# Patient Record
Sex: Female | Born: 1995
Health system: Southern US, Community
[De-identification: ages and names within clinical notes are randomized; demographics above are authoritative.]

## PROBLEM LIST (undated history)

## (undated) ENCOUNTER — Emergency Department (HOSPITAL_COMMUNITY): Admission: EM | Discharge status: Against Medical Advice | Payer: Self-pay

## (undated) DIAGNOSIS — F329 Major depressive disorder, single episode, unspecified: Secondary | ICD-10-CM

## (undated) DIAGNOSIS — F32A Depression, unspecified: Secondary | ICD-10-CM

## (undated) DIAGNOSIS — F1510 Other stimulant abuse, uncomplicated: Secondary | ICD-10-CM

## (undated) DIAGNOSIS — F259 Schizoaffective disorder, unspecified: Secondary | ICD-10-CM

## (undated) DIAGNOSIS — F419 Anxiety disorder, unspecified: Secondary | ICD-10-CM

## (undated) DIAGNOSIS — J45909 Unspecified asthma, uncomplicated: Secondary | ICD-10-CM

## (undated) DIAGNOSIS — F1210 Cannabis abuse, uncomplicated: Secondary | ICD-10-CM

## (undated) DIAGNOSIS — F250 Schizoaffective disorder, bipolar type: Secondary | ICD-10-CM

## (undated) DIAGNOSIS — F319 Bipolar disorder, unspecified: Secondary | ICD-10-CM

---
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
## 2015-03-02 DIAGNOSIS — F312 Bipolar disorder, current episode manic severe with psychotic features: Secondary | ICD-10-CM | POA: Diagnosis present

## 2015-03-02 DIAGNOSIS — F319 Bipolar disorder, unspecified: Secondary | ICD-10-CM

## 2015-06-18 DIAGNOSIS — Z9119 Patient's noncompliance with other medical treatment and regimen: Secondary | ICD-10-CM

## 2015-06-18 DIAGNOSIS — Z91199 Patient's noncompliance with other medical treatment and regimen due to unspecified reason: Secondary | ICD-10-CM

## 2015-07-02 DIAGNOSIS — F1210 Cannabis abuse, uncomplicated: Secondary | ICD-10-CM | POA: Insufficient documentation

## 2015-07-04 DIAGNOSIS — F1410 Cocaine abuse, uncomplicated: Secondary | ICD-10-CM | POA: Diagnosis present

## 2015-09-17 ENCOUNTER — Emergency Department (HOSPITAL_COMMUNITY)
Admission: EM | Admit: 2015-09-17 | Discharge: 2015-09-18 | Disposition: A | Discharge status: Home / Self Care | Attending: Emergency Medicine | Admitting: Emergency Medicine

## 2015-09-17 ENCOUNTER — Encounter (HOSPITAL_COMMUNITY): Payer: Self-pay | Admitting: Emergency Medicine

## 2015-09-17 DIAGNOSIS — F1410 Cocaine abuse, uncomplicated: Secondary | ICD-10-CM | POA: Insufficient documentation

## 2015-09-17 DIAGNOSIS — Z79899 Other long term (current) drug therapy: Secondary | ICD-10-CM | POA: Insufficient documentation

## 2015-09-17 DIAGNOSIS — R102 Pelvic and perineal pain: Secondary | ICD-10-CM | POA: Insufficient documentation

## 2015-09-17 DIAGNOSIS — F1210 Cannabis abuse, uncomplicated: Secondary | ICD-10-CM | POA: Insufficient documentation

## 2015-09-17 DIAGNOSIS — Z0000 Encounter for general adult medical examination without abnormal findings: Secondary | ICD-10-CM

## 2015-09-17 DIAGNOSIS — F17200 Nicotine dependence, unspecified, uncomplicated: Secondary | ICD-10-CM | POA: Insufficient documentation

## 2015-09-17 MED ORDER — AZITHROMYCIN 250 MG PO TABS
1000.0000 mg | ORAL_TABLET | Freq: Once | ORAL | Status: AC
Start: 2015-09-18 — End: 2015-09-18
  Administered 2015-09-18: 1000 mg via ORAL
  Filled 2015-09-17: qty 4

## 2015-09-17 NOTE — Discharge Instructions (Signed)
YOU ARE FOUND STABLE FOR DISCHARGE HOME AND INTO POLICE CUSTODY. A LIST OF REHAB PROVIDERS IS BELOW TO PURSE YOUR REQUEST FOR DRUG REHABILITATION.   Substance Abuse Treatment Programs  Intensive Outpatient Programs [facility]     [address]      [place], [place]                   [phone]       [facility] [address] [place], [place] [phone]  [facility]     (Inpatient and outpatient)     [address]           [phone]    [facility] [phone] (Suboxone and Methadone)  [address]      [place], [place] [address]      [phone]       [address] [place], [place] [phone]  [facility] (Outpatient/Inpatient, Chemical)    (insurance only) [phone]             [facility] (Groups & Residential) [place], [place] [phone]     [facility]     [address]     [place], [place]      [phone]       [facility] (for caregivers and family) [phone] [address] [place], [place] [phone]      Residential Treatment Programs [facility]      [address], [place], [place] [address]  [phone]       [facility] [address] [place], [place] [address] [phone]  [facility]        [phone]       [facility] [phone]  [facility] ([facility]             [address]                                         [place], [place]                                                [phone] or [phone]                               [facility] [place] [address] [place], [id number] [phone]  [facility]    [address] [place] [address]      [place], [place]     [phone]       [facility] [address] [place], [place] [phone]  [facility]   [address]     [place], [place] [address]     [phone]      Admissions: 8am-3pm M-F  [facility] ([facility]) [address] [place], [place] [phone]  [facility]: Residential Program [phone](90 Days)   [place], [place]      [phone] or [phone]     [facility]: [facility] [place], [place] (Walk in Hours over the weekend or by referral)  [facility] [address] [place], [place], [place] [address] [phone]  Crisis Mobile: [facility]:  [phone] (for crisis response 24 hours a day) [place]:      [phone] Outpatient Psychiatry and Counseling  [facility]: Mobile Crisis Management 24 hours:  [phone]  [facility] [employer] sliding scale fee and walk in schedule: M-F 8am-12pm/1pm-3pm [place], [place] [address] [place], [place] [address] [phone]  [facility] (Formerly known as [facility] [employer])- new patient walk-in appointments available Monday - Friday 8am -3pm.          [address] [place], [place] [address] [phone] or crisis line- [facility]/ Intensive Outpatient Therapy Program [place], [place] [address] [place]      [phone] [address] [place], [place] [address]                 [place]   [facility] [phone]. [place], [place] [address]   [employer] [facility] Care          [address] Johnette Abraham  [place], [place] [address]       [phone]  [facility] [address] [place], [place] [address] [phone]  [facility]    [address] [place], [place]  [address]     [place], [place] Joycelyn Man     [name] [address]     [phone]       [facility] [place] [address]  [facility]     [address] [place], [place], [place], [place] [address] [place]     [address]     [place], [place] [address]     [phone]       [facility] [address] [place], [place] [address] [phone] Resources for Temporary Residential Assistance/Crisis [place] [facility]) M-F 8am-3pm   [address] [place], [place] [address]   [phone] Services include: laundry, barbering, support groups, case management, phone  & computer access, showers, AA/NA mtgs, mental health/substance abuse nurse, job skills class, disability information, VA assistance, spiritual classes, etc.   [facility]   [address], [place]     [place]              [employer] (women and children)       [place]. [place], [place] [address] [phone] [email or web address] for application and process Application Required  [facility]   [address]    [place] [address]     [phone]                    [place],  [id number] [id number] application appt.) Application Required  [facility] (  women only)    [place], [place] [address]     [phone]      Intake starts 6pm daily Need valid ID, SSC, & Police report [employer] [address] [place], [place] [phone] Application Required  [employer] (men only)     [place].       [place], [place]       [place] (Pregnant women only) [address] [place], [place]  [facility]      [place] [address].      [place], [place] [address]     [phone]             [facility] [address] [place], [place] 90 day commitment/SA/Application process  [facility](men only)     [address]     [place], [place]       Check-in at [facility] [address] [place], [place] [address] [phone] Men/Women/Women and Children must be there by 7 pm  [place], [place]
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
## 2015-12-01 ENCOUNTER — Encounter (HOSPITAL_COMMUNITY): Payer: Self-pay | Admitting: Nurse Practitioner

## 2015-12-01 ENCOUNTER — Emergency Department (HOSPITAL_COMMUNITY)
Admission: EM | Admit: 2015-12-01 | Discharge: 2015-12-02 | Disposition: A | Discharge status: Other Acute Inpatient Hospital | Attending: Emergency Medicine | Admitting: Emergency Medicine

## 2015-12-01 DIAGNOSIS — R45851 Suicidal ideations: Secondary | ICD-10-CM | POA: Insufficient documentation

## 2015-12-01 DIAGNOSIS — F17200 Nicotine dependence, unspecified, uncomplicated: Secondary | ICD-10-CM | POA: Insufficient documentation

## 2015-12-01 DIAGNOSIS — Z79899 Other long term (current) drug therapy: Secondary | ICD-10-CM | POA: Insufficient documentation

## 2015-12-01 DIAGNOSIS — F22 Delusional disorders: Secondary | ICD-10-CM | POA: Insufficient documentation

## 2015-12-01 HISTORY — DX: Major depressive disorder, single episode, unspecified: F32.9

## 2015-12-01 HISTORY — DX: Depression, unspecified: F32.A

## 2015-12-01 LAB — CBC
HEMATOCRIT: 39.4 % (ref 36.0–46.0)
Hemoglobin: 12.8 g/dL (ref 12.0–15.0)
MCH: 32.7 pg (ref 26.0–34.0)
MCHC: 32.5 g/dL (ref 30.0–36.0)
MCV: 100.5 fL — ABNORMAL HIGH (ref 78.0–100.0)
Platelets: 243 10*3/uL (ref 150–400)
RBC: 3.92 MIL/uL (ref 3.87–5.11)
RDW: 15.1 % (ref 11.5–15.5)
WBC: 5.6 10*3/uL (ref 4.0–10.5)

## 2015-12-01 LAB — COMPREHENSIVE METABOLIC PANEL
ALBUMIN: 4.1 g/dL (ref 3.5–5.0)
ALT: 11 U/L — ABNORMAL LOW (ref 14–54)
ANION GAP: 7 (ref 5–15)
AST: 17 U/L (ref 15–41)
Alkaline Phosphatase: 38 U/L (ref 38–126)
BILIRUBIN TOTAL: 0.6 mg/dL (ref 0.3–1.2)
BUN: 11 mg/dL (ref 6–20)
CO2: 26 mmol/L (ref 22–32)
Calcium: 9.2 mg/dL (ref 8.9–10.3)
Chloride: 106 mmol/L (ref 101–111)
Creatinine, Ser: 0.76 mg/dL (ref 0.44–1.00)
GFR calc non Af Amer: >60 mL/min (ref >60)
GLUCOSE: 102 mg/dL — AB (ref 65–99)
Potassium: 4 mmol/L (ref 3.5–5.1)
SODIUM: 139 mmol/L (ref 135–145)
TOTAL PROTEIN: 7 g/dL (ref 6.5–8.1)

## 2015-12-01 LAB — RAPID URINE DRUG SCREEN, HOSP PERFORMED
Amphetamines: NOT DETECTED
BARBITURATES: NOT DETECTED
BENZODIAZEPINES: NOT DETECTED
COCAINE: NOT DETECTED
OPIATES: NOT DETECTED
Tetrahydrocannabinol: NOT DETECTED

## 2015-12-01 LAB — I-STAT BETA HCG BLOOD, ED (MC, WL, AP ONLY): I-stat hCG, quantitative: <5 m[IU]/mL (ref <5)

## 2015-12-01 LAB — ACETAMINOPHEN LEVEL

## 2015-12-01 LAB — ETHANOL: Alcohol, Ethyl (B): <5 mg/dL (ref <5)

## 2015-12-01 LAB — SALICYLATE LEVEL

## 2015-12-01 MED ORDER — BENZTROPINE MESYLATE 1 MG PO TABS
1.0000 mg | ORAL_TABLET | Freq: Two times a day (BID) | ORAL | Status: DC
  Administered 2015-12-02 (×2): 1 mg via ORAL
  Filled 2015-12-01 (×2): qty 1

## 2015-12-01 MED ORDER — ONDANSETRON HCL 4 MG PO TABS
4.0000 mg | ORAL_TABLET | Freq: Three times a day (TID) | ORAL | Status: DC | PRN
  Administered 2015-12-02: 4 mg via ORAL
  Filled 2015-12-01: qty 1

## 2015-12-01 MED ORDER — IBUPROFEN 400 MG PO TABS: 600.0000 mg | ORAL_TABLET | Freq: Three times a day (TID) | ORAL | Status: DC | PRN

## 2015-12-01 MED ORDER — LORAZEPAM 1 MG PO TABS: 1.0000 mg | ORAL_TABLET | Freq: Three times a day (TID) | ORAL | Status: DC | PRN

## 2015-12-01 MED ORDER — HALOPERIDOL 5 MG PO TABS
5.0000 mg | ORAL_TABLET | Freq: Two times a day (BID) | ORAL | Status: DC
  Administered 2015-12-02: 5 mg via ORAL
  Administered 2015-12-02: 10 mg via ORAL
  Filled 2015-12-01: qty 1
  Filled 2015-12-01: qty 2

## 2015-12-01 MED ORDER — ACETAMINOPHEN 325 MG PO TABS: 650.0000 mg | ORAL_TABLET | ORAL | Status: DC | PRN

## 2015-12-01 MED ORDER — NICOTINE 21 MG/24HR TD PT24
21.0000 mg | MEDICATED_PATCH | Freq: Every day | TRANSDERMAL | Status: DC
  Filled 2015-12-01: qty 1

## 2015-12-01 NOTE — ED Notes (Signed)
She c/o 1 week history of depression. She states she feels like she might not want to be here anymore, and she thought of hurting herself but did not do it. She denies thoughts of harming others. She denies any physical complaints. Reports hx of marijuana and cocaine use but not in past 2 months. She is alert, breathing easily

## 2015-12-01 NOTE — ED Notes (Signed)
Pt belongings given to mother. Belongings include mood ring, 1 set of small pearl appearing earrings and a red charger that pt states belongs to the mother.
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
## 2015-12-01 NOTE — ED Notes (Signed)
Family at bedside. 

## 2015-12-01 NOTE — ED Notes (Signed)
Pt appearing very shaky upon arrival to room B16. Patient belongings at nurse station. Mother arriving at bedside. Sitter at bedside.

## 2015-12-02 ENCOUNTER — Encounter (HOSPITAL_COMMUNITY): Payer: Self-pay | Admitting: *Deleted

## 2015-12-02 ENCOUNTER — Inpatient Hospital Stay (HOSPITAL_COMMUNITY)
Admission: AD | Admit: 2015-12-02 | Discharge: 2015-12-06 | DRG: 885 | Disposition: A | Discharge status: Home / Self Care | Payer: Federal, State, Local not specified - Other | Source: Intra-hospital | Attending: Psychiatry | Admitting: Psychiatry

## 2015-12-02 DIAGNOSIS — F1410 Cocaine abuse, uncomplicated: Secondary | ICD-10-CM | POA: Diagnosis not present

## 2015-12-02 DIAGNOSIS — F17200 Nicotine dependence, unspecified, uncomplicated: Secondary | ICD-10-CM | POA: Diagnosis present

## 2015-12-02 DIAGNOSIS — R45851 Suicidal ideations: Secondary | ICD-10-CM | POA: Diagnosis present

## 2015-12-02 DIAGNOSIS — G4700 Insomnia, unspecified: Secondary | ICD-10-CM | POA: Diagnosis present

## 2015-12-02 DIAGNOSIS — F259 Schizoaffective disorder, unspecified: Secondary | ICD-10-CM | POA: Diagnosis present

## 2015-12-02 DIAGNOSIS — F411 Generalized anxiety disorder: Secondary | ICD-10-CM | POA: Diagnosis present

## 2015-12-02 DIAGNOSIS — Z91199 Patient's noncompliance with other medical treatment and regimen due to unspecified reason: Secondary | ICD-10-CM

## 2015-12-02 DIAGNOSIS — F3160 Bipolar disorder, current episode mixed, unspecified: Principal | ICD-10-CM | POA: Diagnosis present

## 2015-12-02 DIAGNOSIS — F250 Schizoaffective disorder, bipolar type: Secondary | ICD-10-CM | POA: Diagnosis not present

## 2015-12-02 DIAGNOSIS — F319 Bipolar disorder, unspecified: Secondary | ICD-10-CM

## 2015-12-02 DIAGNOSIS — Z9119 Patient's noncompliance with other medical treatment and regimen: Secondary | ICD-10-CM | POA: Diagnosis not present

## 2015-12-02 DIAGNOSIS — F209 Schizophrenia, unspecified: Secondary | ICD-10-CM | POA: Insufficient documentation

## 2015-12-02 DIAGNOSIS — F312 Bipolar disorder, current episode manic severe with psychotic features: Secondary | ICD-10-CM | POA: Diagnosis present

## 2015-12-02 MED ORDER — HALOPERIDOL 2 MG PO TABS
2.0000 mg | ORAL_TABLET | Freq: Two times a day (BID) | ORAL | Status: DC
  Administered 2015-12-02 – 2015-12-06 (×8): 2 mg via ORAL
  Filled 2015-12-02 (×11): qty 1
  Filled 2015-12-02 (×2): qty 14
  Filled 2015-12-02 (×3): qty 1

## 2015-12-02 MED ORDER — TRAZODONE HCL 50 MG PO TABS
50.0000 mg | ORAL_TABLET | Freq: Every evening | ORAL | Status: DC | PRN
  Administered 2015-12-02 – 2015-12-05 (×4): 50 mg via ORAL
  Filled 2015-12-02 (×3): qty 1
  Filled 2015-12-02: qty 7
  Filled 2015-12-02: qty 1

## 2015-12-02 MED ORDER — ALUM & MAG HYDROXIDE-SIMETH 200-200-20 MG/5ML PO SUSP: 30.0000 mL | ORAL | Status: DC | PRN

## 2015-12-02 MED ORDER — BENZTROPINE MESYLATE 1 MG PO TABS
1.0000 mg | ORAL_TABLET | Freq: Every day | ORAL | Status: DC
  Administered 2015-12-02 – 2015-12-05 (×4): 1 mg via ORAL
  Filled 2015-12-02 (×7): qty 1

## 2015-12-02 MED ORDER — HYDROXYZINE HCL 25 MG PO TABS
25.0000 mg | ORAL_TABLET | Freq: Four times a day (QID) | ORAL | Status: DC | PRN
  Administered 2015-12-02 – 2015-12-05 (×3): 25 mg via ORAL
  Filled 2015-12-02: qty 1
  Filled 2015-12-02: qty 10
  Filled 2015-12-02 (×2): qty 1

## 2015-12-02 MED ORDER — MAGNESIUM HYDROXIDE 400 MG/5ML PO SUSP: 30.0000 mL | Freq: Every day | ORAL | Status: DC | PRN

## 2015-12-02 NOTE — Progress Notes (Signed)
Seeking inpatient psychiatric treatment for pt at recommendation of TTS. Considered [facility] and [facility], [facility], [facility] pending appropriate bed availability- [facility]  Ref[facility] to: [facility]- [facility]- per Dorina HoyerJoanie (notes only 2 beds opening today and 50+ referrals) [facility]-per [facility]is [facility]- per Chrissy  [facility], [facility], and [facility]- will refer if appropriate.  [facility] at capacity.  Ilean SkillMeghan Aunika Kirsten, MSW, LCSW Clinical Social Work, Disposition  12/02/2015 [phone]

## 2015-12-02 NOTE — ED Notes (Signed)
Gave pt Malaw[place]key sandwich and cookies, per Chrislyn - RN.

## 2015-12-02 NOTE — Tx Team (Signed)
Initial Interdisciplinary Treatment Plan   PATIENT STRESSORS: Legal issue Substance abuse   PATIENT STRENGTHS: Barrister's clerkCommunication skills Motivation for treatment/growth Physical Health Supportive family/friends   PROBLEM LIST: Problem List/Patient Goals Date to be addressed Date deferred Reason deferred Estimated date of resolution  Depression 12/02/2015  12/02/2015   D/C  Psychosis 12/02/2015  12/02/2015   D/C  Substance Abuse 12/02/2015  12/02/2015   D/C  "Not being depressed" 12/02/2015  12/02/2015   D/C  "Stop my panic attacks" 12/02/2015  12/02/2015   D/C                           DISCHARGE CRITERIA:  Adequate post-discharge living arrangements Improved stabilization in mood, thinking, and/or behavior Motivation to continue treatment in a less acute level of care Need for constant or close observation no longer present Reduction of life-threatening or endangering symptoms to within safe limits  PRELIMINARY DISCHARGE PLAN: Outpatient therapy Placement in alternative living arrangements  PATIENT/FAMIILY INVOLVEMENT: This treatment plan has been presented to and reviewed with the patient, Yvonne C Borras.  The patient and family have been given the opportunity to ask questions and make suggestions.  Larry SierrasMiddleton, Verlena Marlette P 12/02/2015, 8:40 PM

## 2015-12-02 NOTE — ED Notes (Signed)
Caller reporting to be pt's mother called to speak with pt.  Pt had questions re. Bed availability and insurance and verbally gave this RN permission to discuss with caller.  This RN referred caller to [facility]egan [facility] and gave phone number.

## 2015-12-02 NOTE — ED Notes (Signed)
Pt received lunch tray 

## 2015-12-02 NOTE — ED Notes (Signed)
Breakfast ordered 

## 2015-12-02 NOTE — ED Notes (Signed)
[facility] will accept the pt for in-patient admit

## 2015-12-02 NOTE — ED Notes (Signed)
Sitter given break by this Charity fundraiserN.

## 2015-12-02 NOTE — Progress Notes (Signed)
Admission Note:  20 yr old female who presents voluntary, in no acute distress, for the treatment of Depression and Paranoia. Patient reports paranoia stating "I'm paranoid. I keep thinking that people are out to get me".  Patient also reports OCD behavior for the past "3-4 months" stating "I keep thinking that if I lay a certain way then people are going to die.  If I lay to the right my mom will die, if I lay to the left my grandma will die, and if I lay on my back, my dad will die".  Patient appears anxious and depressed. Patient was calm and cooperative with admission process. Patient denies SI on admission and contracts for safety upon admission. Patient denies AVH.  Patient reports that prior to admission she was having SI without a plan.  Patient reports insomnia.  Patient reports hx of "sniffing cocaine" and marijuana use with last use of both being on May 11th.  Patient reports multiple stressors to include legal issues, "paranoia", "Insomnia" and "OCD behaviors".  Patient is currently on house arrests and has a monitor on her left ankle.  Patient reports that she was in jail from Sep 30, 2015-November 08, 2015 for "violating curfew".  Patient's main concern is finding a place to stay upon discharge.  Patient reports that she would like to go home with her parents but reports that there is a court order for her not to be able to return there.  Patient reports that once she got out of jail on June 19th, [facility], and [facility] due to her "paranoia".  She was able to [facility] and then got "kicked out" of [facility] again for similar behavior and came to the hospital for help.  Patient is unsure of discharge plans.  [facility], patient would like to work on "not being depressed" and "stopping my panic attacks".  Skin was assessed and found to be clear of any abnormal marks apart from  multiple tattoos.  Patient searched and no contraband found, POC and unit policies explained and understanding verbalized. Consents obtained.  Report given to accepting nurse.  Patient placed on q 15 minute safety checks.  Patient had no additional questions or concerns.

## 2015-12-02 NOTE — ED Notes (Signed)
[facility]

## 2015-12-02 NOTE — Progress Notes (Signed)
Christiane HaJonathan at [facility] called stating pt's referral is being reviewed and advised that "our billing team called [employer] to verify her coverage and were told that her coverage expired in 2014, that she is eligible to reinstate it but would have to initiate that process." Asked if pt has other coverage. Per ED RN- pt provides verbal consent to contact father Belenda CruiseRobert Parera [phone] to clarify insurance status. CSW left voicemail requesting returned call. Christiane HaJonathan at [facility] states pt will be [facility] waiting list until it is discovered pt has active coverage.   Ilean SkillMeghan Thatcher Doberstein, MSW, LCSW Clinical Social Work, Disposition  12/02/2015 [phone]

## 2015-12-02 NOTE — ED Notes (Signed)
Pt asking for more food.  The sitter reports that the pt had food from the outside at 2030 and was given a sandwich by the offgoing nurse approx one hour ago.  The pt was told that she needed to sleep  She could have water but nothing else.  Her breakfast was coming in 3-4 hours

## 2015-12-02 NOTE — Progress Notes (Signed)
Pt attended karaoke this evening.  

## 2015-12-02 NOTE — ED Notes (Signed)
Per Aundra MilletMegan, [facility] Accepting: Dr. Jama Flavorsobos Bed:  504-1 Report to be called to [id number] Bed will be ready at 1730
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
## 2015-12-03 DIAGNOSIS — F209 Schizophrenia, unspecified: Secondary | ICD-10-CM

## 2015-12-03 LAB — COMPREHENSIVE METABOLIC PANEL
ALK PHOS: 39 U/L (ref 38–126)
ALT: 10 U/L — ABNORMAL LOW (ref 14–54)
ANION GAP: 7 (ref 5–15)
AST: 13 U/L — ABNORMAL LOW (ref 15–41)
Albumin: 4.1 g/dL (ref 3.5–5.0)
BILIRUBIN TOTAL: 0.5 mg/dL (ref 0.3–1.2)
BUN: 15 mg/dL (ref 6–20)
CALCIUM: 9 mg/dL (ref 8.9–10.3)
CO2: 26 mmol/L (ref 22–32)
Chloride: 104 mmol/L (ref 101–111)
Creatinine, Ser: 0.63 mg/dL (ref 0.44–1.00)
Glucose, Bld: 86 mg/dL (ref 65–99)
POTASSIUM: 4 mmol/L (ref 3.5–5.1)
Sodium: 137 mmol/L (ref 135–145)
Total Protein: 6.8 g/dL (ref 6.5–8.1)

## 2015-12-03 LAB — HEPATIC FUNCTION PANEL
ALT: 10 U/L — ABNORMAL LOW (ref 14–54)
AST: 12 U/L — AB (ref 15–41)
Albumin: 4.1 g/dL (ref 3.5–5.0)
Alkaline Phosphatase: 38 U/L (ref 38–126)
BILIRUBIN TOTAL: 0.8 mg/dL (ref 0.3–1.2)
Total Protein: 6.9 g/dL (ref 6.5–8.1)

## 2015-12-03 LAB — LIPID PANEL
Cholesterol: 121 mg/dL (ref 0–200)
HDL: 48 mg/dL (ref >40)
LDL CALC: 50 mg/dL (ref 0–99)
TRIGLYCERIDES: 114 mg/dL (ref <150)
Total CHOL/HDL Ratio: 2.5 RATIO
VLDL: 23 mg/dL (ref 0–40)

## 2015-12-03 LAB — PREGNANCY, URINE: PREG TEST UR: NEGATIVE

## 2015-12-03 MED ORDER — LAMOTRIGINE 25 MG PO TABS
25.0000 mg | ORAL_TABLET | Freq: Every day | ORAL | Status: DC
  Administered 2015-12-03 – 2015-12-06 (×4): 25 mg via ORAL
  Filled 2015-12-03: qty 1
  Filled 2015-12-03: qty 7
  Filled 2015-12-03 (×5): qty 1

## 2015-12-03 NOTE — [facility] Group Notes (Signed)
[facility] LCSW Group Therapy 12/03/2015 1:15pm  Type of Therapy: Group Therapy- Feelings Around Relapse and Recovery  Participation Level: Active   Participation Quality:  Appropriate  Affect:  Appropriate  Cognitive: Alert and Oriented   Insight:  Developing   Engagement in Therapy: Developing/Improving and Engaged   Modes of Intervention: Clarification, Confrontation, Discussion, Education, Exploration, Limit-setting, Orientation, Problem-solving, Rapport Building, Dance movement psychotherapisteality Testing, Socialization and Support  Summary of Progress/Problems: The topic for today was feelings about relapse. The group discussed what relapse prevention is to them and identified triggers that they are on the path to relapse. Members also processed their feeling towards relapse and were able to relate to common experiences. Group also discussed coping skills that can be used for relapse prevention.  Pt expressed feeling hopeless because she is not allowed to return home. Pt has difficulty recognizing her ability to see that there will be a time when she can return home yet she feels like committing suicide in the moment. Pt appears distressed about this situation.    Therapeutic Modalities:   Cognitive Behavioral Therapy Solution-Focused Therapy Assertiveness Training Relapse Prevention Therapy    Lamar SprinklesLauren Carter, LCSWA [phone] 12/03/2015 3:33 PM

## 2015-12-03 NOTE — Progress Notes (Signed)
Nursing Note: 0700-1900  D:  Pt verbalizes that she finally slept well last night and feels safe here.  "I just really miss my mother and my family, I hope I can go home.  Thinking about going to a shelter just stresses me more."  Pt. rates depression 7/10, hopelessness 0/10 and anxiety 5/10. Pt's goal was attend group today.  A:  Encouraged to verbalize needs and concerns, active listening and support provided.  Continued Q 15 minute safety checks.  Pt received charger for ankle bracelet, pt observed while device charging.  Cord left at nurses station. Pt observed actively participating in group.  R:  Pt. Is cooperative, denies A/V hallucinations and is able to verbally contract for safety. Pt states that paranoia is better today.

## 2015-12-03 NOTE — [facility] Suicide Risk Assessment (Addendum)
[facility] Admission Suicide Risk Assessment   Nursing information obtained from:  Patient Demographic factors:  Adolescent or young adult, Unemployed Current Mental Status:  Suicidal ideation indicated by patient, Self-harm thoughts Loss Factors:  Loss of significant relationship, Legal issues Historical Factors:  Impulsivity Risk Reduction Factors:  Positive social support  Total Time spent with patient: 45 minutes Principal Problem: Diagnosis:   Patient Active Problem List   Diagnosis Date Noted  . Schizophrenia (HCC) [F20.9] 12/02/2015     Continued Clinical Symptoms:  Alcohol Use Disorder Identification Test Final Score (AUDIT): 0 The "Alcohol Use Disorders Identification Test", Guidelines for Use in Primary Care, Second Edition.  World Science writer [facility]). Score between 0-7:  no or low risk or alcohol related problems. Score between 8-15:  moderate risk of alcohol related problems. Score between 16-19:  high risk of alcohol related problems. Score 20 or above:  warrants further diagnostic evaluation for alcohol dependence and treatment.   CLINICAL FACTORS:  20 year old single  female , no children, recently incarcerated . No current source of income . She has recently been living  at [facility] since mid June, after being released from jail. She  states she has been feeling progressively more depressed , mainly because she misses her family,mother. States she had developed suicidal ideations, with thoughts of cutting herself , but did not attempt.  She states she was also feeling paranoid that people at [facility] were going to hurt her and that they " looked weird". Denies hallucinations. She told staff and was brought to hospital. Has has several prior psychiatric admissions since age 35. As per chart , she has been diagnosed with Schizophrenia in the past, but states her major symptomatology is depression . One suicidal attempt at age 62. Denies history of  violence. Describes panic , anxiety symptoms, usually coinciding with depression. Denies agoraphobia. Denies history of auditory hallucinations . Reports history of cannabis dependence, history of cocaine use ( isolated, denies pattern of dependence) , denies alcohol abuse or IVDA. Most recently was prescribed Cogentin, Haldol, Depakote, Abilify ( had been refusing Depakote because she felt it was causing her to feel paranoid.  Dx- Schizophrenia by history, consider also Schizoaffective Disorder, depressed, history of Cannabis Dependence   Plan - inpatient admission Continue Haldol, Cogentin. We discussed other medication options, agrees to Lamictal for management of mood disorder, depression .         Musculoskeletal: Strength & Muscle Tone: within normal limits Gait & Station: normal Patient leans: [name]  Psychiatric Specialty Exam: Physical Exam  ROS no headache, no chest pain , some back pain, no vomiting, no rash, no fever, no chills   Blood pressure 105/58, pulse 103, temperature 98.6 F (37 C), temperature source Oral, resp. rate 16, height  (1.854 m), weight 194 lb (87.998 kg), last menstrual period 11/17/2015.Body mass index is 25.6 kg/(m^2).  General Appearance: Fairly Groomed  Eye Contact:  Good  Speech:  Normal Rate- monotone   Volume:  Decreased  Mood:  Depressed  Affect:  constricted , sad   Thought Process:  Linear- currently no thought disorder noted   Orientation:  Other:  alert and attentive   Thought Content:  denies any hallucinations and does not appear internally preoccupied , states she feels paranoid, but no actual delusions expressed  at this time   Suicidal Thoughts:  No- denies any suicidal ideations at this time, and contracts for safety on the unit at this time  Homicidal Thoughts:  No denies any homicidal ideations  Memory:  recent and remote grossly intact   Judgement:  Fair  Insight:  Fair  Psychomotor Activity:  Decreased  Concentration:   Concentration: Good and Attention Span: Good  Recall:  Good  Fund of Knowledge:  Good  Language:  Good  Akathisia:  Negative  Handed:  Right  AIMS (if indicated):     Assets:  Communication Skills Desire for Improvement Resilience  ADL's:  Intact  Cognition:  WNL  Sleep:  Number of Hours: 6.75      COGNITIVE FEATURES THAT CONTRIBUTE TO RISK:  Loss of executive function   SUICIDE RISK:   Moderate:  Frequent suicidal ideation with limited intensity, and duration, some specificity in terms of plans, no associated intent, good self-control, limited dysphoria/symptomatology, some risk factors present, and identifiable protective factors, including available and accessible social support.  PLAN OF CARE: Patient will be admitted to inpatient psychiatric unit for stabilization and safety. Will provide and encourage milieu participation. Provide medication management and maked adjustments as needed.  Will follow daily.    I certify that inpatient services furnished can reasonably be expected to improve the patient's condition.   Nehemiah MassedOBOS, FERNANDO, MD 12/03/2015, 1:43 PM

## 2015-12-03 NOTE — Tx Team (Signed)
Interdisciplinary Treatment Plan Update (Adult) Date: 12/03/2015   Date: 12/03/2015 9:44 AM  Progress in Treatment:  Attending groups: Pt is new to milieu, continuing to assess  Participating in groups: Pt is new to milieu, continuing to assess  Taking medication as prescribed: Yes  Tolerating medication: Yes  Family/Significant othe contact made: No, CSW assessing for appropriate contact Patient understands diagnosis: Continuing to assess Discussing patient identified problems/goals with staff: Yes  Medical problems stabilized or resolved: Yes  Denies suicidal/homicidal ideation: Denied upon admission to the unit Patient has not harmed self or Others: Yes   New problem(s) identified: None identified at this time.   Discharge Plan or Barriers: CSW will assess for appropriate discharge plan and relevant barriers.   Additional comments:  Patient and CSW reviewed pt's identified goals and treatment plan. Patient verbalized understanding and agreed to treatment plan.   Reason for Continuation of Hospitalization:  Anxiety Depression Medication stabilization Suicidal ideation Psychosis- Paranoia  Estimated length of stay: 5-7 days  Review of initial/current patient goals per problem list:   1.  Goal(s): Patient will participate in aftercare plan  Met:  No  Target date: 3-5 days from date of admission   As evidenced by: Patient will participate within aftercare plan AEB aftercare provider and housing plan at discharge being identified.  12/03/15: CSW to work with Pt to assess for appropriate discharge plan and faciliate appointments and referrals as needed prior to d/c.  2.  Goal (s): Patient will exhibit decreased depressive symptoms and suicidal ideations.  Met:  No  Target date: 3-5 days from date of admission   As evidenced by: Patient will utilize self rating of depression at 3 or below and demonstrate decreased signs of depression or be deemed stable for discharge by  MD. 12/03/15: Pt was admitted with symptoms of depression, rating 10/10. Pt continues to present with flat affect and depressive symptoms.  Pt will demonstrate decreased symptoms of depression and rate depression at 3/10 or lower prior to discharge.  3.  Goal(s): Patient will demonstrate decreased signs and symptoms of anxiety.  Met:  No  Target date: 3-5 days from date of admission   As evidenced by: Patient will utilize self rating of anxiety at 3 or below and demonstrated decreased signs of anxiety, or be deemed stable for discharge by MD 12/03/15: Pt was admitted with increased levels of anxiety and is currently rating those symptoms highly. Pt will demonstrated decreased symptoms of anxiety and rate it at 3/10 prior to d/c.  5.  Goal(s): Patient will demonstrate decreased signs of psychosis  . Met:  No . Target date: 3-5 days from date of admission  . As evidenced by: Patient will demonstrate decreased frequency of AVH or return to baseline function - 12/03/15: Pt admitted with paranoid thoughts, feeling that people are trying to kill her.  Attendees:  Patient:    Family:    Physician: Dr. Parke Poisson, MD  12/03/2015 9:44 AM  Nursing: Freda Munro RN; Idell Pickles, RN 12/03/2015 9:44 AM  Clinical Social Worker Peri Maris, [place] 12/03/2015 9:44 AM  Other: Tilden Fossa, LCSWA 12/03/2015 9:44 AM  Clinical: Lars Pinks, RN Case manager  12/03/2015 9:44 AM  Other:  12/03/2015 9:44 AM  Other:     Peri Maris, [place] Social Work [phone]

## 2015-12-03 NOTE — H&P (Signed)
Psychiatric Admission Assessment Adult  Patient Identification: Amy [name] MRN:  [id number] Date of Evaluation:  12/03/2015 Chief Complaint:  SCHIZOAFFECTIVE DISORDER Principal Diagnosis: <principal problem not specified> Diagnosis:   Patient Active Problem List   Diagnosis Date Noted  . Schizophrenia ([place]) [F20.9] 12/02/2015   History of Present Illness: Amy [name] is an 20 y.o. Female admitted to [facility] with reports history of depression and Schizophrenia. Pt reports compliance with Haldol, Cogentin, and Abilify. Pt reports suicidal ideation with intent and undisclosed plan.  Pt reports history of one suicidal gesture during which she held a knife to her stomach with thoughts of stabbing herself.  She was recently at [facility] and was in jail just before that.  She has had several prior psychiatric admissions since age 56. As per chart , she has been diagnosed with Schizophrenia in the past, but states her major symptomatology is depression .  Pt denies history of self-harm however, reports active thoughts and urges to harm self. Pt denies homicidal ideation and thoughts of harm towards others.  Patient had thoughts of  "people are out to get me" and "trying to kill me". Pt reports thoughts of killing herself before someone else kills her. Pt states "I'm just not myself".   Associated Signs/Symptoms: Depression Symptoms:  depressed mood, (Hypo) Manic Symptoms:  Labiality of Mood, Anxiety Symptoms:  Excessive Worry, Psychotic Symptoms:  denies PTSD Symptoms: NA Total Time spent with patient: 45 minutes  Past Psychiatric History: see HPI  Is the patient at risk to self? Yes.    Has the patient been a risk to self in the past 6 months? Yes.    Has the patient been a risk to self within the distant past? Yes.    Is the patient a risk to others? No.  Has the patient been a risk to others in the past 6 months? No.  Has the patient been a risk to others within the  distant past? No.   Prior Inpatient Therapy:   Prior Outpatient Therapy:    Alcohol Screening: 1. How often do you have a drink containing alcohol?: Never 9. Have you or someone else been injured as a result of your drinking?: No 10. Has a relative or friend or a doctor or another health worker been concerned about your drinking or suggested you cut down?: No Alcohol Use Disorder Identification Test Final Score (AUDIT): 0 Brief Intervention: AUDIT score less than 7 or less-screening does not suggest unhealthy drinking-brief intervention not indicated Substance Abuse History in the last 12 months:  Yes.   Consequences of Substance Abuse: NA Previous Psychotropic Medications: Yes  Psychological Evaluations: Yes  Past Medical History:  Past Medical History  Diagnosis Date  . Depression    History reviewed. No pertinent past surgical history. Family History: History reviewed. No pertinent family history. Family Psychiatric  History: see HPI Tobacco Screening: _0 ([phone])::1)@ Social History:  History  Alcohol Use No     History  Drug Use  . Yes  . Special: Cocaine, Marijuana    Comment: denies    Additional Social History:      Allergies:   Allergies  Allergen Reactions  . Depakote [Divalproex Sodium] Other (See Comments)    Creates feelings of paranoia, some suicidal feelings, and makes her feel that "people are coming after" her  . Valproic Acid Other (See Comments)    Creates paranoia/Per [facility]  . Penicillins Rash    Has patient had a PCN reaction causing  immediate rash, facial/tongue/throat swelling, SOB or lightheadedness with hypotension: Yes Has patient had a PCN reaction causing severe rash involving mucus membranes or skin necrosis: No Has patient had a PCN reaction that required hospitalization: No Has patient had a PCN reaction occurring within the last 10 years: Yes If all of the above answers are "NO", then may    Lab Results:  Results for  orders placed or performed during the hospital encounter of 12/02/15 (from the past 48 hour(s))  Pregnancy, urine     Status: None   Collection Time: 12/02/15  9:18 PM  Result Value Ref Range   Preg Test, Ur NEGATIVE NEGATIVE    Comment:        THE SENSITIVITY OF THIS METHODOLOGY IS >20 mIU/mL. Performed at [facility]   Comprehensive metabolic panel     Status: Abnormal   Collection Time: 12/03/15  6:10 AM  Result Value Ref Range   Sodium 137 135 - 145 mmol/L   Potassium 4.0 3.5 - 5.1 mmol/L   Chloride 104 101 - 111 mmol/L   CO2 26 22 - 32 mmol/L   Glucose, Bld 86 65 - 99 mg/dL   BUN 15 6 - 20 mg/dL   Creatinine, Ser 0.63 0.44 - 1.00 mg/dL   Calcium 9.0 8.9 - 10.3 mg/dL   Total Protein 6.8 6.5 - 8.1 g/dL   Albumin 4.1 3.5 - 5.0 g/dL   AST 13 (L) 15 - 41 U/L   ALT 10 (L) 14 - 54 U/L   Alkaline Phosphatase 39 38 - 126 U/L   Total Bilirubin 0.5 0.3 - 1.2 mg/dL   GFR calc non Af Amer >60 >60 mL/min   GFR calc Af Amer >60 >60 mL/min    Comment: (NOTE) The eGFR has been calculated using the CKD EPI equation. This calculation has not been validated in all clinical situations. eGFR's persistently <60 mL/min signify possible Chronic Kidney Disease.    Anion gap 7 5 - 15    Comment: Performed at [facility]  Hepatic function panel     Status: Abnormal   Collection Time: 12/03/15  6:10 AM  Result Value Ref Range   Total Protein 6.9 6.5 - 8.1 g/dL   Albumin 4.1 3.5 - 5.0 g/dL   AST 12 (L) 15 - 41 U/L   ALT 10 (L) 14 - 54 U/L   Alkaline Phosphatase 38 38 - 126 U/L   Total Bilirubin 0.8 0.3 - 1.2 mg/dL   Bilirubin, Direct <0.1 (L) 0.1 - 0.5 mg/dL   Indirect Bilirubin NOT CALCULATED 0.3 - 0.9 mg/dL    Comment: Performed at [facility]    Blood Alcohol level:  Lab Results  Component Value Date   [facility] <5 02/77/4128    Metabolic Disorder Labs:  No results found for: HGBA1C, MPG No results found for: PROLACTIN No  results found for: CHOL, TRIG, HDL, CHOLHDL, VLDL, LDLCALC  Current Medications: Current Facility-Administered Medications  Medication Dose Route Frequency Provider Last Rate Last Dose  . alum & mag hydroxide-simeth (MAALOX/MYLANTA) 200-200-20 MG/5ML suspension 30 mL  30 mL Oral Q4H PRN Benjamine Mola, FNP      . benztropine (COGENTIN) tablet 1 mg  1 mg Oral QHS Benjamine Mola, FNP   1 mg at 12/02/15 2141  . haloperidol (HALDOL) tablet 2 mg  2 mg Oral BID Benjamine Mola, FNP   2 mg at 12/03/15 0813  . hydrOXYzine (ATARAX/VISTARIL) tablet 25 mg  25 mg Oral Q6H PRN Benjamine Mola, FNP   25 mg at 12/02/15 2141  . magnesium hydroxide (MILK OF MAGNESIA) suspension 30 mL  30 mL Oral Daily PRN Benjamine Mola, FNP      . traZODone (DESYREL) tablet 50 mg  50 mg Oral QHS PRN Benjamine Mola, FNP   50 mg at 12/02/15 2141   PTA Medications: Prescriptions prior to admission  Medication Sig Dispense Refill Last Dose  . benztropine (COGENTIN) 1 MG tablet Take 1 mg by mouth 2 (two) times daily.   11/30/2015 at pm  . haloperidol (HALDOL) 5 MG tablet Take 5-10 mg by mouth 2 (two) times daily. 5 mg in the morning and 10 mg at bedtime   11/30/2015 at pm  . haloperidol decanoate (HALDOL DECANOATE) 100 MG/ML injection Inject 100 mg into the muscle every 30 (thirty) days.   1.5 month ago    Musculoskeletal: Strength & Muscle Tone: within normal limits Gait & Station: normal Patient leans: [name]  Psychiatric Specialty Exam: Physical Exam  Vitals reviewed. Psychiatric: Her mood appears anxious.    Review of Systems  Psychiatric/Behavioral: The patient is nervous/anxious.   All other systems reviewed and are negative.   Blood pressure 105/58, pulse 103, temperature 98.6 F (37 C), temperature source Oral, resp. rate 16, height _0  (1.854 m), weight 87.998 kg (194 lb), last menstrual period 11/17/2015.Body mass index is 25.6 kg/(m^2).  General Appearance: Fairly Groomed  Eye Contact:  Good  Speech:  Normal  Rate  Volume:  Decreased  Mood:  Depressed  Affect:  Constricted  Thought Process:  Linear  Orientation:  Full (Time, Place, and Person)  Thought Content:  Rumination  Suicidal Thoughts:  No  Homicidal Thoughts:  No  Memory:  Immediate;   Fair Recent;   Fair Remote;   Fair  Judgement:  Fair  Insight:  Fair  Psychomotor Activity:  Decreased  Concentration:  Concentration: Good and Attention Span: Good  Recall:  Good  Fund of Knowledge:  Good  Language:  Good  Akathisia:  Negative  Handed:  Right  AIMS (if indicated):     Assets:  Desire for Improvement Physical Health Resilience Social Support  ADL's:  Intact  Cognition:  WNL  Sleep:  Number of Hours: 6.75   Treatment Plan Summary: Admit for crisis management and mood stabilization. Medication management to re-stabilize current mood symptoms.  Continue Haldol, Cogentin. We discussed other medication options, agrees to Lamictal for management of mood disorder, depression . Group counseling sessions for coping skills Medical consults as needed Review and reinstate any pertinent home medications for other health problems   Observation Level/Precautions:  15 minute checks  Laboratory:  per ED  Psychotherapy:  group  Medications:  As per medlist  Consultations:  As needed  Discharge Concerns:  safety  Estimated LOS:  2-7 days  Other:     I certify that inpatient services furnished can reasonably be expected to improve the patient's condition.    [facility], NP [facility] 7/14/201710:32 AM  I have discussed case with NP and have met with patient  Agree with NP note and assessment  20 year old single female , no children, recently incarcerated . No current source of income . She has recently been living at [facility] since mid June, after being released from jail. She states she has been feeling progressively more depressed , mainly because she misses her family,mother. States she had developed  suicidal ideations, with  thoughts of cutting herself , but did not attempt. She states she was also feeling paranoid that people at [facility] were going to hurt her and that they " looked weird". Denies hallucinations. She told staff and was brought to hospital. Has has several prior psychiatric admissions since age 53. As per chart , she has been diagnosed with Schizophrenia in the past, but states her major symptomatology is depression . One suicidal attempt at age 35. Denies history of violence. Describes panic , anxiety symptoms, usually coinciding with depression. Denies agoraphobia. Denies history of auditory hallucinations . Reports history of cannabis dependence, history of cocaine use ( isolated, denies pattern of dependence) , denies alcohol abuse or IVDA. Most recently was prescribed Cogentin, Haldol, Depakote, Abilify ( had been refusing Depakote because she felt it was causing her to feel paranoid.  Dx- Schizophrenia by history, consider also Schizoaffective Disorder, depressed, history of Cannabis Dependence   Plan - inpatient admission Continue Haldol, Cogentin. We discussed other medication options, agrees to Lamictal for management of mood disorder, depression .

## 2015-12-03 NOTE — Progress Notes (Signed)
Adult Psychoeducational Group Note  Date:  12/03/2015 Time:  8:45 PM  Group Topic/Focus:  Wrap-Up Group:   The focus of this group is to help patients review their daily goal of treatment and discuss progress on daily workbooks.  Participation Level:  Active  Participation Quality:  Appropriate  Affect:  Appropriate  Cognitive:  Appropriate  Insight: Appropriate  Engagement in Group:  Engaged  Modes of Intervention:  Discussion  Additional Comments: The patient expressed that she attended group.The patient also said that he rates his day a 5.  Octavio Mannshigpen, Eleisha Branscomb Lee 12/03/2015, 8:45 PM

## 2015-12-04 ENCOUNTER — Encounter (HOSPITAL_COMMUNITY): Payer: Self-pay | Admitting: Registered Nurse

## 2015-12-04 DIAGNOSIS — R45851 Suicidal ideations: Secondary | ICD-10-CM

## 2015-12-04 DIAGNOSIS — G4700 Insomnia, unspecified: Secondary | ICD-10-CM

## 2015-12-04 DIAGNOSIS — F411 Generalized anxiety disorder: Secondary | ICD-10-CM

## 2015-12-04 DIAGNOSIS — F3160 Bipolar disorder, current episode mixed, unspecified: Secondary | ICD-10-CM | POA: Insufficient documentation

## 2015-12-04 LAB — HEMOGLOBIN A1C
Hgb A1c MFr Bld: 4.8 % (ref 4.8–5.6)
Mean Plasma Glucose: 91 mg/dL

## 2015-12-04 LAB — PROLACTIN: PROLACTIN: 66.9 ng/mL — AB (ref 4.8–23.3)

## 2015-12-04 MED ORDER — IBUPROFEN 600 MG PO TABS
600.0000 mg | ORAL_TABLET | Freq: Three times a day (TID) | ORAL | Status: DC | PRN
  Administered 2015-12-04 – 2015-12-05 (×2): 600 mg via ORAL
  Filled 2015-12-04 (×2): qty 1

## 2015-12-04 MED ORDER — ACETAMINOPHEN 325 MG PO TABS: 650.0000 mg | ORAL_TABLET | Freq: Three times a day (TID) | ORAL | Status: DC | PRN

## 2015-12-04 NOTE — [facility] Group Notes (Signed)
[facility] LCSW Group Therapy Note  12/04/2015 at 11:10 AM  Type of Therapy and Topic:  Group Therapy: Avoiding Self-Sabotaging and Enabling Behaviors  Participation Level:  Minimal  Participation Quality:  Attentive  Affect:  Flat  Cognitive:  Alert  Insight:  None noted  Engagement in Therapy:  None noted   Therapeutic models used: Cognitive Behavioral Therapy,  Person-Centered Therapy and Motivational Interviewing  Modes of Intervention:  Discussion, Exploration, Orientation, Rapport Building, Socialization and Support   Summary of Progress/Problems:  The main focus of today's process group was for the patient to identify ways in which they have in the past perhaps sabotaged their own recovery. Motivational Interviewing was utilized to identify what might be a sign they were getting better and what might motivate them to become more engaged with change. Patient shared that she is missing her family and anxious to return to them. Patient was unable to relate to change during f=group time today. Carney Bern.    Krishana Lutze C Zaineb Nowaczyk, LCSW
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
## 2015-12-04 NOTE — Progress Notes (Signed)
Adult Psychoeducational Group Note  Date:  12/04/2015 Time:  8:57 PM  Group Topic/Focus:  Wrap-Up Group:   The focus of this group is to help patients review their daily goal of treatment and discuss progress on daily workbooks.  Participation Level:  Active  Participation Quality:  Appropriate  Affect:  Appropriate  Cognitive:  Appropriate  Insight: Appropriate  Engagement in Group:  Engaged  Modes of Intervention:  Discussion  Additional Comments:  The patient expressed that she attended groups.The patient also said that she rates today a 6.  Octavio Mannshigpen, Aneesah Hernan Lee 12/04/2015, 8:57 PM
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
## 2015-12-05 NOTE — Progress Notes (Signed)
[facility] Group Notes:  (Nursing/MHT/Case Management/Adjunct)  Date:  12/05/2015  Time:  8:46 PM  Type of Therapy:  Psychoeducational Skills  Participation Level:  Minimal  Participation Quality:  Resistant  Affect:  Depressed and Flat  Cognitive:  Lacking  Insight:  Limited  Engagement in Group:  Resistant  Modes of Intervention:  Education  Summary of Progress/Problems: The patient appeared flat and sullen in group. She indicated that she had a bad day but did not divulge any additional details. The patient verbalized that she was visited by her mother this evening. In terms of the theme for the day, her support system will be comprised of her mother.   Hazle CocaGOODMAN, Akil Hoos S 12/05/2015, 8:46 PM

## 2015-12-05 NOTE — Progress Notes (Signed)
[facility]  12/05/2015 11:53 AM Amy [name]  MRN:  [id number]    Subjective:  "I'm feeling better" Patient seen by this provider and chart reviewed 12/05/2015.  On evaluation:  Amy [name] reports that she is feeling much better; states that she talked with her aunt who is coming to see her today and her mother is coming to see her tomorrow.  States that she is not sleeping well but it is related to the environment and not feeling comfortable.  States that she is eating without difficulty and tolerating medications without adverse reaction "The Lamictal is working."  Rates her depression 5/10, Anxiety 5/10 and paranoia 3/10.  "I'm doing better than I was yesterday.      Principal Problem: Bipolar I disorder, most recent episode mixed ([facility]) Diagnosis:   Patient Active Problem List   Diagnosis Date Noted  . Bipolar I disorder, most recent episode mixed ([facility]) [F31.60] 12/04/2015  . Insomnia [G47.00]   . Anxiety state [F41.1]   . Schizophrenia ([facility]) [F20.9] 12/02/2015   Total Time spent with patient: 15 minutes  Past Psychiatric History: Bipolar disorder, depression.  See information above Past Medical History:  Past Medical History  Diagnosis Date  . Depression    History reviewed. No pertinent past surgical history. Family History: History reviewed. No pertinent family history. Family Psychiatric  History: Denies Social History:  History  Alcohol Use No     History  Drug Use  . Yes  . Special: Cocaine, Marijuana    Comment: denies    Social History   Social History  . Marital Status: Single    Spouse Name: [name]  . Number of Children: [name]  . Years of Education: [name]   Social History Main Topics  . Smoking status: Current Every Day Smoker  . Smokeless tobacco: None  . Alcohol Use: No  . Drug Use: Yes    Special: Cocaine, Marijuana     Comment: denies  . Sexual Activity: Yes    Birth Control/ Protection: None   Other Topics Concern  . None    Social History Narrative   Additional Social History:   Sleep: Fair, States that she would sleep better if home.  Not comfortable here  Appetite:  Fair, Improving  Current Medications: Current Facility-Administered Medications  Medication Dose Route Frequency Provider Last Rate Last Dose  . acetaminophen (TYLENOL) tablet 650 mg  650 mg Oral TID PRN Court Joyharles E Kober, PA-C      . alum & mag hydroxide-simeth (MAALOX/MYLANTA) 200-200-20 MG/5ML suspension 30 mL  30 mL Oral Q4H PRN Beau FannyJohn C Withrow, FNP      . benztropine (COGENTIN) tablet 1 mg  1 mg Oral QHS Beau FannyJohn C Withrow, FNP   1 mg at 12/04/15 2140  . haloperidol (HALDOL) tablet 2 mg  2 mg Oral BID Beau FannyJohn C Withrow, FNP   2 mg at 12/05/15 0744  . hydrOXYzine (ATARAX/VISTARIL) tablet 25 mg  25 mg Oral Q6H PRN Beau FannyJohn C Withrow, FNP   25 mg at 12/05/15 0025  . ibuprofen (ADVIL,MOTRIN) tablet 600 mg  600 mg Oral TID PRN Court Joyharles E Kober, PA-C   600 mg at 12/04/15 2140  . lamoTRIgine (LAMICTAL) tablet 25 mg  25 mg Oral Daily Craige CottaFernando A Lawanna Cecere, MD   25 mg at 12/05/15 0744  . magnesium hydroxide (MILK OF MAGNESIA) suspension 30 mL  30 mL Oral Daily PRN Beau FannyJohn C Withrow, FNP      . traZODone (DESYREL) tablet 50  mg  50 mg Oral QHS PRN Beau Fanny, FNP   50 mg at 12/04/15 2140    Lab Results:  No results found for this or any previous visit (from the past 48 hour(s)).  Blood Alcohol level:  Lab Results  Component Value Date   ETH <5 12/01/2015    Metabolic Disorder Labs: Lab Results  Component Value Date   HGBA1C 4.8 12/03/2015   MPG 91 12/03/2015   Lab Results  Component Value Date   PROLACTIN 66.9* 12/03/2015   Lab Results  Component Value Date   CHOL 121 12/03/2015   TRIG 114 12/03/2015   HDL 48 12/03/2015   CHOLHDL 2.5 12/03/2015   VLDL 23 12/03/2015   LDLCALC 50 12/03/2015    Physical Findings: AIMS: Facial and Oral Movements Muscles of Facial Expression: None, normal Lips and Perioral Area: None, normal Jaw: None,  normal Tongue: None, normal,Extremity Movements Upper (arms, wrists, hands, fingers): None, normal Lower (legs, knees, ankles, toes): None, normal, Trunk Movements Neck, shoulders, hips: None, normal, Overall Severity Severity of abnormal movements (highest score from questions above): None, normal Incapacitation due to abnormal movements: None, normal Patient's awareness of abnormal movements (rate only patient's report): No Awareness, Dental Status Current problems with teeth and/or dentures?: No Does patient usually wear dentures?: No  CIWA:    COWS:     Musculoskeletal: Strength & Muscle Tone: within normal limits Gait & Station: normal Patient leans: [name]  Psychiatric Specialty Exam: Physical Exam  Nursing note and vitals reviewed. Constitutional: She is oriented to person, place, and time.  Neck: Normal range of motion.  Respiratory: Effort normal.  Musculoskeletal: Normal range of motion.  Neurological: She is alert and oriented to person, place, and time.    Review of Systems  Psychiatric/Behavioral: Positive for depression, hallucinations and substance abuse. The patient is nervous/anxious and has insomnia.     Blood pressure 99/53, pulse 89, temperature 97.9 F (36.6 C), temperature source Oral, resp. rate 16, height 6\' 1"  (1.854 m), weight 87.998 kg (194 lb), last menstrual period 11/17/2015.Body mass index is 25.6 kg/(m^2).  General Appearance: Casual  Eye Contact:  Good  Speech:  Normal Rate  Volume:  Normal  Mood:  Depressed  Affect:  Depressed  Thought Process:  Goal Directed  Orientation:  Full (Time, Place, and Person)  Thought Content:  Rumination  Suicidal Thoughts:  Yes.  without intent/plan  Homicidal Thoughts:  No  Memory:  Immediate;   Good Recent;   Good Remote;   Good  Judgement:  Fair  Insight:  Lacking  Psychomotor Activity:  Normal  Concentration:  Concentration: Fair and Attention Span: Fair  Recall:  Good  Fund of Knowledge:  Fair   Language:  Good  Akathisia:  No  Handed:  Right  AIMS (if indicated):     Assets:  Communication Skills Desire for Improvement Social Support  ADL's:  Intact  Cognition:  WNL  Sleep:  Number of Hours: 6     Treatment Plan Summary: Daily contact with patient to assess and evaluate symptoms and progress in treatment and Medication management   Plan: Encourage group milieu Continue Q 15 minute Medication Management:  Continue current medications Major Depression/Mood Stability:  Lamictal 25 mg daily; Haldol 2 mg Q hs Insomnia: Trazodone 50 mg Q hs prn Anxiety:  Vistaril 25 mg Q 6 hr prn Drug induced Extrapyramidal Symptoms:  Cogentin 1 mg Q hs Social worker to work with patient on discharge planning and follow up  Improvement with depression/anxiety/paranoia.  Will continue current treatment plan with no changes at this time.    Rankin, [place]uvon, NP 12/05/2015, 11:53 AM Agree with NP Progress Note as above

## 2015-12-05 NOTE — [facility] Group Notes (Signed)
[facility] LCSW Group Therapy  12/05/2015  11:15 AM to Noon  Type of Therapy:  Group Therapy  Participation Level:  Did Not Attend despite encouragement from both MHT and CSW to do so.   Carney Bernatherine C Harrill, LCSW

## 2015-12-05 NOTE — Progress Notes (Signed)
Nursing Note 12/04/2015 1900 - 12/05/2015 0730  Data Patient being monitored inpatient for depression, auditory hallucinations, paranoia, and SI with undisclosed plan and intent per admission tele-assessment note.  Today denies SI, HI, and AVH.  Affect blunted but appropriate, mood "happy."  Speech mildly disorganized at times, but when asked for clarification patient would correct herself.  C/O lower back pain beginning of shift "I hurt my back playing basketball, it hurts more when I sit down."  Denied falling, patient thought it had to do with how she was moving.  Was applying heat to back.  Had no medication for pain ordered.  Attended group tonight, appropriate and talkative with staff and peers.  Action Spoke with patient 1:1, offered to be a support for duration of shift.  Given ice pack, encouraged patient to rest and ice it, [place] and received order for tylenol and ibuprofen PRN. Given PRN dose of ibuprofen.  Remained on 15 minute checks for safety.  Response Patient awake talking to roommate with lights on 0020, stated ibuprofen was effective.  Offered and given PRN Vistaril 0025.  Patient was resting in bed with eyes closed, even non-labored chest rises shortly after intervention.

## 2015-12-05 NOTE — B[facility] Group Notes (Signed)
[facility]  12/05/2015   Type of Therapy:  Group Therapy  Participation Level:  Did Not Attend   Beverly SessionsLINDSEY, Amy [name] 12/05/2015, 11:56 AM

## 2015-12-05 NOTE — Progress Notes (Signed)
DAR NOTE: Patient presents with anxious affect and depressed mood.  Denies pain, auditory and visual hallucinations, although pt has been talking about people being killed her on earth and pt does not want to be here on [place], but to go [place]. Pt has had some crying spells. Pt was moved to 400 hall, but started talking about going to [place], and being loud. Pt was moved back 500 hall. Rates depression at 2, hopelessness at 1, and anxiety at 1.  Maintained on routine safety checks.  Medications given as prescribed.  Support and encouragement offered as needed.  Attended group and participated.  States goal for today is " not to be depressed.".  Patient observed socializing with peers in the dayroom.  Offered no complaint.
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
## 2015-12-06 DIAGNOSIS — Z9119 Patient's noncompliance with other medical treatment and regimen: Secondary | ICD-10-CM

## 2015-12-06 DIAGNOSIS — F1410 Cocaine abuse, uncomplicated: Secondary | ICD-10-CM

## 2015-12-06 DIAGNOSIS — F250 Schizoaffective disorder, bipolar type: Secondary | ICD-10-CM

## 2015-12-06 MED ORDER — LAMOTRIGINE 25 MG PO TABS: 25.0000 mg | ORAL_TABLET | Freq: Every day | ORAL | Status: DC

## 2015-12-06 MED ORDER — BENZTROPINE MESYLATE 1 MG PO TABS
1.0000 mg | ORAL_TABLET | Freq: Two times a day (BID) | ORAL | Status: DC
  Filled 2015-12-06 (×2): qty 14

## 2015-12-06 MED ORDER — TRAZODONE HCL 50 MG PO TABS: 50.0000 mg | ORAL_TABLET | Freq: Every evening | ORAL | Status: DC | PRN

## 2015-12-06 MED ORDER — HALOPERIDOL 2 MG PO TABS: 2.0000 mg | ORAL_TABLET | Freq: Two times a day (BID) | ORAL | Status: DC

## 2015-12-06 MED ORDER — HYDROXYZINE HCL 25 MG PO TABS: 25.0000 mg | ORAL_TABLET | Freq: Four times a day (QID) | ORAL | Status: DC | PRN
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
## 2015-12-06 NOTE — Progress Notes (Signed)
D: [facility] Denies SI, HI, AVH and pain when assessed. Cooperative with d/c procedure. [employer] voucher given to pt.  A: Scheduled medications administered as ordered. D/C instructions reviewed with pt including prescriptions and medication samples. Pt had no belongings on admission. Emotional support and availability provided to pt. Encouragement provided towards treatment compliance post d/c. Safety checks maintained at Q 15 minutes intervals without self harm gestures to note at this time.  R: Pt receptive to care. Compliant with scheduled medications when offered. Denies adverse drug reactions at time of assessment. Verbalized understanding related to d/c instructions. Ambulatory with steady gait. No physical distress evident at time of departure from facility.

## 2015-12-06 NOTE — BHH Suicide Risk Assessment (Signed)
BHH INPATIENT:  Family/Significant Other Suicide Prevention Education  Suicide Prevention Education:  Education Completed; Tiney RougeLori Perera, Pt's mother [phone], has been identified by the patient as the family member/significant other with whom the patient will be residing, and identified as the person(s) who will aid the patient in the event of a mental health crisis (suicidal ideations/suicide attempt).  With written consent from the patient, the family member/significant other has been provided the following suicide prevention education, prior to the and/or following the discharge of the patient.  The suicide prevention education provided includes the following:  Suicide risk factors  Suicide prevention and interventions  National Suicide Hotline telephone number  [facility] assessment telephone number  [facility] Emergency Assistance 911  [facility] and/or Residential Mobile Crisis Unit telephone number  Request made of family/significant other to:  Remove weapons (e.g., guns, rifles, knives), all items previously/currently identified as safety concern.    Remove drugs/medications (over-the-counter, prescriptions, illicit drugs), all items previously/currently identified as a safety concern.  The family member/significant other verbalizes understanding of the suicide prevention education information provided.  The family member/significant other agrees to remove the items of safety concern listed above.  Pt mother does not feel that Pt is stabilized for discharge at this time and would like for her to stay to be referred to another residential treatment facility. Pt mother told Pt that she would prefer that Pt go back to jail rather than a shelter as she feels Pt would be safer there.  Elaina Hoopsarter, Hy Swiatek M 12/06/2015, 2:57 PM

## 2015-12-06 NOTE — BHH Group Notes (Signed)
[facility] LCSW Aftercare Discharge Planning Group Note  12/06/2015 8:45 AM  Pt did not attend, declined invitation.   Aerabella Galasso Carter, LCSWA 12/06/2015 9:21 AM  

## 2015-12-06 NOTE — BHH Suicide Risk Assessment (Signed)
[facility]  Patient is a 20 year old female diagnosed with schizoaffective disorder, bipolar subtype who was admitted for stabilization and treatment of worsening of depression along with suicidal ideation with a plan to stab herself. Patient also had thoughts of people wanting to harm her. Next  This morning patient reports that she is doing fairly well, reports she's been tolerating the medications, denies any hallucinations, reports that her mood is better and she feels she is okay for discharge. Patient denies any side effects of the medication, any thoughts of hurting herself or others. She also denies any paranoia Principal Problem: Schizoaffective disorder, bipolar type [facility](HCC) Discharge Diagnoses:  Patient Active Problem List   Diagnosis Date Noted  . Schizoaffective disorder, bipolar type (HCC) [F25.0] 03/02/2015    Priority: High  . Insomnia [G47.00]     Priority: Medium  . Anxiety state [F41.1]     Priority: Medium  . Cocaine abuse [F14.10] 07/04/2015    Priority: Medium  . Non compliance with medical treatment [Z91.19] 06/18/2015    Priority: Medium  . Bipolar I disorder, most recent episode mixed (HCC) [F31.60] 12/04/2015  . Schizophrenia (HCC) [F20.9] 12/02/2015  . Cannabis abuse, continuous [F12.10] 07/02/2015    Total Time spent with patient: 30 minutes  Musculoskeletal: Strength & Muscle Tone: within normal limits Gait & Station: normal Patient leans: [name]  Psychiatric Specialty Exam: ROS  Blood pressure 67/54, pulse 98, temperature 98.2 F (36.8 C), temperature source Oral, resp. rate 16, height 6\' 1"  (1.854 m), weight 87.998 kg (194 lb), last menstrual period 11/17/2015.Body mass index is 25.6 kg/(m^2).  General Appearance: Casual  Eye Contact::  Fair  Speech:  Clear and Coherent and Normal Rate  Volume:  Decreased  Mood:  Anxious and Euthymic  Affect:  Congruent and Full Range  Thought Process:  Coherent and Goal Directed   Orientation:  Full (Time, Place, and Person)  Thought Content:  WDL  Suicidal Thoughts:  No  Homicidal Thoughts:  No  Memory:  Immediate;   Fair Recent;   Fair Remote;   Fair  Judgement:  Fair  Insight:  Present  Psychomotor Activity:  Mannerisms  Concentration:  Fair  Recall:  [employer]Fair  Fund of Knowledge:Fair  Language: Fair  Akathisia:  No  Handed:  Right  AIMS (if indicated):     Assets:  Desire for Improvement Social Support  Sleep:  Number of Hours: 6.75  Cognition: WNL  ADL's:  Intact   Mental Status Per Nursing Assessment::   On Admission:  Suicidal ideation indicated by patient, Self-harm thoughts  Demographic Factors:  Adolescent or young adult and Low socioeconomic status  Loss Factors: Legal issues  Historical Factors: Prior suicide attempts and Impulsivity  Risk Reduction Factors:   Living with another person, especially a relative and Positive social support  Continued Clinical Symptoms:  Schizophrenia:   Less than 22 years old  Cognitive Features That Contribute To Risk:  Loss of executive function    Suicide Risk:  Mild:  Suicidal ideation of limited frequency, intensity, duration, and specificity.  There are no identifiable plans, no associated intent, mild dysphoria and related symptoms, good self-control (both objective and subjective assessment), few other risk factors, and identifiable protective factors, including available and accessible social support.    Plan Of Care/Follow-up recommendations:  Activity:  As tolerated Diet:  Regular Other:  Keep follow-up appointments and take medications regularly  Nelly RoutKUMAR,Ajiah Mcglinn, MD 12/06/2015, 11:29 AM

## 2015-12-06 NOTE — Progress Notes (Signed)
  [facility] :  Will you be returning to the same living situation after discharge:  No. [facility] At discharge, do you have transportation home?: Yes,  Pt provided with taxi voucher Do you have the ability to pay for your medications: Yes,  Pt provided with samples and prescriptions  Release of information consent forms completed and in the chart;  Patient's signature needed at discharge.  Patient to Follow up at: Follow-up Information    Follow up with [facility].   Why:  Please use the walk-in clinic between 8am-3pm Monday-Friday for your hospital discharge appointment.   Contact information:   [address] [place] [address] Phone [phone] Fax [phone]      Next level of care provider [facility] Link:no  Safety Planning and Suicide Prevention discussed: Yes,  with mother; see SPE note  Have you used any form of tobacco in the last 30 days? (Cigarettes, Smokeless Tobacco, Cigars, and/or Pipes): Yes  Has patient been referred to the Quitline?: Patient refused referral  Patient has been referred for addiction treatment: Yes  Amy [name], Amy [name] 12/06/2015, 3:00 PM

## 2015-12-06 NOTE — Tx Team (Signed)
Interdisciplinary Treatment Plan Update (Adult) Date: 12/06/2015   Date: 12/06/2015 2:55 PM  Progress in Treatment:  Attending groups: Minimally Participating in groups: Minimally Taking medication as prescribed: Yes  Tolerating medication: Yes  Family/Significant othe contact made: Yes with mother Patient understands diagnosis: Limited insight Discussing patient identified problems/goals with staff: Yes  Medical problems stabilized or resolved: Yes  Denies suicidal/homicidal ideation: Yes Patient has not harmed self or Others: Yes   New problem(s) identified: None identified at this time.   Discharge Plan or Barriers: Pt to discharge to [facility] and follow-up with [facility]  Additional comments:  Patient and CSW reviewed pt's identified goals and treatment plan. Patient verbalized understanding and agreed to treatment plan.   Reason for Continuation of Hospitalization:  Anxiety Depression Medication stabilization Suicidal ideation Psychosis- Paranoia  Estimated length of stay: 0 days  Review of initial/current patient goals per problem list:   1.  Goal(s): Patient will participate in aftercare plan  Met:  Yes  Target date: 3-5 days from date of admission   As evidenced by: Patient will participate within aftercare plan AEB aftercare provider and housing plan at discharge being identified.  12/03/15: CSW to work with Pt to assess for appropriate discharge plan and faciliate appointments and referrals as needed prior to d/c. 12/06/2015: Pt to discharge to [facility] and follow-up with [facility]  2.  Goal (s): Patient will exhibit decreased depressive symptoms and suicidal ideations.  Met:  Adequate for DC'  Target date: 3-5 days from date of admission   As evidenced by: Patient will utilize self rating of depression at 3 or below and demonstrate decreased signs of depression or be deemed stable for discharge by MD. 12/03/15: Pt was admitted with symptoms of depression, rating 10/10. Pt continues  to present with flat affect and depressive symptoms.  Pt will demonstrate decreased symptoms of depression and rate depression at 3/10 or lower prior to discharge. MD feels that Pt's symptoms have decreased to the point that they can be managed in an outpatient setting.   3.  Goal(s): Patient will demonstrate decreased signs and symptoms of anxiety.  Met:  Adequate for DC  Target date: 3-5 days from date of admission   As evidenced by: Patient will utilize self rating of anxiety at 3 or below and demonstrated decreased signs of anxiety, or be deemed stable for discharge by MD 12/03/15: Pt was admitted with increased levels of anxiety and is currently rating those symptoms highly. Pt will demonstrated decreased symptoms of anxiety and rate it at 3/10 prior to d/c. 12/06/2015: MD feels that Pt's symptoms have decreased to the point that they can be managed in an outpatient setting.   5.  Goal(s): Patient will demonstrate decreased signs of psychosis  . Met:  Adequate for DC . Target date: 3-5 days from date of admission  . As evidenced by: Patient will demonstrate decreased frequency of AVH or return to baseline function - 12/03/15: Pt admitted with paranoid thoughts, feeling that people are trying to kill her.    12/06/2015: MD feels that Pt's symptoms have decreased to the point that they can be managed in an outpatient setting.   Attendees:  Patient:    Family:    Physician: Dr. Dwyane Dee, MD  12/06/2015 2:55 PM  Nursing: Phillis Haggis., Desma Paganini, RN  12/06/2015 2:55 PM  Clinical Social Worker Peri Maris, [place] 12/06/2015 2:55 PM  Other: Tilden Fossa, [place] 12/06/2015 2:55 PM  Clinical: Lars Pinks, RN Case manager  12/06/2015 2:55  PM  Other:  12/06/2015 2:55 PM  Other:     Peri Maris, [place] Work [phone]

## 2016-03-28 ENCOUNTER — Encounter (HOSPITAL_BASED_OUTPATIENT_CLINIC_OR_DEPARTMENT_OTHER): Payer: Self-pay | Admitting: *Deleted

## 2016-03-28 ENCOUNTER — Emergency Department (HOSPITAL_BASED_OUTPATIENT_CLINIC_OR_DEPARTMENT_OTHER)
Admission: EM | Admit: 2016-03-28 | Discharge: 2016-03-28 | Disposition: A | Discharge status: Home / Self Care | Attending: Emergency Medicine | Admitting: Emergency Medicine

## 2016-03-28 DIAGNOSIS — B373 Candidiasis of vulva and vagina: Secondary | ICD-10-CM | POA: Insufficient documentation

## 2016-03-28 DIAGNOSIS — L02214 Cutaneous abscess of groin: Secondary | ICD-10-CM | POA: Insufficient documentation

## 2016-03-28 DIAGNOSIS — B3731 Acute candidiasis of vulva and vagina: Secondary | ICD-10-CM

## 2016-03-28 DIAGNOSIS — F17200 Nicotine dependence, unspecified, uncomplicated: Secondary | ICD-10-CM | POA: Insufficient documentation

## 2016-03-28 LAB — WET PREP, GENITAL
CLUE CELLS WET PREP: NONE SEEN
Sperm: NONE SEEN
Trich, Wet Prep: NONE SEEN

## 2016-03-28 LAB — URINALYSIS, ROUTINE W REFLEX MICROSCOPIC
BILIRUBIN URINE: NEGATIVE
GLUCOSE, UA: NEGATIVE mg/dL
HGB URINE DIPSTICK: NEGATIVE
KETONES UR: NEGATIVE mg/dL
NITRITE: NEGATIVE
PH: 6 (ref 5.0–8.0)
Protein, ur: NEGATIVE mg/dL
SPECIFIC GRAVITY, URINE: 1.022 (ref 1.005–1.030)

## 2016-03-28 LAB — PREGNANCY, URINE: Preg Test, Ur: NEGATIVE

## 2016-03-28 LAB — URINE MICROSCOPIC-ADD ON: RBC / HPF: NONE SEEN RBC/hpf (ref 0–5)

## 2016-03-28 MED ORDER — LIDOCAINE-EPINEPHRINE (PF) 2 %-1:200000 IJ SOLN
10.0000 mL | Freq: Once | INTRAMUSCULAR | Status: AC
  Administered 2016-03-28: 10 mL via INTRADERMAL
  Filled 2016-03-28: qty 10

## 2016-03-28 MED ORDER — FLUCONAZOLE 150 MG PO TABS: 150.0000 mg | ORAL_TABLET | Freq: Every day | ORAL | 0 refills | Status: AC

## 2016-03-28 MED ORDER — FLUCONAZOLE 50 MG PO TABS
150.0000 mg | ORAL_TABLET | Freq: Once | ORAL | Status: AC
  Administered 2016-03-28: 150 mg via ORAL
  Filled 2016-03-28: qty 1

## 2016-03-28 MED ORDER — MICONAZOLE NITRATE 2 % EX CREA: 1.0000 "application " | TOPICAL_CREAM | Freq: Two times a day (BID) | CUTANEOUS | 0 refills | Status: DC

## 2016-03-28 NOTE — ED Triage Notes (Signed)
Vaginal itching and redness.

## 2016-03-28 NOTE — Discharge Instructions (Signed)
Keep your abscess clean and dry. It should heal in several days. He can do warm compresses, bacitracin twice a day. Apply cream to the vaginal area twice a day. Take Diflucan 1 tablet daily. Follow-up as needed.

## 2016-03-28 NOTE — ED Provider Notes (Signed)
[facility] Note   CSN: [id number] Arrival date & time: 03/28/16  1320     History   Chief Complaint Chief Complaint  Patient presents with  . Vaginal Itching    HPI Amy [name] is a 20 y.o. female.  HPI Amy [name] is a 20 y.o. female with history of schizophrenia, polysubstance abuse, presents to emergency department complaining of an abscess to the right groin and vaginal rash and itching. Patient states the abscess in the right groin comes and goes, states usually gets antibiotics from her doctor or another hospital and states that he usually improves but it always comes back. States it came back several days ago. Is there a tender. No drainage. No surrounding redness. Patient is also complaining of vaginal itching and rash. Denies any vaginal discharge. Denies concern for STD. No abdominal pain. No vaginal bleeding. Denies possibility of pregnancy. States she has been putting hydrocortisone cream to the rash area which has helped some.  Past Medical History:  Diagnosis Date  . Depression     Patient Active Problem List   Diagnosis Date Noted  . Bipolar I disorder, most recent episode mixed (HCC) 12/04/2015  . Insomnia   . Anxiety state   . Schizophrenia (HCC) 12/02/2015  . Cocaine abuse 07/04/2015  . Cannabis abuse, continuous 07/02/2015  . Non compliance with medical treatment 06/18/2015  . Schizoaffective disorder, bipolar type (HCC) 03/02/2015    History reviewed. No pertinent surgical history.  OB History    No data available       Home Medications    Prior to Admission medications   Medication Sig Start Date End Date Taking? Authorizing Provider  benztropine (COGENTIN) 1 MG tablet Take 1 mg by mouth 2 (two) times daily. 11/12/15 11/11/16  Historical Provider, MD  haloperidol (HALDOL) 2 MG tablet Take 1 tablet (2 mg total) by mouth 2 (two) times daily. 12/06/15   Oneta Rack, NP  hydrOXYzine (ATARAX/VISTARIL) 25 MG tablet  Take 1 tablet (25 mg total) by mouth every 6 (six) hours as needed for anxiety. 12/06/15   Oneta Rack, NP  lamoTRIgine (LAMICTAL) 25 MG tablet Take 1 tablet (25 mg total) by mouth daily. 12/06/15   Oneta Rack, NP  traZODone (DESYREL) 50 MG tablet Take 1 tablet (50 mg total) by mouth at bedtime as needed for sleep. 12/06/15   Oneta Rack, NP    Family History No family history on file.  Social History Social History  Substance Use Topics  . Smoking status: Current Every Day Smoker  . Smokeless tobacco: Never Used  . Alcohol use No     Allergies   Depakote [divalproex sodium]; Valproic acid; and Penicillins   Review of Systems Review of Systems  Constitutional: Negative for chills and fever.  Respiratory: Negative for cough, chest tightness and shortness of breath.   Cardiovascular: Negative for chest pain, palpitations and leg swelling.  Gastrointestinal: Negative for abdominal pain, diarrhea, nausea and vomiting.  Genitourinary: Negative for dysuria, flank pain, pelvic pain, vaginal bleeding, vaginal discharge and vaginal pain.  Musculoskeletal: Negative for arthralgias, myalgias, neck pain and neck stiffness.  Skin: Positive for rash and wound.  Neurological: Negative for dizziness, weakness and headaches.  All other systems reviewed and are negative.    Physical Exam Updated Vital Signs BP 128/59   Pulse 67   Temp 98.1 F (36.7 C) (Oral)   Resp 18   Ht 6\' 1"  (1.854 m)   Wt 76.2  kg   LMP 03/19/2016   SpO2 99%   BMI 22.16 kg/m   Physical Exam  Constitutional: She appears well-developed and well-nourished. No distress.  HENT:  Head: Normocephalic.  Eyes: Conjunctivae are normal.  Neck: Neck supple.  Cardiovascular: Normal rate, regular rhythm and normal heart sounds.   Pulmonary/Chest: Effort normal and breath sounds normal. No respiratory distress. She has no wheezes. She has no rales.  Abdominal: Soft. Bowel sounds are normal. She exhibits no  distension. There is no tenderness. There is no rebound.  Genitourinary:  Genitourinary Comments: Hyperpigmentation, erythema, scaly rash to bilateral labia majora. White cottage cheeselike vaginal discharge. Cervix is normal. No cervical motion tenderness no adnexal tenderness, no uterine tenderness.  Musculoskeletal: She exhibits no edema.  Neurological: She is alert.  Skin: Skin is warm and dry.  2 x 2 centimeter abscess to the right groin, and tender to palpation, no surrounding erythema, no drainage.  Psychiatric: She has a normal mood and affect. Her behavior is normal.  Nursing note and vitals reviewed.    ED Treatments / Results  Labs (all labs ordered are listed, but only abnormal results are displayed) Labs Reviewed  URINALYSIS, ROUTINE W REFLEX MICROSCOPIC (NOT AT [facility]) - Abnormal; Notable for the following:       Result Value   Leukocytes, UA TRACE (*)    All other components within normal limits  URINE MICROSCOPIC-ADD ON - Abnormal; Notable for the following:    Squamous Epithelial / LPF 6-30 (*)    Bacteria, UA MANY (*)    All other components within normal limits  WET PREP, GENITAL  PREGNANCY, URINE  GC/CHLAMYDIA PROBE AMP ([employer]) NOT AT [facility]    EKG  EKG Interpretation None       Radiology No results found.  Procedures Procedures (including critical care time)  INCISION AND DRAINAGE Performed by: Jaynie CrumbleKIRICHENKO, Yandiel Bergum A Consent: Verbal consent obtained. Risks and benefits: risks, benefits and alternatives were discussed Type: abscess  Body area: right groin  Anesthesia: local infiltration  Incision was made with a scalpel.  Local anesthetic: lidocaine 2% w epinephrine  Anesthetic total: 3 ml  Complexity: complex Blunt dissection to break up loculations  Drainage: purulent  Drainage amount: large  Packing material: no packing  Patient tolerance: Patient tolerated the procedure well with no immediate complications.    Medications  Ordered in ED Medications  lidocaine-EPINEPHrine (XYLOCAINE W/EPI) 2 %-1:200000 (PF) injection 10 mL (10 mLs Intradermal Given 03/28/16 1405)     Initial Impression / Assessment and Plan / ED Course  I have reviewed the triage vital signs and the nursing notes.  Pertinent labs & imaging results that were available during my care of the patient were reviewed by me and considered in my medical decision making (see chart for details).  Clinical Course      Final Clinical Impressions(s) / ED Diagnoses   Final diagnoses:  None   Patient emergency department with 2 separate complaints. One is an abscess to the right groin which I incised and drained. I do not think she needs any antibiotics. Abscess is quite small with no surrounding cellulitis. Patient's other complaint is vaginal itching and discharge. Exam was consistent with Candida infection. Will treat with topical Monistat and Diflucan. Home with those medications and follow-up as needed.  Vitals:   03/28/16 1326 03/28/16 1327 03/28/16 1508  BP:  128/59 (!) 108/52  Pulse:  67 60  Resp:  18 14  Temp:  98.1 F (36.7 C)  TempSrc:  Oral   SpO2:  99% 100%  Weight: 76.2 kg    Height: 6\' 1"  (1.854 m)      New Prescriptions Discharge Medication List as of 03/28/2016  3:11 PM    START taking these medications   Details  fluconazole (DIFLUCAN) 150 MG tablet Take 1 tablet (150 mg total) by mouth daily., Starting Tue 03/28/2016, Until Thu 03/30/2016, Print    miconazole (MICOTIN) 2 % cream Apply 1 application topically 2 (two) times daily., Starting Tue 03/28/2016, Print         Jaynie Crumbleatyana Malajah Oceguera, PA-C 03/28/16 1544    Lyndal Pulleyaniel Knott, MD 03/28/16 [phone]

## 2016-03-29 LAB — GC/CHLAMYDIA PROBE AMP (~~LOC~~) NOT AT ARMC
CHLAMYDIA, DNA PROBE: NEGATIVE
NEISSERIA GONORRHEA: NEGATIVE

## 2016-05-11 ENCOUNTER — Encounter (HOSPITAL_BASED_OUTPATIENT_CLINIC_OR_DEPARTMENT_OTHER): Payer: Self-pay

## 2016-05-11 ENCOUNTER — Emergency Department (HOSPITAL_BASED_OUTPATIENT_CLINIC_OR_DEPARTMENT_OTHER)
Admission: EM | Admit: 2016-05-11 | Discharge: 2016-05-11 | Disposition: A | Discharge status: Home / Self Care | Attending: Physician Assistant | Admitting: Physician Assistant

## 2016-05-11 DIAGNOSIS — F17210 Nicotine dependence, cigarettes, uncomplicated: Secondary | ICD-10-CM | POA: Insufficient documentation

## 2016-05-11 DIAGNOSIS — L02214 Cutaneous abscess of groin: Secondary | ICD-10-CM | POA: Insufficient documentation

## 2016-05-11 DIAGNOSIS — L0291 Cutaneous abscess, unspecified: Secondary | ICD-10-CM

## 2016-05-11 NOTE — ED Provider Notes (Signed)
[facility] Provider Note   CSN: [id number] Arrival date & time: 05/11/16  1625  By signing my name below, I, Bridgette HabermannMaria Tan, attest that this documentation has been prepared under the direction and in the presence of Newell RubbermaidJeffrey Kymberley Raz, PA-C. Electronically Signed: Bridgette HabermannMaria Tan, ED Scribe. 05/11/16. 4:55 PM.  History   Chief Complaint Chief Complaint  Patient presents with  . Acne    pimple area to groin   HPI Comments: Otho Najjardantre C Sacra is a 20 y.o. female with no pertinent PMHx, who presents to the Emergency Department complaining of a moderate, gradually worsening area of pain and swelling to the left groin onset earlier today. Pt states pain is exacerbated with palpation and direct pressure. No alleviating factors noted. Denies h/o similar symptoms. Pt further denies fever, chills, drainage from the area.   The history is provided by the patient. No language interpreter was used.    Past Medical History:  Diagnosis Date  . Depression     Patient Active Problem List   Diagnosis Date Noted  . Bipolar I disorder, most recent episode mixed (HCC) 12/04/2015  . Insomnia   . Anxiety state   . Schizophrenia (HCC) 12/02/2015  . Cocaine abuse 07/04/2015  . Cannabis abuse, continuous 07/02/2015  . Non compliance with medical treatment 06/18/2015  . Schizoaffective disorder, bipolar type (HCC) 03/02/2015    History reviewed. No pertinent surgical history.  OB History    No data available       Home Medications    Prior to Admission medications   Not on File    Family History No family history on file.  Social History Social History  Substance Use Topics  . Smoking status: Current Every Day Smoker    Types: Cigarettes  . Smokeless tobacco: Never Used  . Alcohol use No     Allergies   Depakote [divalproex sodium]; Valproic acid; and Penicillins   Review of Systems Review of Systems 10 Systems reviewed and all are negative for acute change except as noted  in the HPI. Physical Exam Updated Vital Signs BP 107/59 (BP Location: Right Arm)   Pulse 63   Temp 98.1 F (36.7 C) (Oral)   Resp 16   Ht 6\' 1"  (1.854 m)   Wt 91.6 kg   LMP 05/10/2016   SpO2 100%   BMI 26.65 kg/m   Physical Exam  Constitutional: She appears well-developed and well-nourished.  HENT:  Head: Normocephalic.  Eyes: Conjunctivae are normal.  Cardiovascular: Normal rate.   Pulmonary/Chest: Effort normal. No respiratory distress.  Abdominal: She exhibits no distension.  Musculoskeletal: Normal range of motion.  Neurological: She is alert.  Skin: Skin is warm and dry.  Pimple to the left groin. No signs of surround cellulitis, no purulence or drainage.  Psychiatric: She has a normal mood and affect. Her behavior is normal.  Nursing note and vitals reviewed.    ED Treatments / Results  DIAGNOSTIC STUDIES: Oxygen Saturation is 100% on RA, normal by my interpretation.    COORDINATION OF CARE: 4:54 PM Discussed treatment plan with pt at bedside which includes at home remedies and pt agreed to plan.  Labs (all labs ordered are listed, but only abnormal results are displayed) Labs Reviewed - No data to display  EKG  EKG Interpretation None       Radiology No results found.  Procedures Procedures (including critical care time)  Medications Ordered in ED Medications - No data to display   Initial Impression / Assessment  and Plan / ED Course  I have reviewed the triage vital signs and the nursing notes.  Pertinent labs & imaging results that were available during my care of the patient were reviewed by me and considered in my medical decision making (see chart for details).  Clinical Course      Final Clinical Impressions(s) / ED Diagnoses   Final diagnoses:  Abscess   Labs:  Imaging:  Consults:  Therapeutics:  Discharge Meds:   Assessment/Plan:  20 year old female presents today with very small pimple to her left inner thigh. No  signs of significant abscess or infection, patient will be instructed to use warm compresses, follow up if symptoms worsen. She verbalizes understanding and agreement to today's plan  New Prescriptions There are no discharge medications for this patient.  I personally performed the services described in this documentation, which was scribed in my presence. The recorded information has been reviewed and is accurate.     Eyvonne MechanicJeffrey Lizbeth Feijoo, PA-C 05/11/16 1739    Eyvonne MechanicJeffrey Novali Vollman, PA-C 05/11/16 1740    Courteney Randall AnLyn Mackuen, MD 05/11/16 2222

## 2016-05-11 NOTE — ED Triage Notes (Signed)
Pt c/o pimple like area to left groin x today-NAD-steady gait-states she did not tell reg her correct c/o for privacy reasons

## 2016-05-11 NOTE — ED Notes (Signed)
States has a ?pimple to her left groin area today. Nodule noted about pea-sized, no drainage noted, to hairline of left groin.

## 2016-05-11 NOTE — Discharge Instructions (Signed)
Please read attached information. If you experience any new or worsening signs or symptoms please return to the emergency room for evaluation. Please use warm compress on the area several times daily. Please follow-up with your primary care provider or specialist as discussed.

## 2016-10-23 ENCOUNTER — Emergency Department (HOSPITAL_COMMUNITY): Admission: EM | Admit: 2016-10-23 | Discharge: 2016-10-24 | Disposition: A | Discharge status: Home / Self Care

## 2016-10-23 ENCOUNTER — Encounter (HOSPITAL_COMMUNITY): Payer: Self-pay | Admitting: *Deleted

## 2016-10-23 DIAGNOSIS — F258 Other schizoaffective disorders: Secondary | ICD-10-CM | POA: Insufficient documentation

## 2016-10-23 DIAGNOSIS — Z5181 Encounter for therapeutic drug level monitoring: Secondary | ICD-10-CM | POA: Insufficient documentation

## 2016-10-23 DIAGNOSIS — F1410 Cocaine abuse, uncomplicated: Secondary | ICD-10-CM | POA: Diagnosis present

## 2016-10-23 DIAGNOSIS — F1210 Cannabis abuse, uncomplicated: Secondary | ICD-10-CM | POA: Diagnosis present

## 2016-10-23 DIAGNOSIS — F312 Bipolar disorder, current episode manic severe with psychotic features: Secondary | ICD-10-CM | POA: Diagnosis present

## 2016-10-23 DIAGNOSIS — F319 Bipolar disorder, unspecified: Secondary | ICD-10-CM

## 2016-10-23 DIAGNOSIS — F17210 Nicotine dependence, cigarettes, uncomplicated: Secondary | ICD-10-CM | POA: Insufficient documentation

## 2016-10-23 LAB — CBC WITH DIFFERENTIAL/PLATELET
Basophils Absolute: 0 10*3/uL (ref 0.0–0.1)
Basophils Relative: 0 %
Eosinophils Absolute: 0.1 10*3/uL (ref 0.0–0.7)
Eosinophils Relative: 1 %
HCT: 40.8 % (ref 36.0–46.0)
Hemoglobin: 14.3 g/dL (ref 12.0–15.0)
Lymphocytes Relative: 25 %
Lymphs Abs: 2.6 10*3/uL (ref 0.7–4.0)
MCH: 34.1 pg — ABNORMAL HIGH (ref 26.0–34.0)
MCHC: 35 g/dL (ref 30.0–36.0)
MCV: 97.4 fL (ref 78.0–100.0)
Monocytes Absolute: 0.9 10*3/uL (ref 0.1–1.0)
Monocytes Relative: 8 %
Neutro Abs: 7.1 10*3/uL (ref 1.7–7.7)
Neutrophils Relative %: 66 %
Platelets: 239 10*3/uL (ref 150–400)
RBC: 4.19 MIL/uL (ref 3.87–5.11)
RDW: 12.6 % (ref 11.5–15.5)
WBC: 10.7 10*3/uL — ABNORMAL HIGH (ref 4.0–10.5)

## 2016-10-23 LAB — RAPID URINE DRUG SCREEN, HOSP PERFORMED
Amphetamines: NOT DETECTED
Barbiturates: NOT DETECTED
Benzodiazepines: NOT DETECTED
Cocaine: POSITIVE — AB
Opiates: NOT DETECTED
Tetrahydrocannabinol: POSITIVE — AB

## 2016-10-23 LAB — ETHANOL: Alcohol, Ethyl (B): <5 mg/dL (ref <5)

## 2016-10-23 LAB — COMPREHENSIVE METABOLIC PANEL
ALBUMIN: 4.6 g/dL (ref 3.5–5.0)
ALK PHOS: 51 U/L (ref 38–126)
ALT: 13 U/L — AB (ref 14–54)
AST: 17 U/L (ref 15–41)
Anion gap: 7 (ref 5–15)
BILIRUBIN TOTAL: 1.4 mg/dL — AB (ref 0.3–1.2)
BUN: 10 mg/dL (ref 6–20)
CO2: 24 mmol/L (ref 22–32)
CREATININE: 1.01 mg/dL — AB (ref 0.44–1.00)
Calcium: 9.1 mg/dL (ref 8.9–10.3)
Chloride: 109 mmol/L (ref 101–111)
GFR calc Af Amer: >60 mL/min (ref >60)
GLUCOSE: 83 mg/dL (ref 65–99)
Potassium: 3.6 mmol/L (ref 3.5–5.1)
Sodium: 140 mmol/L (ref 135–145)
TOTAL PROTEIN: 7.6 g/dL (ref 6.5–8.1)

## 2016-10-23 LAB — PREGNANCY, URINE: Preg Test, Ur: NEGATIVE
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
## 2016-10-23 NOTE — ED Notes (Signed)
Bed: WA29 Expected date:  Expected time:  Means of arrival:  Comments: BH ETOH/SI

## 2016-10-23 NOTE — ED Provider Notes (Signed)
[facility]-EMERGENCY DEPT Provider Note   CSN: [id number] Arrival date & time: 10/23/16  1819     History   Chief Complaint No chief complaint on file.   HPI Amy [name] is a 21 y.o. female.  21 year old female presents after becoming severely agitated after having an argument with another individual. She does have a history of bipolar disorder as well as schizophrenia and has been noncompliant with her medications. She also admits to daily cannabis use as well as cocaine use. States that today she became very upset and called police and in the presence of the officer whom I spoke with says that she became very erratic and started crying and screaming and would not follow commands. The police decided to take out IVC paperwork. Patient denies responding to internal stimuli. States that she is very upset at this time.      Past Medical History:  Diagnosis Date  . Depression     Patient Active Problem List   Diagnosis Date Noted  . Bipolar I disorder, most recent episode mixed (HCC) 12/04/2015  . Insomnia   . Anxiety state   . Schizophrenia (HCC) 12/02/2015  . Cocaine abuse 07/04/2015  . Cannabis abuse, continuous 07/02/2015  . Non compliance with medical treatment 06/18/2015  . Schizoaffective disorder, bipolar type (HCC) 03/02/2015    No past surgical history on file.  OB History    No data available       Home Medications    Prior to Admission medications   Not on File    Family History No family history on file.  Social History Social History  Substance Use Topics  . Smoking status: Current Every Day Smoker    Types: Cigarettes  . Smokeless tobacco: Never Used  . Alcohol use No     Allergies   Depakote [divalproex sodium]; Valproic acid; and Penicillins   Review of Systems Review of Systems  All other systems reviewed and are negative.    Physical Exam Updated Vital Signs There were no vitals taken for this visit.  Physical Exam    Constitutional: She is oriented to person, place, and time. She appears well-developed and well-nourished.  Non-toxic appearance. No distress.  HENT:  Head: Normocephalic and atraumatic.  Eyes: Conjunctivae, EOM and lids are normal. Pupils are equal, round, and reactive to light.  Neck: Normal range of motion. Neck supple. No tracheal deviation present. No thyroid mass present.  Cardiovascular: Normal rate, regular rhythm and normal heart sounds.  Exam reveals no gallop.   No murmur heard. Pulmonary/Chest: Effort normal and breath sounds normal. No stridor. No respiratory distress. She has no decreased breath sounds. She has no wheezes. She has no rhonchi. She has no rales.  Abdominal: Soft. Normal appearance and bowel sounds are normal. She exhibits no distension. There is no tenderness. There is no rebound and no CVA tenderness.  Musculoskeletal: Normal range of motion. She exhibits no edema or tenderness.  Neurological: She is alert and oriented to person, place, and time. She has normal strength. No cranial nerve deficit or sensory deficit. GCS eye subscore is 4. GCS verbal subscore is 5. GCS motor subscore is 6.  Skin: Skin is warm and dry. No abrasion and no rash noted.  Psychiatric: Her affect is labile. Her speech is rapid and/or pressured. She is agitated. She expresses no suicidal plans and no homicidal plans.  Nursing note and vitals reviewed.    ED Treatments / Results  Labs (all labs ordered are listed, but  only abnormal results are displayed) Labs Reviewed  CBC WITH DIFFERENTIAL/PLATELET  COMPREHENSIVE METABOLIC PANEL  ETHANOL  RAPID URINE DRUG SCREEN, HOSP PERFORMED  I-STAT BETA HCG BLOOD, ED ([facility], [facility], [facility] ONLY)    EKG  EKG Interpretation None       Radiology No results found.  Procedures Procedures (including critical care time)  Medications Ordered in ED Medications - No data to display   Initial Impression / Assessment and Plan / ED Course  I have  reviewed the triage vital signs and the nursing notes.  Pertinent labs & imaging results that were available during my care of the patient were reviewed by me and considered in my medical decision making (see chart for details).     Patient medically clear for psychiatric disposition  Final Clinical Impressions(s) / ED Diagnoses   Final diagnoses:  None    New Prescriptions New Prescriptions   No medications on file     Lorre Nick, MD 10/27/16 [phone]

## 2016-10-24 ENCOUNTER — Encounter (HOSPITAL_COMMUNITY): Payer: Self-pay

## 2016-10-24 ENCOUNTER — Inpatient Hospital Stay (HOSPITAL_COMMUNITY)
Admission: AD | Admit: 2016-10-24 | Discharge: 2016-11-01 | DRG: 885 | Disposition: A | Discharge status: Home / Self Care | Payer: No Typology Code available for payment source | Attending: Psychiatry | Admitting: Psychiatry

## 2016-10-24 DIAGNOSIS — Z915 Personal history of self-harm: Secondary | ICD-10-CM

## 2016-10-24 DIAGNOSIS — Z9114 Patient's other noncompliance with medication regimen: Secondary | ICD-10-CM | POA: Diagnosis not present

## 2016-10-24 DIAGNOSIS — F1410 Cocaine abuse, uncomplicated: Secondary | ICD-10-CM | POA: Diagnosis present

## 2016-10-24 DIAGNOSIS — F1220 Cannabis dependence, uncomplicated: Secondary | ICD-10-CM | POA: Diagnosis present

## 2016-10-24 DIAGNOSIS — F3164 Bipolar disorder, current episode mixed, severe, with psychotic features: Secondary | ICD-10-CM | POA: Diagnosis present

## 2016-10-24 DIAGNOSIS — F319 Bipolar disorder, unspecified: Secondary | ICD-10-CM | POA: Diagnosis present

## 2016-10-24 DIAGNOSIS — F250 Schizoaffective disorder, bipolar type: Secondary | ICD-10-CM

## 2016-10-24 DIAGNOSIS — F3160 Bipolar disorder, current episode mixed, unspecified: Secondary | ICD-10-CM | POA: Diagnosis present

## 2016-10-24 DIAGNOSIS — F17210 Nicotine dependence, cigarettes, uncomplicated: Secondary | ICD-10-CM | POA: Diagnosis present

## 2016-10-24 DIAGNOSIS — F259 Schizoaffective disorder, unspecified: Secondary | ICD-10-CM | POA: Diagnosis present

## 2016-10-24 DIAGNOSIS — G4700 Insomnia, unspecified: Secondary | ICD-10-CM | POA: Diagnosis not present

## 2016-10-24 MED ORDER — ARIPIPRAZOLE 10 MG PO TABS
10.0000 mg | ORAL_TABLET | Freq: Every day | ORAL | Status: DC
  Administered 2016-10-24: 10 mg via ORAL
  Filled 2016-10-24: qty 1

## 2016-10-24 MED ORDER — TRAZODONE HCL 100 MG PO TABS: 100.0000 mg | ORAL_TABLET | Freq: Every day | ORAL | Status: DC

## 2016-10-24 MED ORDER — LAMOTRIGINE 25 MG PO TABS
25.0000 mg | ORAL_TABLET | Freq: Every day | ORAL | Status: DC
  Administered 2016-10-24: 25 mg via ORAL
  Filled 2016-10-24: qty 1

## 2016-10-24 MED ORDER — ACETAMINOPHEN 325 MG PO TABS
650.0000 mg | ORAL_TABLET | Freq: Four times a day (QID) | ORAL | Status: DC | PRN
  Administered 2016-10-24 – 2016-10-30 (×4): 650 mg via ORAL
  Filled 2016-10-24 (×4): qty 2

## 2016-10-24 MED ORDER — OLANZAPINE 10 MG PO TBDP
10.0000 mg | ORAL_TABLET | Freq: Every day | ORAL | Status: DC
  Administered 2016-10-24 – 2016-10-25 (×2): 10 mg via ORAL
  Filled 2016-10-24 (×5): qty 1

## 2016-10-24 MED ORDER — ZIPRASIDONE MESYLATE 20 MG IM SOLR: 10.0000 mg | Freq: Once | INTRAMUSCULAR | Status: DC

## 2016-10-24 MED ORDER — OLANZAPINE 10 MG PO TBDP
10.0000 mg | ORAL_TABLET | Freq: Once | ORAL | Status: AC
  Administered 2016-10-24: 10 mg via ORAL
  Filled 2016-10-24: qty 1

## 2016-10-24 MED ORDER — TRAZODONE HCL 100 MG PO TABS
100.0000 mg | ORAL_TABLET | Freq: Every evening | ORAL | Status: DC | PRN
  Administered 2016-10-24 – 2016-10-29 (×7): 100 mg via ORAL
  Filled 2016-10-24 (×18): qty 1

## 2016-10-24 MED ORDER — LORAZEPAM 2 MG/ML IJ SOLN: 2.0000 mg | Freq: Once | INTRAMUSCULAR | Status: DC

## 2016-10-24 MED ORDER — DIPHENHYDRAMINE HCL 25 MG PO CAPS
50.0000 mg | ORAL_CAPSULE | Freq: Four times a day (QID) | ORAL | Status: DC | PRN
  Administered 2016-10-26 – 2016-10-30 (×4): 50 mg via ORAL
  Filled 2016-10-24 (×4): qty 2

## 2016-10-24 MED ORDER — DIPHENHYDRAMINE HCL 50 MG/ML IJ SOLN: 50.0000 mg | Freq: Once | INTRAMUSCULAR | Status: DC

## 2016-10-24 MED ORDER — ALUM & MAG HYDROXIDE-SIMETH 200-200-20 MG/5ML PO SUSP: 30.0000 mL | ORAL | Status: DC | PRN

## 2016-10-24 MED ORDER — MAGNESIUM HYDROXIDE 400 MG/5ML PO SUSP: 30.0000 mL | Freq: Every day | ORAL | Status: DC | PRN

## 2016-10-24 NOTE — ED Notes (Signed)
Patient continuing to cry and threw herself out of the bed.  NP called and orders obtained for medication.

## 2016-10-24 NOTE — Progress Notes (Addendum)
Report received from admitting RN.  Met with pt and offered support and encouragement.  Pt denies SI/HI, denies hallucinations, reports right knee pain of 8/10.  Shortly after, pt was on the phone and was anxious, tearful, irritable while speaking with her mother.  Attempted to verbally de-escalate pt.  On-site provider met with pt and placed admission orders.  EKG performed and shown to on-site provider.  Medication administered per order.  PRN medication administered for pain.  Pt sleeps with all the lights on in her room because she is "scared."  Pt verbally contracts for safety.  She reports she will inform staff of needs and concerns.  Will continue to monitor and assess.

## 2016-10-24 NOTE — ED Notes (Signed)
Spoke with TTS, inpatient recommended

## 2016-10-24 NOTE — Progress Notes (Signed)
10/24/16 1355:  LRT introduced self to pt and offered activities, pt declined.  Caroll RancherMarjette Azalie Harbeck, LRT/CTRS

## 2016-10-24 NOTE — ED Notes (Signed)
Patient became very upset talking to her mother.  Talking in a loud voice and now sobbing in her bed.
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
## 2016-10-24 NOTE — ED Notes (Signed)
TTS speaking with patient. 
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
## 2016-10-24 NOTE — BH Assessment (Signed)
[facility] Assessment Progress Note  Per Thedore MinsMojeed Akintayo, MD, this pt requires psychiatric hospitalization.   Malva LimesLinsey Strader, RN, [facility]; they will be ready to receive pt at 17:30.  Pt presents under IVC initiated by law enforcement, which Dr Jannifer FranklinAkintayo has upheld, and [facility].  Pt's nurse, Morrie Sheldonshley, has been notified, and agrees to call report to [phone].  Pt is to be transported via [employer]law enforcement.   Doylene Canninghomas Joycie Aerts, MA Triage Specialist [phone]

## 2016-10-24 NOTE — Tx Team (Signed)
Initial Treatment Plan 10/24/2016 8:51 PM Amy [name] ZOX:[id number]    PATIENT STRESSORS: Educational concerns Financial difficulties Substance abuse   PATIENT STRENGTHS: Wellsite geologistCommunication skills General fund of knowledge Supportive family/friends   PATIENT IDENTIFIED PROBLEMS: Psychosis  Substance abuse (marijuana)  Agitation  "Be able to handle what people tell me..I want them to tell me the truth"  "Get better"             DISCHARGE CRITERIA:  Ability to meet basic life and health needs Improved stabilization in mood, thinking, and/or behavior Motivation to continue treatment in a less acute level of care Verbal commitment to aftercare and medication compliance  PRELIMINARY DISCHARGE PLAN: Outpatient therapy Medication management  PATIENT/FAMILY INVOLVEMENT: This treatment plan has been presented to and reviewed with the patient, Amy [name].  The patient and family have been given the opportunity to ask questions and make suggestions.  Levin BaconHeather V Kiam Bransfield, RN 10/24/2016, 8:51 PM
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
## 2016-10-24 NOTE — ED Notes (Signed)
Pt transported to [facility] by [employer] for continuation of specialized care. Pt left in no acute distress. Belongings signed for and given to [employer] officer. Pt left in no acute distress. 

## 2016-10-25 ENCOUNTER — Encounter (HOSPITAL_COMMUNITY): Payer: Self-pay | Admitting: Psychiatry

## 2016-10-25 DIAGNOSIS — F1410 Cocaine abuse, uncomplicated: Secondary | ICD-10-CM | POA: Clinically undetermined

## 2016-10-25 DIAGNOSIS — F1220 Cannabis dependence, uncomplicated: Secondary | ICD-10-CM

## 2016-10-25 DIAGNOSIS — G4700 Insomnia, unspecified: Secondary | ICD-10-CM

## 2016-10-25 DIAGNOSIS — F3164 Bipolar disorder, current episode mixed, severe, with psychotic features: Secondary | ICD-10-CM | POA: Diagnosis present

## 2016-10-25 LAB — LIPID PANEL
CHOLESTEROL: 123 mg/dL (ref 0–200)
HDL: 39 mg/dL — ABNORMAL LOW (ref >40)
LDL Cholesterol: 61 mg/dL (ref 0–99)
Total CHOL/HDL Ratio: 3.2 RATIO
Triglycerides: 115 mg/dL (ref <150)
VLDL: 23 mg/dL (ref 0–40)

## 2016-10-25 LAB — T4, FREE: FREE T4: 1.11 ng/dL (ref 0.61–1.12)

## 2016-10-25 LAB — TSH: TSH: 0.33 u[IU]/mL — ABNORMAL LOW (ref 0.350–4.500)

## 2016-10-25 MED ORDER — ZIPRASIDONE HCL 20 MG PO CAPS
20.0000 mg | ORAL_CAPSULE | Freq: Two times a day (BID) | ORAL | Status: DC | PRN
  Administered 2016-10-25: 20 mg via ORAL
  Filled 2016-10-25: qty 1

## 2016-10-25 MED ORDER — NICOTINE POLACRILEX 2 MG MT GUM
2.0000 mg | CHEWING_GUM | OROMUCOSAL | Status: DC | PRN
  Administered 2016-10-29: 2 mg via ORAL
  Filled 2016-10-25: qty 1

## 2016-10-25 MED ORDER — HYDROXYZINE HCL 25 MG PO TABS
25.0000 mg | ORAL_TABLET | ORAL | Status: DC | PRN
  Administered 2016-10-28 – 2016-10-30 (×4): 25 mg via ORAL
  Filled 2016-10-25 (×4): qty 1
  Filled 2016-10-25: qty 10
  Filled 2016-10-25: qty 1

## 2016-10-25 MED ORDER — ZIPRASIDONE MESYLATE 20 MG IM SOLR: 10.0000 mg | Freq: Two times a day (BID) | INTRAMUSCULAR | Status: DC | PRN

## 2016-10-25 NOTE — [facility] Group Notes (Signed)
[facility] Group Notes:  (Counselor/Nursing/MHT/Case Management/Adjunct)  10/25/2016 1:15PM  Type of Therapy:  Group Therapy  Participation Level:  Active  Participation Quality:  Appropriate  Affect:  Flat  Cognitive:  Oriented  Insight:  Improving  Engagement in Group:  Limited  Engagement in Therapy:  Limited  Modes of Intervention:  Discussion, Exploration and Socialization  Summary of Progress/Problems: The topic for group was balance in life.  Pt participated in the discussion about when their life was in balance and out of balance and how this feels.  Pt discussed ways to get back in balance and short term goals they can work on to get where they want to be. Stayed the entire time, engaged throughout.  "I am unbalanced.  I am hurt because my mother does not believe me."  Tearful.  Monopolizing.  Takes others feedback and suggestions and "yes, but's" all of it.  Pressured speech throughout, circumstantial and tangential.  Many examples of how she feels people do have taken advantage of her good intentions, and blames it on them.  Limited insight, lots of drama.  Daryel Geraldorth, Rodric Punch B 10/25/2016 4:20 PM
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
## 2016-10-25 NOTE — Progress Notes (Signed)
  DATA ACTION RESPONSE  Objective- Pt. is visible in the dayroom, seen interacting with peers. Presents with an anxious     affect and mood. Brightens on approach. Was requesting a family session prior to d/c. No further c/o. No abnormal s/s.  Subjective- Denies having any SI/HI/AVH/Pain at this time. Pt. states " I just want my mom to love me more; I have mix emotions". Is cooperative and remain safe on the unit.  1:1 interaction in private to establish rapport. Encouragement, education, & support given from staff.    Safety maintained with Q 15 checks. Continue with POC.

## 2016-10-25 NOTE — Progress Notes (Signed)
Recreation Therapy Notes  Date: 10/25/16 Time: 1000 Location: 500 Hall Dayroom  Group Topic: Self-Esteem  Goal Area(s) Addresses:  Patient will identify positive ways to increase self-esteem. Patient will verbalize benefit of increased self-esteem.  Intervention: Colored pencils, blank crest  Activity: Crest of Arms.  Patients were to fill in the crest with things they value, things they love or anything they feel represents them.  Education: Self-Esteem, Discharge Planning.   Education Outcome: Acknowledges education/In group clarification offered/Needs additional education  Clinical Observations/Feedback: Pt did not attend group.   Lindie Roberson, LRT/CTRS         Erice Ahles A 10/25/2016 12:45 PM 

## 2016-10-25 NOTE — [facility] Suicide Risk Assessment (Signed)
[facility]   Nursing information obtained from:  Patient Demographic factors:  Adolescent or young adult Current Mental Status:  NA Loss Factors:  Financial problems / change in socioeconomic status Historical Factors:  Prior suicide attempts Risk Reduction Factors:  Living with another person, especially a relative  Total Time spent with patient: 20 minutes Principal Problem: Bipolar disorder, curr episode mixed, severe, with psychotic features (HCC) Diagnosis:   Patient Active Problem List   Diagnosis Date Noted  . Bipolar disorder, curr episode mixed, severe, with psychotic features (HCC) [F31.64] 10/25/2016  . Cocaine use disorder, mild, abuse [F14.10] 10/25/2016  . Cannabis use disorder, severe, dependence (HCC) [F12.20] 10/25/2016  . Insomnia [G47.00]   . Anxiety state [F41.1]   . Cocaine abuse [F14.10] 07/04/2015  . Cannabis abuse, continuous [F12.10] 07/02/2015  . Non compliance with medical treatment [Z91.19] 06/18/2015   Subjective Data: Please see H&P.   Continued Clinical Symptoms:  Alcohol Use Disorder Identification Test Final Score (AUDIT): 2 The "Alcohol Use Disorders Identification Test", Guidelines for Use in Primary Care, Second Edition.  World Science writerHealth Organization [facility](WHO). Score between 0-7:  no or low risk or alcohol related problems. Score between 8-15:  moderate risk of alcohol related problems. Score between 16-19:  high risk of alcohol related problems. Score 20 or above:  warrants further diagnostic evaluation for alcohol dependence and treatment.   CLINICAL FACTORS:   Bipolar Disorder:   Mixed State Alcohol/Substance Abuse/Dependencies Unstable or Poor Therapeutic Relationship   Musculoskeletal: Strength & Muscle Tone: within normal limits Gait & Station: normal Patient leans: [name]  Psychiatric Specialty Exam: Physical Exam  ROS  Blood pressure 109/79, pulse 100, temperature 98.1 F (36.7 C), temperature source Oral,  resp. rate 16, height 6\' 1"  (1.854 m), weight 89.1 kg (196 lb 8 oz), last menstrual period 10/05/2016, SpO2 99 %.Body mass index is 25.93 kg/m.                  Please see H&P.                                         COGNITIVE FEATURES THAT CONTRIBUTE TO RISK:  Closed-mindedness, Polarized thinking and Thought constriction (tunnel vision)    SUICIDE RISK:   Moderate:  Frequent suicidal ideation with limited intensity, and duration, some specificity in terms of plans, no associated intent, good self-control, limited dysphoria/symptomatology, some risk factors present, and identifiable protective factors, including available and accessible social support.  PLAN OF CARE: Please see H&P.   I certify that inpatient services furnished can reasonably be expected to improve the patient's condition.   Parvin Stetzer, MD 10/25/2016, 2:06 PM

## 2016-10-25 NOTE — Progress Notes (Signed)
Patient ID: Amy NajjarAdantre C [name], female   DOB: 1996/05/05, 21 y.o.   MRN: [id number]  DAR: Pt. Denies SI/HI and A/V Hallucinations. She reports pain in her right knee and received PRN Tylenol. This afternoon she was heard yelling and screaming into the telephone. Nursing staff encouraged patient to end the call as it was distressing to her. Patient threw the phone down and stormed off to her room. PRN Geodon PO was administered to patient and patient received some relief on reassessment. Support and encouragement provided to the patient. Patient is labile in mood today. She is calm and then experiencing agitation. She is experiencing crying spells as well. She refused to fill out her daily inventory sheet. Q15 minute checks are maintained for safety.

## 2016-10-25 NOTE — Plan of Care (Signed)
Problem: Safety: Goal: Periods of time without injury will increase Outcome: Progressing Pt. remains a high fall risk, denies SI/HI/AVH at this time, Q 15 checks in effect.    

## 2016-10-26 DIAGNOSIS — Z560 Unemployment, unspecified: Secondary | ICD-10-CM

## 2016-10-26 DIAGNOSIS — F17210 Nicotine dependence, cigarettes, uncomplicated: Secondary | ICD-10-CM

## 2016-10-26 DIAGNOSIS — F419 Anxiety disorder, unspecified: Secondary | ICD-10-CM

## 2016-10-26 LAB — PROLACTIN: Prolactin: 33.3 ng/mL — ABNORMAL HIGH (ref 4.8–23.3)

## 2016-10-26 LAB — HEMOGLOBIN A1C
Hgb A1c MFr Bld: 4.8 % (ref 4.8–5.6)
Mean Plasma Glucose: 91 mg/dL

## 2016-10-26 LAB — T3: T3, Total: 101 ng/dL (ref 71–180)

## 2016-10-26 MED ORDER — HALOPERIDOL 5 MG PO TABS
5.0000 mg | ORAL_TABLET | Freq: Four times a day (QID) | ORAL | Status: DC | PRN
  Administered 2016-10-26 – 2016-10-31 (×5): 5 mg via ORAL
  Filled 2016-10-26 (×5): qty 1

## 2016-10-26 MED ORDER — ARIPIPRAZOLE 10 MG PO TABS
10.0000 mg | ORAL_TABLET | Freq: Once | ORAL | Status: AC
  Administered 2016-10-26: 10 mg via ORAL
  Filled 2016-10-26 (×2): qty 1

## 2016-10-26 MED ORDER — ARIPIPRAZOLE 10 MG PO TABS
20.0000 mg | ORAL_TABLET | Freq: Every day | ORAL | Status: DC
  Administered 2016-10-27 – 2016-11-01 (×6): 20 mg via ORAL
  Filled 2016-10-26 (×8): qty 2

## 2016-10-26 MED ORDER — HALOPERIDOL LACTATE 5 MG/ML IJ SOLN: 5.0000 mg | Freq: Four times a day (QID) | INTRAMUSCULAR | Status: DC | PRN

## 2016-10-26 MED ORDER — LORAZEPAM 1 MG PO TABS
2.0000 mg | ORAL_TABLET | Freq: Once | ORAL | Status: AC
  Administered 2016-10-26: 2 mg via ORAL
  Filled 2016-10-26: qty 2

## 2016-10-26 NOTE — Progress Notes (Signed)
DAR NOTE: Patient presents with anxious affect and agitated over phone conversation with mother.  Patient observed yelling and screaming over the phone.  Patient states "mother don't care for her and is refusing to pay for her medication."  Patient appears disorganized, delusional and tangential in speech.  Denies pain, auditory and visual hallucinations.  Rates depression at 3, hopelessness at 3, and anxiety at 3.  Maintained on routine safety checks.  Medications given as prescribed.  Support and encouragement offered as needed.  Attended group and participated.  States goal for today is "get rid of mind picks and cleanness."  Patient observed socializing with peers in the dayroom.  Haldol 5 mg and Benadryl 50 mg given for severe agitation with fair effect.  Patient attempted to leave the unit asking to see only "white doctors."  MD assessed patient.  Ativan 2 mg given for agitation with good effect.
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
## 2016-10-26 NOTE — Progress Notes (Signed)
Patient attended karaoke group tonight.  
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
## 2016-10-26 NOTE — Progress Notes (Signed)
Recreation Therapy Notes  Date: 10/26/16 Time: 1000 Location: 500 Hall Dayroom  Group Topic: Wellness  Goal Area(s) Addresses:  Patient will define components of whole wellness. Patient will verbalize benefit of whole wellness.  Behavioral Response: Engaged  Intervention:  10 plastic cups, 2 soft foam balls   Activity: Bowling.  Patients were divided into two teams.  Each cup the player knocked down counted as one point.  Each player was given two chances to knock down the cups.  If a player got a strike on their first try, then it would be the next person's turn.  The team with the highest score wins the game.     Education: Wellness, Building control surveyorDischarge Planning.   Education Outcome: Acknowledges education/In group clarification offered/Needs additional education.   Clinical Observations/Feedback: Pt was bright and engaged during activity.  Pt would socialize with peers when prompted.  Pt did express that she goes bowling often.  Pt showed good technique and was able to focus.   Caroll RancherMarjette Aulton Routt, LRT/CTRS      Lillia AbedLindsay, Rainer Mounce A 10/26/2016 11:58 AM

## 2016-10-26 NOTE — [facility] Suicide Risk Assessment (Signed)
[facility] INPATIENT:  Family/Significant Other Suicide Prevention Education  Suicide Prevention Education:  Education Completed; Alen BlewLori Malek, [phone]  has been identified by the patient as the family member/significant other with whom the patient will be residing, and identified as the person(s) who will aid the patient in the event of a mental health crisis (suicidal ideations/suicide attempt).  With written consent from the patient, the family member/significant other has been provided the following suicide prevention education, prior to the and/or following the discharge of the patient.  The suicide prevention education provided includes the following:  Suicide risk factors  Suicide prevention and interventions  National Suicide Hotline telephone number  [facility] assessment telephone number  [facility] Emergency Assistance 911  [facility] and/or Residential Mobile Crisis Unit telephone number  Request made of family/significant other to:  Remove weapons (e.g., guns, rifles, knives), all items previously/currently identified as safety concern.    Remove drugs/medications (over-the-counter, prescriptions, illicit drugs), all items previously/currently identified as a safety concern.  The family member/significant other verbalizes understanding of the suicide prevention education information provided.  The family member/significant other agrees to remove the items of safety concern listed above. Mother shared that she never follows through with outpt follow up nor taking medications. She won't help herself or accept the fact that she has a mental health disorder. We talked about applying for guardianship, disability and referring her to ACT team.  Ida Rogueodney B Sharlie Shreffler 10/26/2016, 4:16 PM

## 2016-10-26 NOTE — Plan of Care (Signed)
Problem: Safety: Goal: Periods of time without injury will increase Outcome: Progressing Patient is safe from injury.  Routine safety checks maintained every 15 minutes.

## 2016-10-26 NOTE — Progress Notes (Signed)
Nursing Progress Note 1900-0730  D) Patient presents with irritable and labile affect/mood. Patient attended karaoke and was seen in the milieu. Patient asked multiple staff members for a meal tray after having snacks during group. Patient became irritable and agitated when requests were denied. Patient observed on the telephone becoming agitated and yelling. At 2235, patient began yelling and crying on the phone. Patient reports to Clinical research associatewriter "I can't wait to go home to talk to my mom in person". Patient agreed to take PRN medications for anxiety and agitation. Patient denies SI/HI/AVH or pain. Patient contracts for safety on the unit. Patient reports sleeping well with current regimen.  A)  Emotional support given. 1:1 interaction and active listening provided. Patient medicated as prescribed. Medications and plan of care reviewed with patient. Patient verbalized understanding without further questions. Snacks and fluids provided. Opportunities for questions or concerns presented to patient. Patient encouraged to continue to work on treatment goals. Labs, vital signs and patient behavior monitored throughout shift. Patient safety maintained with q15 min safety checks. High fall risk precautions in place and reviewed with patient; patient verbalized understanding.  R) Patient receptive to interaction with nurse. Patient remains safe on the unit at this time. Patient denies any adverse medication reactions at this time. Patient is resting in bed. Patient currently calm on the unit. Will continue to monitor.

## 2016-10-26 NOTE — Plan of Care (Signed)
Problem: Health Behavior/Discharge Planning: Goal: Compliance with treatment plan for underlying cause of condition will improve Outcome: Progressing Patient became agitated on the phone this evening and was yelling/crying in the hall. Patient agreed to take PO PRN medications for anxiety/agitation. Patient response improved after receiving medications.
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
## 2016-10-26 NOTE — [facility] Group Notes (Signed)
[facility]  10/26/2016 , 1:44 PM    Type of Therapy:  [facility] Presentation  Participation Level:  Active  Participation Quality:  Attentive  Affect:  Blunted  Cognitive:  Oriented  Insight:  Limited  Engagement in Therapy:  Engaged  Modes of Intervention:  Discussion, Education and Socialization  Summary of Progress/Problems:  Onalee HuaDavid from [facility] [employer] [facility] came to present his recovery story and play the guitar.  Was out in the hall wailing when group started.  She entered quietly about 10 minutes later.  Tearful through most of the group. Blurted out at one point in a pressured, disorganized manner.  Speaker was able to reassure her and redirect her. She had several more outbursts, but each time she saved it up until she was able to leave the group room and yell at the nursing staff. "I want my mother to accept me and my 8 children, which I will have some day." Daryel Geraldorth, Travarus Trudo B 10/26/2016 , 1:44 PM

## 2016-10-26 NOTE — Tx Team (Signed)
Interdisciplinary Treatment and Diagnostic Plan Update  10/26/2016 Time of Session: 8:40 AM  Amy [name] MRN: [id number]  Principal Diagnosis: Bipolar disorder, curr episode mixed, severe, with psychotic features ([place])  Secondary Diagnoses: Principal Problem:   Bipolar disorder, curr episode mixed, severe, with psychotic features (Amy [name]) Active Problems:   Cocaine use disorder, mild, abuse   Cannabis use disorder, severe, dependence (Amy [name])   Current Medications:  Current Facility-Administered Medications  Medication Dose Route Frequency Provider Last Rate Last Dose  . acetaminophen (TYLENOL) tablet 650 mg  650 mg Oral Q6H PRN Nanci Pina, FNP   650 mg at 10/25/16 6073  . alum & mag hydroxide-simeth (MAALOX/MYLANTA) 200-200-20 MG/5ML suspension 30 mL  30 mL Oral Q4H PRN Starkes, Takia S, FNP      . diphenhydrAMINE (BENADRYL) capsule 50 mg  50 mg Oral Q6H PRN Nanci Pina, FNP      . hydrOXYzine (ATARAX/VISTARIL) tablet 25 mg  25 mg Oral Q4H PRN Eappen, Saramma, MD      . magnesium hydroxide (MILK OF MAGNESIA) suspension 30 mL  30 mL Oral Daily PRN Starkes, Takia S, FNP      . nicotine polacrilex (NICORETTE) gum 2 mg  2 mg Oral PRN Eappen, Saramma, MD      . OLANZapine zydis (ZYPREXA) disintegrating tablet 10 mg  10 mg Oral QHS Nanci Pina, FNP   10 mg at 10/25/16 2103  . traZODone (DESYREL) tablet 100 mg  100 mg Oral QHS,MR X 1 Nanci Pina, FNP   100 mg at 10/25/16 2103  . ziprasidone (GEODON) capsule 20 mg  20 mg Oral BID PRN Ursula Alert, MD   20 mg at 10/25/16 1452   Or  . ziprasidone (GEODON) injection 10 mg  10 mg Intramuscular BID PRN Ursula Alert, MD        PTA Medications: No prescriptions prior to admission.    Treatment Modalities: Medication Management, Group therapy, Case management,  1 to 1 session with clinician, Psychoeducation, Recreational therapy.   Physician Treatment Plan for Primary Diagnosis: Bipolar disorder, curr episode mixed,  severe, with psychotic features ([place]) Long Term Goal(s): Improvement in symptoms so as ready for discharge  Short Term Goals: Ability to verbalize feelings will improve Compliance with prescribed medications will improve Ability to identify triggers associated with substance abuse/mental health issues will improve Ability to verbalize feelings will improve Compliance with prescribed medications will improve Ability to identify triggers associated with substance abuse/mental health issues will improve  Medication Management: Evaluate patient's response, side effects, and tolerance of medication regimen.  Therapeutic Interventions: 1 to 1 sessions, Unit Group sessions and Medication administration.  Evaluation of Outcomes: Progressing  Physician Treatment Plan for Secondary Diagnosis: Principal Problem:   Bipolar disorder, curr episode mixed, severe, with psychotic features ([place]) Active Problems:   Cocaine use disorder, mild, abuse   Cannabis use disorder, severe, dependence ([place])   Long Term Goal(s): Improvement in symptoms so as ready for discharge  Short Term Goals: Ability to verbalize feelings will improve Compliance with prescribed medications will improve Ability to identify triggers associated with substance abuse/mental health issues will improve Ability to verbalize feelings will improve Compliance with prescribed medications will improve Ability to identify triggers associated with substance abuse/mental health issues will improve  Medication Management: Evaluate patient's response, side effects, and tolerance of medication regimen.  Therapeutic Interventions: 1 to 1 sessions, Unit Group sessions and Medication administration.  Evaluation of Outcomes: Progressing   RN Treatment Plan for Primary  Diagnosis: Bipolar disorder, curr episode mixed, severe, with psychotic features (Amy [name]) Long Term Goal(s): Knowledge of disease and therapeutic regimen to maintain health will  improve  Short Term Goals: Ability to identify and develop effective coping behaviors will improve and Compliance with prescribed medications will improve  Medication Management: RN will administer medications as ordered by provider, will assess and evaluate patient's response and provide education to patient for prescribed medication. RN will report any adverse and/or side effects to prescribing provider.  Therapeutic Interventions: 1 on 1 counseling sessions, Psychoeducation, Medication administration, Evaluate responses to treatment, Monitor vital signs and CBGs as ordered, Perform/monitor CIWA, COWS, AIMS and Fall Risk screenings as ordered, Perform wound care treatments as ordered.  Evaluation of Outcomes: Met   LCSW Treatment Plan for Primary Diagnosis: Bipolar disorder, curr episode mixed, severe, with psychotic features (Amy [name]) Long Term Goal(s): Safe transition to appropriate next level of care at discharge, Engage patient in therapeutic group addressing interpersonal concerns.  Short Term Goals: Engage patient in aftercare planning with referrals and resources  Therapeutic Interventions: Assess for all discharge needs, 1 to 1 time with Social worker, Explore available resources and support systems, Assess for adequacy in community support network, Educate family and significant other(s) on suicide prevention, Complete Psychosocial Assessment, Interpersonal group therapy.  Evaluation of Outcomes: Met  Return home, follow up outpt   Progress in Treatment: Attending groups: Yes Participating in groups: Yes Taking medication as prescribed: Yes Toleration medication: Yes, no side effects reported at this time Family/Significant other contact made: No Patient understands diagnosis: No limited insight Discussing patient identified problems/goals with staff: Yes Medical problems stabilized or resolved: Yes Denies suicidal/homicidal ideation: Yes Issues/concerns per patient  self-inventory: None Other: [name]  New problem(s) identified: None identified at this time.   New Short Term/Long Term Goal(s): None identified at this time.   Discharge Plan or Barriers:   Reason for Continuation of Hospitalization:  [place]ia  Medication stabilization   Estimated Length of Stay: 3-5 days  Attendees: Patient: 10/26/2016  8:40 AM  Physician: Ursula Alert, MD 10/26/2016  8:40 AM  Nursing: Hoy Register, RN 10/26/2016  8:40 AM  RN Care Manager: Lars Pinks, RN 10/26/2016  8:40 AM  Social Worker: Ripley Fraise 10/26/2016  8:40 AM  Recreational Therapist: Laretta Bolster  10/26/2016  8:40 AM  Other: Norberto Sorenson 10/26/2016  8:40 AM  Other:  10/26/2016  8:40 AM    Scribe for Treatment Team:  Roque Lias LCSW 10/26/2016 8:40 AM

## 2016-10-27 MED ORDER — LITHIUM CARBONATE 300 MG PO CAPS
900.0000 mg | ORAL_CAPSULE | Freq: Every day | ORAL | Status: DC
  Administered 2016-10-27 – 2016-10-31 (×5): 900 mg via ORAL
  Filled 2016-10-27: qty 21
  Filled 2016-10-27 (×8): qty 3

## 2016-10-27 NOTE — [facility] Group Notes (Signed)
[facility] LCSW Group Therapy  10/27/2016  1:05 PM  Type of Therapy:  Group therapy  Participation Level:  Active  Participation Quality:  Attentive  Affect:  Flat  Cognitive:  Oriented  Insight:  Limited  Engagement in Therapy:  Limited  Modes of Intervention:  Discussion, Socialization  Summary of Progress/Problems:  Chaplain was here to lead a group on themes of hope and courage.  Invited.  Chose to not attend.  Daryel Geraldorth, Wolfgang Finigan B 10/27/2016 2:00 PM

## 2016-10-27 NOTE — Progress Notes (Signed)
Pt attend group. Her day was 10. Her goal was to be happy and move out with family .

## 2016-10-27 NOTE — Progress Notes (Signed)
DAR NOTE: Patient presents with calm affect and mood is appropriate to situation.  Denies  auditory and visual hallucinations.  Described energy level as high and concentration as good.  Rates depression at 1, hopelessness at 1, and anxiety at 1.  Maintained on routine safety checks.  Medications given as prescribed.  Support and encouragement offered as needed.  Attended group and participated.  States goal for today is "be good."  Patient observed socializing with peers in the dayroom.  Offered no complaint.

## 2016-10-27 NOTE — Progress Notes (Signed)
[facility] MD Progress Note  10/27/2016 5:00 PM Amy [name]  MRN:  [id number] Subjective:  21 yo AAF, single, unemployed. Background history of Bipolar Disorder and SUD. She presented in company of the police. She was reported to have been in an argument with a man she met online. She was reported to have become threatening and agitated. She was very circumstantial and noted to be manic. She has not been adherent with medications.  UDS was positive for cocaine and THC.  Chart reviewed today. Patient discussed at team  Staff reports that she is relatively calmer. She is tolerating the milieu more. Has not voiced any delusions lately. Attention span is still low. Still rambles. No aggressive behavior. SW reports that her family wants her on LAI.   Seen today. Calmer and more pleasant. Wants to know how soon she can go back home. Thought is slower. More on topic. No delusional theme. Not reporting any perceptual abnormality. Not expressing any suicidal thought. No violent thought. I discussed addition of Lithium to current regimen. She consented to treatment after we explored the risks and benefits.    Principal Problem: Bipolar disorder, curr episode mixed, severe, with psychotic features ([place]) Diagnosis:   Patient Active Problem List   Diagnosis Date Noted  . Bipolar disorder, curr episode mixed, severe, with psychotic features ([place]) [F31.64] 10/25/2016  . Cocaine use disorder, mild, abuse [F14.10] 10/25/2016  . Cannabis use disorder, severe, dependence ([place]) [F12.20] 10/25/2016  . Insomnia [G47.00]   . Anxiety state [F41.1]   . Cocaine abuse [F14.10] 07/04/2015  . Cannabis abuse, continuous [F12.10] 07/02/2015  . Non compliance with medical treatment [Z91.19] 06/18/2015   Total Time spent with patient: 20 minutes  Past Psychiatric History: As in H&P  Past Medical History:  Past Medical History:  Diagnosis Date  . Depression    History reviewed. No pertinent surgical history. Family  History:  Family History  Problem Relation Age of Onset  . Mental illness Neg Hx    Family Psychiatric  History: As in H&P Social History:  History  Alcohol Use No     History  Drug Use  . Types: Marijuana    Comment: denies    Social History   Social History  . Marital status: Single    Spouse name: [name]  . Number of children: [name]  . Years of education: [name]   Social History Main Topics  . Smoking status: Current Every Day Smoker    Types: Cigarettes  . Smokeless tobacco: Never Used  . Alcohol use No  . Drug use: Yes    Types: Marijuana     Comment: denies  . Sexual activity: Not Asked   Other Topics Concern  . None   Social History Narrative  . None   Additional Social History:    Pain Medications: No abuse reported Prescriptions: SEE MAR Over the Counter: No abuse reported History of alcohol / drug use?: Yes Negative Consequences of Use: Financial, Personal relationships Withdrawal Symptoms:  (none) Name of Substance 1: CANNABIS 1 - Age of First Use: TEENS 1 - Amount (size/oz): UNKNOWN 1 - Frequency: DAILY 1 - Duration: ONGOING 1 - Last Use / Amount: 10/23/16 Name of Substance 2: COCAINE 2 - Age of First Use: TEENS 2 - Amount (size/oz): UNKNOWN 2 - Frequency: 2 TIMES SINCE JANUARY 2018 2 - Duration: ONGOING 2 - Last Use / Amount: LAST WEEK Name of Substance 3: ALCOHOL 3 - Age of First Use: TEENS 3 - Amount (size/oz):  1-2 BEERS 3 - Frequency: "A COUPLE OF TIMES A WEEK WITH MY FRIEND" 3 - Duration: ONGOING 3 - Last Use / Amount: "A FEW DAYS AGO" Name of Substance 4: NICOTINE/CIGARETTES 4 - Age of First Use: TEENS 4 - Amount (size/oz): 1/2 PACK 4 - Frequency: DAILY 4 - Duration: ONGOING 4 - Last Use / Amount: 10/23/16 Name of Substance 5: AMPHETAMINES-ADDERALL 5 - Age of First Use: TEENS 5 - Amount (size/oz): UNKNOWN 5 - Frequency: UNKNOWN 5 - Duration: UNKNOWN 5 - Last Use / Amount: STOPPED IN NOV OR DEC 2017          Sleep:  Poor  Appetite:  Good  Current Medications: Current Facility-Administered Medications  Medication Dose Route Frequency Provider Last Rate Last Dose  . acetaminophen (TYLENOL) tablet 650 mg  650 mg Oral Q6H PRN Nanci Pina, FNP   650 mg at 10/25/16 4268  . alum & mag hydroxide-simeth (MAALOX/MYLANTA) 200-200-20 MG/5ML suspension 30 mL  30 mL Oral Q4H PRN Nanci Pina, FNP      . ARIPiprazole (ABILIFY) tablet 20 mg  20 mg Oral Daily Azel Gumina, Laruth Bouchard, MD   20 mg at 10/27/16 1033  . diphenhydrAMINE (BENADRYL) capsule 50 mg  50 mg Oral Q6H PRN Nanci Pina, FNP   50 mg at 10/26/16 2258  . haloperidol (HALDOL) tablet 5 mg  5 mg Oral Q6H PRN Artist Beach, MD   5 mg at 10/26/16 2258   Or  . haloperidol lactate (HALDOL) injection 5 mg  5 mg Intramuscular Q6H PRN Jayzen Paver, Laruth Bouchard, MD      . hydrOXYzine (ATARAX/VISTARIL) tablet 25 mg  25 mg Oral Q4H PRN Eappen, Saramma, MD      . lithium carbonate capsule 900 mg  900 mg Oral QHS Mykenzie Ebanks A, MD      . magnesium hydroxide (MILK OF MAGNESIA) suspension 30 mL  30 mL Oral Daily PRN Starkes, Takia S, FNP      . nicotine polacrilex (NICORETTE) gum 2 mg  2 mg Oral PRN Eappen, Saramma, MD      . traZODone (DESYREL) tablet 100 mg  100 mg Oral QHS,MR X 1 Starkes, Takia S, FNP   100 mg at 10/26/16 2258    Lab Results:  Results for orders placed or performed during the hospital encounter of 10/24/16 (from the past 48 hour(s))  T3     Status: None   Collection Time: 10/25/16  6:13 PM  Result Value Ref Range   T3, Total 101 71 - 180 ng/dL    Comment: (NOTE) Performed At: [facility] [place], [place] [id number] Lindon Romp MD [id number] Performed at [facility], [place] [address], [place], [place] [address]   T4, free     Status: None   Collection Time: 10/25/16  6:13 PM  Result Value Ref Range   Free T4 1.11 0.61 - 1.12 ng/dL    Comment: (NOTE) Biotin ingestion may  interfere with free T4 tests. If the results are inconsistent with the TSH level, previous test results, or the clinical presentation, then consider biotin interference. If needed, order repeat testing after stopping biotin. Performed at [facility], [place] [address] [place], [place] [address]     Blood Alcohol level:  Lab Results  Component Value Date   [facility] <5 10/23/2016   ETH <5 31/49/7026    Metabolic Disorder Labs: Lab Results  Component Value Date   HGBA1C 4.8 10/25/2016  MPG 91 10/25/2016   MPG 91 12/03/2015   Lab Results  Component Value Date   PROLACTIN 33.3 (H) 10/25/2016   PROLACTIN 66.9 (H) 12/03/2015   Lab Results  Component Value Date   CHOL 123 10/25/2016   TRIG 115 10/25/2016   HDL 39 (L) 10/25/2016   CHOLHDL 3.2 10/25/2016   VLDL 23 10/25/2016   LDLCALC 61 10/25/2016   LDLCALC 50 12/03/2015    Physical Findings: AIMS: Facial and Oral Movements Muscles of Facial Expression: None, normal Lips and Perioral Area: None, normal Jaw: None, normal Tongue: None, normal,Extremity Movements Upper (arms, wrists, hands, fingers): None, normal Lower (legs, knees, ankles, toes): None, normal, Trunk Movements Neck, shoulders, hips: None, normal, Overall Severity Severity of abnormal movements (highest score from questions above): None, normal Incapacitation due to abnormal movements: None, normal Patient's awareness of abnormal movements (rate only patient's report): No Awareness, Dental Status Current problems with teeth and/or dentures?: No Does patient usually wear dentures?: No  CIWA:    COWS:     Musculoskeletal: Strength & Muscle Tone: within normal limits Gait & Station: normal Patient leans: [name]  Psychiatric Specialty Exam: Physical Exam  Constitutional: She is oriented to person, place, and time. No distress.  HENT:  Head: Normocephalic.  Eyes: Pupils are equal, round, and reactive to light.  Neck: Normal range of motion. Neck supple.   Cardiovascular: Normal rate.   Respiratory: Effort normal.  Musculoskeletal: Normal range of motion.  Neurological: She is alert and oriented to person, place, and time.  Skin: Skin is warm and dry.  Psychiatric:  As above     ROS  Blood pressure 109/67, pulse (!) 104, temperature 97.8 F (36.6 C), temperature source Oral, resp. rate 16, height '6\' 1"'  (1.854 m), weight 89.1 kg (196 lb 8 oz), last menstrual period 10/05/2016, SpO2 99 %.Body mass index is 25.93 kg/m.  General Appearance:  Less malodorous, less animated. Good rapport  Eye Contact:  Good  Speech:  Slowing down  Volume:  Normal  Mood:  Less elated  Affect:  Congruent  Thought Process:  More linear  Orientation:  Full (Time, Place, and Person)  Thought Content:  No delusional theme. No hallucination in any modality   Suicidal Thoughts:  No  Homicidal Thoughts:  No  Memory:  Did not assess today  Judgement:  Better  Insight:  Better  Psychomotor Activity:  Slowing down  Concentration:  Distractible   Recall:  Did not assess  Fund of Knowledge:  Fair  Language:  Good  Akathisia:  Negative  Handed:    AIMS (if indicated):     Assets:  Communication Skills Desire for Improvement Physical Health  ADL's:  Intact  Cognition:  WNL  Sleep:  Number of Hours: 6.25     Treatment Plan Summary: Patient is responding to treatment. We have agreed to add a traditional mood stabilizer to her regimen. Would hopefully transition oral Abilify to depot.   Psychiatric: Bipolar I Disorder SUD( Cocaine and THC)  Medical:  Psychosocial:  Limited support Unemployed  PLAN: 1. Lithium 900 mg HS 2. Lithium levels in five days 3. Would explore Abilify Maintenna at next encounter. 4. Continue to monitor mood, behavior and interaction with peers    Artist Beach, MD 10/27/2016, 5:00 PMPatient ID: Francisco Capuchin, female   DOB: [birth year], 21 y.o.   MRN: [id number]

## 2016-10-27 NOTE — Progress Notes (Signed)
Recreation Therapy Notes  Date: 10/27/16 Time: 1000 Location: [address] Dayroom  Group Topic: Communication, Team Building, Problem Solving  Goal Area(s) Addresses:  Patient will effectively work with peer towards shared goal.  Patient will identify skill used to make activity successful.  Patient will identify how skills used during activity can be used to reach post d/c goals.   Behavioral Response: Engaged  Intervention: STEM Activity   Activity: [place] [employer]. Patients were provided the following materials: 5 drinking straws, 5 rubber bands, 5 paper clips, 2 index cards, 2 drinking cups, and 2 toilet paper rolls. Using the provided materials patients were asked to build a launching mechanisms to launch a ping pong ball approximately 12 feet. Patients were divided into teams of 3-5.   Education: Pharmacist, communityocial Skills, Building control surveyorDischarge Planning.   Education Outcome: Acknowledges education/In group clarification offered/Needs additional education.   Clinical Observations/Feedback:  Pt had just woken up but was engaged and active.  Pt worked well with her peers.  Pt gave advice on how to build the launcher and helped to build it.  Pt expressed the skills from the group would help "communicate to get an understanding of what the people around you are saying".    Caroll RancherMarjette Yahshua Thibault, LRT/CTRS         Caroll RancherLindsay, Srihith Aquilino A 10/27/2016 11:47 AM

## 2016-10-28 DIAGNOSIS — F1414 Cocaine abuse with cocaine-induced mood disorder: Secondary | ICD-10-CM

## 2016-10-28 NOTE — Progress Notes (Addendum)
Nursing Progress Note 1900-0730  D) Patient presents calm and cooperative on the unit. Patient became upset during a phone call this evening. Patient was able to calm self down without staff intervention. Patient attended group and was visible in the milieu. Patient denies SI/HI/AVH or pain. Patient contracts for safety on the unit. Patient reports sleeping well with current regimen.   A) Emotional support given. 1:1 interaction and active listening provided. Patient medicated as prescribed. Medications and plan of care reviewed with patient. Patient verbalized understanding without further questions.  Snacks and fluids provided. Opportunities for questions or concerns presented to patient. Patient encouraged to continue to work on treatment goals. Labs, vital signs and patient behavior monitored throughout shift. Patient safety maintained with q15 min safety checks. High fall risk precautions in place and reviewed with patient; patient verbalized understanding.  R) Patient receptive to interaction with nurse. Patient remains safe on the unit at this time. Patient denies any adverse medication reactions at this time. Patient is resting in bed without complaints. Will continue to monitor.

## 2016-10-28 NOTE — Progress Notes (Signed)
D: Pt presents anxious on approach. Pt hostile and agitated this morning after requesting to be discharged today. Pt noted to be easily agitated, yelling, cursing and making racial slurs about "black staff members". Pt thoughts are disorganized and speech tangential. Pt given prn meds this morning do to aggressive behaviors on the unit. Pt denies SI.  A: Medications reviewed with pt. Medications administered as ordered per MD. Verbal support provided. Pt encouraged to attend groups. 15 minute checks performed for safety. R: Pt mood is labile.

## 2016-10-28 NOTE — Progress Notes (Signed)
[facility] MD Progress Note  10/28/2016 4:00 PM Amy [name]  MRN:  [id number]  Subjective: [place] reports, "I want to go home. I don't care any more. I'm not taking Lithium any more. My friends & family all want me home. Why can't I be discharged today".    Objective: Amy [name] is a 21 yo AAF, single, unemployed. Background history of Bipolar Disorder and SUD. She presented in company of the police. She was reported to have been in an argument with a man she met online. She was reported to have become threatening and agitated. She was very circumstantial and noted to be manic. She has not been adherent with medications.  UDS was positive for cocaine and THC.  Chart reviewed today. Patient discussed at team  Staff reports that she is has been upset today demanding to be discharged. She is tolerating the milieu more. Has not voiced any delusions lately. Attention span is still low. Still rambles. No aggressive behavior, just easily irritated & will lash out. SW reports that her family wants her on LAI.   Seen today. Upset & frustrated. Wants to go back home. Thought is slower. More on topic. No delusional theme. Not reporting any perceptual abnormality. Not expressing any suicidal thought. No violent thought. On Lithium currently for mood stabilization. Level due to be drawn on 10-31-16. She consented to treatment after explorinmg the risks and benefits with the attending psychiatrist. However, she is threatening to not take lithium any more if she did get discharged today. Her room is a mess, littered all over. Encouraged to pickup her things off the floor.   Principal Problem: Bipolar disorder, curr episode mixed, severe, with psychotic features ([place]) Diagnosis:   Patient Active Problem List   Diagnosis Date Noted  . Bipolar disorder, curr episode mixed, severe, with psychotic features ([place]) [F31.64] 10/25/2016  . Cocaine use disorder, mild, abuse [F14.10] 10/25/2016  . Cannabis use disorder,  severe, dependence ([place]) [F12.20] 10/25/2016  . Insomnia [G47.00]   . Anxiety state [F41.1]   . Cocaine abuse [F14.10] 07/04/2015  . Cannabis abuse, continuous [F12.10] 07/02/2015  . Non compliance with medical treatment [Z91.19] 06/18/2015   Total Time spent with patient: 15 minutes  Past Psychiatric History: As in H&P  Past Medical History:  Past Medical History:  Diagnosis Date  . Depression    History reviewed. No pertinent surgical history. Family History:  Family History  Problem Relation Age of Onset  . Mental illness Neg Hx    Family Psychiatric  History: As in H&P  Social History:  History  Alcohol Use No     History  Drug Use  . Types: Marijuana    Comment: denies    Social History   Social History  . Marital status: Single    Spouse name: [name]  . Number of children: [name]  . Years of education: [name]   Social History Main Topics  . Smoking status: Current Every Day Smoker    Types: Cigarettes  . Smokeless tobacco: Never Used  . Alcohol use No  . Drug use: Yes    Types: Marijuana     Comment: denies  . Sexual activity: Not Asked   Other Topics Concern  . None   Social History Narrative  . None   Additional Social History:    Pain Medications: No abuse reported Prescriptions: SEE MAR Over the Counter: No abuse reported History of alcohol / drug use?: Yes Negative Consequences of Use: Financial, Personal relationships Withdrawal Symptoms:  (  none) Name of Substance 1: CANNABIS 1 - Age of First Use: TEENS 1 - Amount (size/oz): UNKNOWN 1 - Frequency: DAILY 1 - Duration: ONGOING 1 - Last Use / Amount: 10/23/16 Name of Substance 2: COCAINE 2 - Age of First Use: TEENS 2 - Amount (size/oz): UNKNOWN 2 - Frequency: 2 TIMES SINCE JANUARY 2018 2 - Duration: ONGOING 2 - Last Use / Amount: LAST WEEK Name of Substance 3: ALCOHOL 3 - Age of First Use: TEENS 3 - Amount (size/oz): 1-2 BEERS 3 - Frequency: "A COUPLE OF TIMES A WEEK WITH MY FRIEND" 3 -  Duration: ONGOING 3 - Last Use / Amount: "A FEW DAYS AGO" Name of Substance 4: NICOTINE/CIGARETTES 4 - Age of First Use: TEENS 4 - Amount (size/oz): 1/2 PACK 4 - Frequency: DAILY 4 - Duration: ONGOING 4 - Last Use / Amount: 10/23/16 Name of Substance 5: AMPHETAMINES-ADDERALL 5 - Age of First Use: TEENS 5 - Amount (size/oz): UNKNOWN 5 - Frequency: UNKNOWN 5 - Duration: UNKNOWN 5 - Last Use / Amount: STOPPED IN NOV OR DEC 2017  Sleep: "I slept well last night"  Appetite:  Good  Current Medications: Current Facility-Administered Medications  Medication Dose Route Frequency Provider Last Rate Last Dose  . acetaminophen (TYLENOL) tablet 650 mg  650 mg Oral Q6H PRN Nanci Pina, FNP   650 mg at 10/25/16 8592  . alum & mag hydroxide-simeth (MAALOX/MYLANTA) 200-200-20 MG/5ML suspension 30 mL  30 mL Oral Q4H PRN Nanci Pina, FNP      . ARIPiprazole (ABILIFY) tablet 20 mg  20 mg Oral Daily Izediuno, Laruth Bouchard, MD   20 mg at 10/28/16 0916  . diphenhydrAMINE (BENADRYL) capsule 50 mg  50 mg Oral Q6H PRN Nanci Pina, FNP   50 mg at 10/28/16 0916  . haloperidol (HALDOL) tablet 5 mg  5 mg Oral Q6H PRN Artist Beach, MD   5 mg at 10/28/16 1004   Or  . haloperidol lactate (HALDOL) injection 5 mg  5 mg Intramuscular Q6H PRN Izediuno, Laruth Bouchard, MD      . hydrOXYzine (ATARAX/VISTARIL) tablet 25 mg  25 mg Oral Q4H PRN Ursula Alert, MD   25 mg at 10/28/16 1004  . lithium carbonate capsule 900 mg  900 mg Oral QHS Artist Beach, MD   900 mg at 10/27/16 2102  . magnesium hydroxide (MILK OF MAGNESIA) suspension 30 mL  30 mL Oral Daily PRN Starkes, Takia S, FNP      . nicotine polacrilex (NICORETTE) gum 2 mg  2 mg Oral PRN Eappen, Saramma, MD      . traZODone (DESYREL) tablet 100 mg  100 mg Oral QHS,MR X 1 Starkes, Takia S, FNP   100 mg at 10/27/16 2103   Lab Results:  No results found for this or any previous visit (from the past 48 hour(s)).  Blood Alcohol level:  Lab Results   Component Value Date   ETH <5 10/23/2016   ETH <5 76/39/4320    Metabolic Disorder Labs: Lab Results  Component Value Date   HGBA1C 4.8 10/25/2016   MPG 91 10/25/2016   MPG 91 12/03/2015   Lab Results  Component Value Date   PROLACTIN 33.3 (H) 10/25/2016   PROLACTIN 66.9 (H) 12/03/2015   Lab Results  Component Value Date   CHOL 123 10/25/2016   TRIG 115 10/25/2016   HDL 39 (L) 10/25/2016   CHOLHDL 3.2 10/25/2016   VLDL 23 10/25/2016   LDLCALC 61 10/25/2016  [place] 50 12/03/2015    Physical Findings: AIMS: Facial and Oral Movements Muscles of Facial Expression: None, normal Lips and Perioral Area: None, normal Jaw: None, normal Tongue: None, normal,Extremity Movements Upper (arms, wrists, hands, fingers): None, normal Lower (legs, knees, ankles, toes): None, normal, Trunk Movements Neck, shoulders, hips: None, normal, Overall Severity Severity of abnormal movements (highest score from questions above): None, normal Incapacitation due to abnormal movements: None, normal Patient's awareness of abnormal movements (rate only patient's report): No Awareness, Dental Status Current problems with teeth and/or dentures?: No Does patient usually wear dentures?: No  CIWA:    COWS:     Musculoskeletal: Strength & Muscle Tone: within normal limits Gait & Station: normal Patient leans: [name]  Psychiatric Specialty Exam: Physical Exam  Constitutional: She is oriented to person, place, and time. No distress.  HENT:  Head: Normocephalic.  Eyes: Pupils are equal, round, and reactive to light.  Neck: Normal range of motion. Neck supple.  Cardiovascular: Normal rate.   Respiratory: Effort normal.  Musculoskeletal: Normal range of motion.  Neurological: She is alert and oriented to person, place, and time.  Skin: Skin is warm and dry.  Psychiatric:  As above     Review of Systems  Psychiatric/Behavioral: Positive for depression and substance abuse (Hx. cocaine & THC  use.). Negative for hallucinations, memory loss and suicidal ideas. The patient is nervous/anxious and has insomnia.   : Nurses notes & Vital signs reviewed.  Blood pressure (!) 108/55, pulse 71, temperature 98.2 F (36.8 C), temperature source Oral, resp. rate 16, height '6\' 1"'  (1.854 m), weight 89.1 kg (196 lb 8 oz), last menstrual period 10/05/2016, SpO2 99 %.Body mass index is 25.93 kg/m.  General Appearance:  Less malodorous, less animated. Good rapport  Eye Contact:  Good  Speech: Slowing down  Volume:  Normal  Mood: Angry  Affect:  Congruent  Thought Process:  More linear  Orientation:  Full (Time, Place, and Person)  Thought Content:  No delusional theme. No hallucination in any modality   Suicidal Thoughts:  No  Homicidal Thoughts:  No  Memory:  Did not assess today  Judgement:  Better  Insight:  Better  Psychomotor Activity:  Slowing down  Concentration:  Distractible   Recall:  Did not assess  Fund of Knowledge:  Fair  Language:  Good  Akathisia:  Negative  Handed:    AIMS (if indicated):     Assets:  Communication Skills Desire for Improvement Physical Health  ADL's:  Intact  Cognition:  WNL  Sleep:  Number of Hours: 4.5   Treatment Plan Summary: Patient is responding to treatment. We have agreed to add a traditional mood stabilizer to her regimen. Would hopefully transition oral Abilify to depot. Lithium level to be drawn on 10-31-16.  Psychiatric: Bipolar I Disorder SUD( Cocaine and THC)  Medical:  Psychosocial:  Limited support Unemployed  PLAN: 1. Lithium 900 mg HS 2. Lithium levels in five days (10-31-16). 3. Would explore Abilify Maintenna at next encounter. 4. Continue to monitor mood, behavior and interaction with peers  Encarnacion Slates, NP, PMHNP, FNP-BC 10/28/2016, 4:00 PMPatient ID: Francisco Capuchin, female   DOB: 11/05/1995, 21 y.o.   MRN: [id number]

## 2016-10-28 NOTE — [facility] Group Notes (Signed)
[facility] LCSW Group Therapy  10/28/2016   Type of Therapy:  Group Therapy  Participation Level:  Active  Participation Quality:  Did not attend Group.  Beverly Sessionsywan J Maeghan Canny MSW, LCSW

## 2016-10-28 NOTE — Plan of Care (Signed)
Problem: Coping: Goal: Ability to demonstrate self-control will improve Outcome: Progressing Patient able to calm down after upsetting phone call without staff intervention or medications. Patient able to express needs/concerns appropriately.

## 2016-10-28 NOTE — Progress Notes (Signed)
[facility] Group Notes:  (Nursing/MHT/Case Management/Adjunct)  Date:  10/28/2016  Time:  9:33 PM  Type of Therapy:  Psychoeducational Skills  Participation Level:  Active  Participation Quality:  Attentive  Affect:  Appropriate  Cognitive:  Appropriate  Insight:  Improving  Engagement in Group:  Improving  Modes of Intervention:  Education  Summary of Progress/Problems: Patient states that she is working on getting discharged either tomorrow or on Monday. In addition, she spoke of having issues with her mother and that she feels that she is being treated like a child. She also mentioned that she wants her mother to spend more time with her. Her goal for tomorrow is to have more "stability".   Hazle CocaGOODMAN, Asencion Guisinger S 10/28/2016, 9:33 PM

## 2016-10-29 NOTE — [facility] Group Notes (Signed)
  [facility] LCSW Group Therapy  10/29/2016 10 AM  Type of Therapy:  Group Therapy  Participation Level:  Minimal  Participation Quality:  Attentive  Affect:  Flat  Cognitive:  Alert  Insight:  None shared  Engagement in Therapy:  None noted other than eye contact  Modes of Intervention:  Discussion, Education, Exploration, Socialization and Support  Summary of Progress/Problems: Topic for today was thoughts and feelings regarding discharge. Facilitator -presented psycho education pice on self care after engaging patients in a group warm up.   Carney Bernatherine C Harrill, LCSW

## 2016-10-29 NOTE — Progress Notes (Signed)
Nursing Progress Note: 7p-7a D: Pt currently presents with a labile/anxious/cooperative affect and behavior. Pt states "Yo, my mom don't get me man. Take her off my medical info sharing list and put my man on there. He white and 21 years old and he really cares for me." Interacting appropriately with milieu. Pt reports good sleep with current medication regimen.   A: Pt provided with medications per providers orders. Pt's labs and vitals were monitored throughout the night. Pt supported emotionally and encouraged to express concerns and questions. Pt educated on medications.  R: Pt's safety ensured with 15 minute and environmental checks. Pt currently denies SI/HI/Self Harm and AVH. Pt verbally contracts to seek staff if SI/HI or A/VH occurs and to consult with staff before acting on any harmful thoughts. Will continue to monitor.

## 2016-10-29 NOTE — Progress Notes (Signed)
D: Patient cheerful and pleasant upon approach. Complained of back pain of 8/10. Denies SI/HI, AH/VH at this time. Visible on dayroom. No behavior issues noted.  A: Staff offered support and encouragement as needed. Due/prn med given as ordered. Routine safety checks maintained. Will continue to monitor patient. R: Patient remains safe on unit.

## 2016-10-29 NOTE — Progress Notes (Signed)
D: Pt presents anxious on approach. Pt reported feeling anxious this morning because she really would like to go home today. Pt noted to be less agitated compared to yesterday's episodes. Pt stated that she wish she could have her lithium level checked today so that she can go home tomorrow. Pt encouraged to speak with Nicole KindredAgnes. NP about discharge. Pt denies SI/HI. Pt denies depression and AVH.  A: Medications administered as ordered per MD. Verbal support provided. Pt encouraged to attend groups. 15 minute checks performed for safety. Pt assessed by NP. R: Pt compliant with tx.

## 2016-10-29 NOTE — Progress Notes (Signed)
[facility] MD Progress Note  10/29/2016 5:16 PM Amy [name]  MRN:  [id number]  Subjective: Amy [name] reports, "I'm fine. It looks to me like I will never leave this place. I'm sorry about yesterday. I get very emotional a lot because I don't think that my mother has any time for me. Anytime that I needed her, she is never available. That is part of my problem".  Objective: Amy [name] is a 21 yo AAF, single, unemployed. Background history of Bipolar Disorder and SUD. She presented in company of the police. She was reported to have been in an argument with a man she met online. She was reported to have become threatening and agitated. She was very circumstantial and noted to be manic. She has not been adherent with medications.  UDS was positive for cocaine and THC.  Chart reviewed today. Patient discussed at team Staff reports that she is has been upset & demanding to be discharged. However, today, she seem more reasonable 7 accepting that things cannot always be how she wants them. She is tolerating the milieu more. Has not voiced any delusions lately. Attention span is still low. Still rambles at time. No aggressive behavior, just easily irritated & will lash out. SW reports that her family wants her on LAI.  More on topic. No delusional theme. Not reporting any perceptual abnormality. Not expressing any suicidal thought. No violent thought. On Lithium currently for mood stabilization. Level due to be drawn on 10-31-16. She consented to treatment after explorinmg the risks and benefits with the attending psychiatrist.  Her room remains messy, littered all over. Encouraged to pickup her things off the floor again today.   Principal Problem: Bipolar disorder, curr episode mixed, severe, with psychotic features ([place]) Diagnosis:   Patient Active Problem List   Diagnosis Date Noted  . Bipolar disorder, curr episode mixed, severe, with psychotic features ([place]) [F31.64] 10/25/2016  . Cocaine use disorder, mild,  abuse [F14.10] 10/25/2016  . Cannabis use disorder, severe, dependence ([place]) [F12.20] 10/25/2016  . Insomnia [G47.00]   . Anxiety state [F41.1]   . Cocaine abuse [F14.10] 07/04/2015  . Cannabis abuse, continuous [F12.10] 07/02/2015  . Non compliance with medical treatment [Z91.19] 06/18/2015   Total Time spent with patient: 15 minutes  Past Psychiatric History: As in H&P  Past Medical History:  Past Medical History:  Diagnosis Date  . Depression    History reviewed. No pertinent surgical history. Family History:  Family History  Problem Relation Age of Onset  . Mental illness Neg Hx    Family Psychiatric  History: As in H&P  Social History:  History  Alcohol Use No     History  Drug Use  . Types: Marijuana    Comment: denies    Social History   Social History  . Marital status: Single    Spouse name: [name]  . Number of children: [name]  . Years of education: [name]   Social History Main Topics  . Smoking status: Current Every Day Smoker    Types: Cigarettes  . Smokeless tobacco: Never Used  . Alcohol use No  . Drug use: Yes    Types: Marijuana     Comment: denies  . Sexual activity: Not Asked   Other Topics Concern  . None   Social History Narrative  . None   Additional Social History:    Pain Medications: No abuse reported Prescriptions: SEE MAR Over the Counter: No abuse reported History of alcohol / drug use?: Yes Negative Consequences  of Use: Financial, Personal relationships Withdrawal Symptoms:  (none) Name of Substance 1: CANNABIS 1 - Age of First Use: TEENS 1 - Amount (size/oz): UNKNOWN 1 - Frequency: DAILY 1 - Duration: ONGOING 1 - Last Use / Amount: 10/23/16 Name of Substance 2: COCAINE 2 - Age of First Use: TEENS 2 - Amount (size/oz): UNKNOWN 2 - Frequency: 2 TIMES SINCE JANUARY 2018 2 - Duration: ONGOING 2 - Last Use / Amount: LAST WEEK Name of Substance 3: ALCOHOL 3 - Age of First Use: TEENS 3 - Amount (size/oz): 1-2 BEERS 3 -  Frequency: "A COUPLE OF TIMES A WEEK WITH MY FRIEND" 3 - Duration: ONGOING 3 - Last Use / Amount: "A FEW DAYS AGO" Name of Substance 4: NICOTINE/CIGARETTES 4 - Age of First Use: TEENS 4 - Amount (size/oz): 1/2 PACK 4 - Frequency: DAILY 4 - Duration: ONGOING 4 - Last Use / Amount: 10/23/16 Name of Substance 5: AMPHETAMINES-ADDERALL 5 - Age of First Use: TEENS 5 - Amount (size/oz): UNKNOWN 5 - Frequency: UNKNOWN 5 - Duration: UNKNOWN 5 - Last Use / Amount: STOPPED IN NOV OR DEC 2017  Sleep: Fair  Appetite:  Good  Current Medications: Current Facility-Administered Medications  Medication Dose Route Frequency Provider Last Rate Last Dose  . acetaminophen (TYLENOL) tablet 650 mg  650 mg Oral Q6H PRN Nanci Pina, FNP   650 mg at 10/28/16 1959  . alum & mag hydroxide-simeth (MAALOX/MYLANTA) 200-200-20 MG/5ML suspension 30 mL  30 mL Oral Q4H PRN Nanci Pina, FNP      . ARIPiprazole (ABILIFY) tablet 20 mg  20 mg Oral Daily Izediuno, Laruth Bouchard, MD   20 mg at 10/29/16 0836  . diphenhydrAMINE (BENADRYL) capsule 50 mg  50 mg Oral Q6H PRN Nanci Pina, FNP   50 mg at 10/28/16 0916  . haloperidol (HALDOL) tablet 5 mg  5 mg Oral Q6H PRN Artist Beach, MD   5 mg at 10/28/16 1004   Or  . haloperidol lactate (HALDOL) injection 5 mg  5 mg Intramuscular Q6H PRN Izediuno, Laruth Bouchard, MD      . hydrOXYzine (ATARAX/VISTARIL) tablet 25 mg  25 mg Oral Q4H PRN Ursula Alert, MD   25 mg at 10/29/16 0836  . lithium carbonate capsule 900 mg  900 mg Oral QHS Artist Beach, MD   900 mg at 10/28/16 2112  . magnesium hydroxide (MILK OF MAGNESIA) suspension 30 mL  30 mL Oral Daily PRN Starkes, Takia S, FNP      . nicotine polacrilex (NICORETTE) gum 2 mg  2 mg Oral PRN Eappen, Saramma, MD      . traZODone (DESYREL) tablet 100 mg  100 mg Oral QHS,MR X 1 Starkes, Takia S, FNP   100 mg at 10/28/16 2112   Lab Results:  No results found for this or any previous visit (from the past 48  hour(s)).  Blood Alcohol level:  Lab Results  Component Value Date   ETH <5 10/23/2016   ETH <5 08/03/9456    Metabolic Disorder Labs: Lab Results  Component Value Date   HGBA1C 4.8 10/25/2016   MPG 91 10/25/2016   MPG 91 12/03/2015   Lab Results  Component Value Date   PROLACTIN 33.3 (H) 10/25/2016   PROLACTIN 66.9 (H) 12/03/2015   Lab Results  Component Value Date   CHOL 123 10/25/2016   TRIG 115 10/25/2016   HDL 39 (L) 10/25/2016   CHOLHDL 3.2 10/25/2016   VLDL 23 10/25/2016  LDLCALC 61 10/25/2016   LDLCALC 50 12/03/2015   Physical Findings: AIMS: Facial and Oral Movements Muscles of Facial Expression: None, normal Lips and Perioral Area: None, normal Jaw: None, normal Tongue: None, normal,Extremity Movements Upper (arms, wrists, hands, fingers): None, normal Lower (legs, knees, ankles, toes): None, normal, Trunk Movements Neck, shoulders, hips: None, normal, Overall Severity Severity of abnormal movements (highest score from questions above): None, normal Incapacitation due to abnormal movements: None, normal Patient's awareness of abnormal movements (rate only patient's report): No Awareness, Dental Status Current problems with teeth and/or dentures?: No Does patient usually wear dentures?: No  CIWA:    COWS:     Musculoskeletal: Strength & Muscle Tone: within normal limits Gait & Station: normal Patient leans: [name]  Psychiatric Specialty Exam: Physical Exam  Constitutional: She is oriented to person, place, and time. No distress.  HENT:  Head: Normocephalic.  Eyes: Pupils are equal, round, and reactive to light.  Neck: Normal range of motion. Neck supple.  Cardiovascular: Normal rate.   Respiratory: Effort normal.  Musculoskeletal: Normal range of motion.  Neurological: She is alert and oriented to person, place, and time.  Skin: Skin is warm and dry.  Psychiatric:  As above     Review of Systems  Psychiatric/Behavioral: Positive for  depression and substance abuse (Hx. cocaine & THC use.). Negative for hallucinations, memory loss and suicidal ideas. The patient is nervous/anxious and has insomnia.   : Nurses notes & Vital signs reviewed.  Blood pressure 120/66, pulse 67, temperature 98.2 F (36.8 C), temperature source Oral, resp. rate 16, height _0  (1.854 m), weight 89.1 kg (196 lb 8 oz), last menstrual period 10/05/2016, SpO2 99 %.Body mass index is 25.93 kg/m.  General Appearance:  Less malodorous, less animated. Good rapport  Eye Contact:  Good  Speech: Slowing down  Volume:  Normal  Mood: Angry  Affect:  Congruent  Thought Process:  More linear  Orientation:  Full (Time, Place, and Person)  Thought Content:  No delusional theme. No hallucination in any modality   Suicidal Thoughts:  No  Homicidal Thoughts:  No  Memory:  Did not assess today  Judgement:  Better  Insight:  Better  Psychomotor Activity:  Slowing down  Concentration:  Distractible   Recall:  Did not assess  Fund of Knowledge:  Fair  Language:  Good  Akathisia:  Negative  Handed:    AIMS (if indicated):     Assets:  Communication Skills Desire for Improvement Physical Health  ADL's:  Intact  Cognition:  WNL  Sleep:  Number of Hours: 6.5   Treatment Plan Summary: Patient is responding to treatment. We have agreed to add a traditional mood stabilizer to her regimen. Would hopefully transition oral Abilify to depot. Lithium level to be drawn on 10-31-16.  Will continue today 10/29/16 plan as below except where it is noted.  Psychiatric: Bipolar I Disorder SUD( Cocaine and THC)  Medical:  Psychosocial:  Limited support Unemployed  PLAN: 1. Lithium 900 mg HS 2. Lithium levels in five days (10-31-16). 3. Would explore Abilify Maintenna at next encounter. 4. Continue to monitor mood, behavior and interaction with peers  Encarnacion Slates, NP, PMHNP, FNP-BC 10/29/2016, 5:16 PM  Patient ID: Amy [name], female   DOB: Jun 02, 1995,  21 y.o.   MRN: [id number]

## 2016-10-30 MED ORDER — TRAZODONE HCL 150 MG PO TABS
150.0000 mg | ORAL_TABLET | Freq: Every day | ORAL | Status: DC
  Administered 2016-10-30 – 2016-10-31 (×2): 150 mg via ORAL
  Filled 2016-10-30: qty 7
  Filled 2016-10-30 (×3): qty 1

## 2016-10-30 NOTE — Progress Notes (Signed)
Adult Psychoeducational Group Note  Date:  10/30/2016 Time:  8:58 PM  Group Topic/Focus:  Wrap-Up Group:   The focus of this group is to help patients review their daily goal of treatment and discuss progress on daily workbooks.  Participation Level:  Active  Participation Quality:  Appropriate  Affect:  Appropriate  Cognitive:  Appropriate  Insight: Appropriate  Engagement in Group:  Engaged  Modes of Intervention:  Discussion  Additional Comments: The patient expressed that she attended group.The patient also said that she rates today a 10.  Octavio Mannshigpen, Dejaun Vidrio Lee 10/30/2016, 8:58 PM

## 2016-10-30 NOTE — [facility] Group Notes (Signed)
[facility] LCSW Group Therapy  10/30/2016 1:15 pm  Type of Therapy: Process Group Therapy  Participation Level:  Active  Participation Quality:  Appropriate  Affect:  Flat  Cognitive:  Oriented  Insight:  Improving  Engagement in Group:  Limited  Engagement in Therapy:  Limited  Modes of Intervention:  Activity, Clarification, Education, Problem-solving and Support  Summary of Progress/Problems: Today's group addressed the issue of overcoming obstacles.  Patients were asked to identify their biggest obstacle post d/c that stands in the way of their on-going success, and then problem solve as to how to manage this. Invited.  Chose to not attend.  Ida Rogueorth, Camerin Jimenez B 10/30/2016   4:39 PM

## 2016-10-30 NOTE — Progress Notes (Signed)
Recreation Therapy Notes  Date: 10/30/16 Time: 1000 Location: [address] Dayroom  Group Topic: Coping Skills  Goal Area(s) Addresses:  Patients will be able to identify coping skills. Patients will be able to identify the importance of coping skills. Patients will be able to identify the importance of coping skills post d/c.  Behavioral Response: Engaged  Intervention: Mind map, pencils  Activity: Mind map.  Patients were given a mind map to fill out.  LRT and patients filled in the first 8 boxes together (discipline, school, drugs, health, family, job, relationships and money).  Patients were to then come up with coping skills for each situation on their own.  The group then came back together and LRT filled in the coping skills on the board.  Patients would then fill in any holes they had on their sheets.  Education: PharmacologistCoping Skills, Building control surveyorDischarge Planning.   Education Outcome: Acknowledges understanding/In group clarification offered/Needs additional education.   Clinical Observations/Feedback: Pt identified some of her coping skills as going to the hospital, meditate, study, rehab and workout.   Caroll RancherMarjette Zelene Barga, LRT/CTRS         Lillia AbedLindsay, Brae Schaafsma A 10/30/2016 1:15 PM

## 2016-10-30 NOTE — Progress Notes (Signed)
Nursing Progress Note: 7p-7a D: Pt currently presents with a anxious/labile affect and behavior. Pt states "I want to go home tomorrow, but I really want to get rid of these nightmares." Interacting appropriately with milieu. Pt reports off and on/"nightmarish" sleep with current medication regimen.   A: Pt provided with medications per providers orders. Pt's labs and vitals were monitored throughout the night. Pt supported emotionally and encouraged to express concerns and questions. Pt educated on medications.  R: Pt's safety ensured with 15 minute and environmental checks. Pt currently denies SI/HI/Self Harm and AVH. Pt verbally contracts to seek staff if SI/HI or A/VH occurs and to consult with staff before acting on any harmful thoughts. Will continue to monitor.

## 2016-10-30 NOTE — Progress Notes (Signed)
[facility] Group Notes:  (Nursing/MHT/Case Management/Adjunct)  Date:  10/30/2016  Time:  12:09 AM  Type of Therapy:  Psychoeducational Skills  Participation Level:  Active  Participation Quality:  Appropriate  Affect:  Appropriate  Cognitive:  Appropriate  Insight:  Improving  Engagement in Group:  Improving  Modes of Intervention:  Education  Summary of Progress/Problems: The patient brought up in group that she continues to feel left out when dealing with her mother. She states that her mother told her that she needs to be on disability , but that is not an option for her.   Amy [name] S 10/30/2016, 12:09 AM

## 2016-10-30 NOTE — Progress Notes (Signed)
D: Pt presents with flat affect and depressed mood. Pt reports poor sleep last night due to nightmares. Pt appears less agitated today. Pt denies SI/HI. Pt denies AVH. Pt complaint with taking meds and attending groups today. A: Medications administered as ordered per MD. Verbal support provided. Pt encouraged to attend groups. 15 minute checks performed for safety.  R: Pt compliant with tx.

## 2016-10-30 NOTE — Progress Notes (Signed)
[facility] MD Progress Note  10/30/2016 2:53 PM JAID QUIRION  MRN:  [id number] Subjective:  Patient states " I am getting there. I still have some nervousness.'  Objective;Patient seen and chart reviewed.Discussed patient with treatment team.  Pt today seen as anxious , manic , continues to need support. Per staff she is taking medications, denies ADRs. Will continue to support.    Principal Problem: Bipolar disorder, curr episode mixed, severe, with psychotic features (HCC) Diagnosis:   Patient Active Problem List   Diagnosis Date Noted  . Bipolar disorder, curr episode mixed, severe, with psychotic features (HCC) [F31.64] 10/25/2016  . Cocaine use disorder, mild, abuse [F14.10] 10/25/2016  . Cannabis use disorder, severe, dependence (HCC) [F12.20] 10/25/2016  . Insomnia [G47.00]   . Anxiety state [F41.1]   . Cocaine abuse [F14.10] 07/04/2015  . Cannabis abuse, continuous [F12.10] 07/02/2015  . Non compliance with medical treatment [Z91.19] 06/18/2015   Total Time spent with patient: 25 minutes  Past Psychiatric History: Please see H&P.   Past Medical History:  Past Medical History:  Diagnosis Date  . Depression    History reviewed. No pertinent surgical history. Family History:  Family History  Problem Relation Age of Onset  . Mental illness Neg Hx    Family Psychiatric  History: Please see H&P.  Social History:  History  Alcohol Use No     History  Drug Use  . Types: Marijuana    Comment: denies    Social History   Social History  . Marital status: Single    Spouse name: [name]  . Number of children: [name]  . Years of education: [name]   Social History Main Topics  . Smoking status: Current Every Day Smoker    Types: Cigarettes  . Smokeless tobacco: Never Used  . Alcohol use No  . Drug use: Yes    Types: Marijuana     Comment: denies  . Sexual activity: Not Asked   Other Topics Concern  . None   Social History Narrative  . None   Additional Social  History:    Pain Medications: No abuse reported Prescriptions: SEE MAR Over the Counter: No abuse reported History of alcohol / drug use?: Yes Negative Consequences of Use: Financial, Personal relationships Withdrawal Symptoms:  (none) Name of Substance 1: CANNABIS 1 - Age of First Use: TEENS 1 - Amount (size/oz): UNKNOWN 1 - Frequency: DAILY 1 - Duration: ONGOING 1 - Last Use / Amount: 10/23/16 Name of Substance 2: COCAINE 2 - Age of First Use: TEENS 2 - Amount (size/oz): UNKNOWN 2 - Frequency: 2 TIMES SINCE JANUARY 2018 2 - Duration: ONGOING 2 - Last Use / Amount: LAST WEEK Name of Substance 3: ALCOHOL 3 - Age of First Use: TEENS 3 - Amount (size/oz): 1-2 BEERS 3 - Frequency: "A COUPLE OF TIMES A WEEK WITH MY FRIEND" 3 - Duration: ONGOING 3 - Last Use / Amount: "A FEW DAYS AGO" Name of Substance 4: NICOTINE/CIGARETTES 4 - Age of First Use: TEENS 4 - Amount (size/oz): 1/2 PACK 4 - Frequency: DAILY 4 - Duration: ONGOING 4 - Last Use / Amount: 10/23/16 Name of Substance 5: AMPHETAMINES-ADDERALL 5 - Age of First Use: TEENS 5 - Amount (size/oz): UNKNOWN 5 - Frequency: UNKNOWN 5 - Duration: UNKNOWN 5 - Last Use / Amount: STOPPED IN NOV OR DEC 2017          Sleep: restless  Appetite:  Fair  Current Medications: Current Facility-Administered Medications  Medication Dose Route Frequency  Provider Last Rate Last Dose  . acetaminophen (TYLENOL) tablet 650 mg  650 mg Oral Q6H PRN Truman Hayward, FNP   650 mg at 10/28/16 1959  . alum & mag hydroxide-simeth (MAALOX/MYLANTA) 200-200-20 MG/5ML suspension 30 mL  30 mL Oral Q4H PRN Truman Hayward, FNP      . ARIPiprazole (ABILIFY) tablet 20 mg  20 mg Oral Daily Izediuno, Delight Ovens, MD   20 mg at 10/30/16 0804  . diphenhydrAMINE (BENADRYL) capsule 50 mg  50 mg Oral Q6H PRN Truman Hayward, FNP   50 mg at 10/28/16 0916  . haloperidol (HALDOL) tablet 5 mg  5 mg Oral Q6H PRN Georgiann Cocker, MD   5 mg at 10/28/16 1004   Or  .  haloperidol lactate (HALDOL) injection 5 mg  5 mg Intramuscular Q6H PRN Izediuno, Delight Ovens, MD      . hydrOXYzine (ATARAX/VISTARIL) tablet 25 mg  25 mg Oral Q4H PRN Jomarie Longs, MD   25 mg at 10/30/16 0256  . lithium carbonate capsule 900 mg  900 mg Oral QHS Georgiann Cocker, MD   900 mg at 10/29/16 2153  . magnesium hydroxide (MILK OF MAGNESIA) suspension 30 mL  30 mL Oral Daily PRN Darcella Gasman, Takia S, FNP      . nicotine polacrilex (NICORETTE) gum 2 mg  2 mg Oral PRN Jomarie Longs, MD   2 mg at 10/29/16 2002  . traZODone (DESYREL) tablet 150 mg  150 mg Oral QHS Angela Vazguez, MD        Lab Results: No results found for this or any previous visit (from the past 48 hour(s)).  Blood Alcohol level:  Lab Results  Component Value Date   ETH <5 10/23/2016   ETH <5 12/01/2015    Metabolic Disorder Labs: Lab Results  Component Value Date   HGBA1C 4.8 10/25/2016   MPG 91 10/25/2016   MPG 91 12/03/2015   Lab Results  Component Value Date   PROLACTIN 33.3 (H) 10/25/2016   PROLACTIN 66.9 (H) 12/03/2015   Lab Results  Component Value Date   CHOL 123 10/25/2016   TRIG 115 10/25/2016   HDL 39 (L) 10/25/2016   CHOLHDL 3.2 10/25/2016   VLDL 23 10/25/2016   LDLCALC 61 10/25/2016   LDLCALC 50 12/03/2015    Physical Findings: AIMS: Facial and Oral Movements Muscles of Facial Expression: None, normal Lips and Perioral Area: None, normal Jaw: None, normal Tongue: None, normal,Extremity Movements Upper (arms, wrists, hands, fingers): None, normal Lower (legs, knees, ankles, toes): None, normal, Trunk Movements Neck, shoulders, hips: None, normal, Overall Severity Severity of abnormal movements (highest score from questions above): None, normal Incapacitation due to abnormal movements: None, normal Patient's awareness of abnormal movements (rate only patient's report): No Awareness, Dental Status Current problems with teeth and/or dentures?: No Does patient usually wear  dentures?: No  CIWA:    COWS:     Musculoskeletal: Strength & Muscle Tone: within normal limits Gait & Station: normal Patient leans: [name]  Psychiatric Specialty Exam: Physical Exam  Nursing note and vitals reviewed.   Review of Systems  Psychiatric/Behavioral: The patient is nervous/anxious and has insomnia.   All other systems reviewed and are negative.   Blood pressure (!) 115/59, pulse 79, temperature 98.6 F (37 C), temperature source Oral, resp. rate 20, height 6\' 1"  (1.854 m), weight 89.1 kg (196 lb 8 oz), last menstrual period 10/05/2016, SpO2 99 %.Body mass index is 25.93 kg/m.  General Appearance: Casual  Eye Contact:  Fair  Speech:  Normal Rate  Volume:  Normal  Mood:  Anxious and Euphoric  Affect:  Congruent  Thought Process:  Goal Directed and Descriptions of Associations: Circumstantial  Orientation:  Full (Time, Place, and Person)  Thought Content:  Paranoid Ideation and Rumination  Suicidal Thoughts:  No  Homicidal Thoughts:  No  Memory:  Immediate;   Fair Recent;   Fair Remote;   Fair  Judgement:  Impaired  Insight:  Shallow  Psychomotor Activity:  Restlessness  Concentration:  Concentration: Fair and Attention Span: Fair  Recall:  FiservFair  Fund of Knowledge:  Fair  Language:  Fair  Akathisia:  No  Handed:  Right  AIMS (if indicated):     Assets:  Communication Skills Desire for Improvement  ADL's:  Intact  Cognition:  WNL  Sleep:  Number of Hours: 3.75     Treatment Plan Summary:Patient presented labile and tangential , hx of bipolar do , also has hx of polysubstance abuse.will continue treatment. Daily contact with patient to assess and evaluate symptoms and progress in treatment, Medication management and Plan see below  Will continue Abilify 20 mg po daily for mood sx. Will continue Li 900 mg po qhs for mood sx. Li level on 10/31/16. Increase Trazodone to 150 mg po qhs for insomnia. CSW will continue to work on disposition.  Geni Skorupski,  MD 10/30/2016, 2:53 PM

## 2016-10-31 MED ORDER — ARIPIPRAZOLE ER 400 MG IM SRER
400.0000 mg | INTRAMUSCULAR | Status: DC
  Administered 2016-10-31: 400 mg via INTRAMUSCULAR
  Filled 2016-10-31: qty 400

## 2016-10-31 NOTE — Progress Notes (Signed)
Recreation Therapy Notes  Animal-Assisted Activity (AAA) Program Checklist/Progress Notes Patient Eligibility Criteria Checklist & Daily Group note for Rec Tx Intervention  Date: 10/31/2016 Time: 2:55pm [address]    AAA/T Program Assumption of Risk Form signed by Patient/ or Parent Legal Guardian Yes  Patient is free of allergies or sever asthma Yes  Patient reports no fear of animals Yes  Patient reports no history of cruelty to animals Yes  Patient understands his/her participation is voluntary Yes  Patient washes hands before animal contact Yes  Patient washes hands after animal contact Yes  Behavioral Response: engaged   Education: Charity fundraiserHand Washing, Appropriate Animal Interaction   Education Outcome: Acknowledges education.   Clinical Observations/Feedback: Patient discussed with MD for appropriateness in pet therapy session. Both LRT and MD agree patient is appropriate for participation. Patient offered participation in session and signed necessary consent form without issue. Patient appropriately participated in activity with therapy dog and peers.  Marvell Fullerachel Faatimah Spielberg, Recreation Therapy Intern

## 2016-10-31 NOTE — Progress Notes (Signed)
DAR NOTE: Patient presents with anxious affect and depressed mood.  Denies pain, auditory and visual hallucinations.  Described energy level as hyper and concentration as good.  Rates depression at 0, hopelessness at 0, and anxiety at 0.  Maintained on routine safety checks.  Medications given as prescribed.  Support and encouragement offered as needed.  Attended group and participated.  States goal for today is "get home."  Patient observed socializing with peers in the dayroom.  Abilify SRER IM given.  No adverse reaction noted.  Offered no complaint.

## 2016-10-31 NOTE — [facility] Group Notes (Signed)
[facility] LCSW Group Therapy  10/31/2016 2:46 PM   Type of Therapy:  Group Therapy  Participation Level:  Active  Participation Quality:  Attentive  Affect:  Appropriate  Cognitive:  Appropriate  Insight:  Improving  Engagement in Therapy:  Engaged  Modes of Intervention:  Clarification, Education, Exploration and Socialization  Summary of Progress/Problems: Today's group focused on resilience.  Stayed the entire time, engaged throughout.  No outbursts, no tears. Calm. Talked abut knowing that she makes poor decisions about friends, or people with whom she decides to hang out.  They belittle her and make her feel bad about herself.  "I need to distance myself from negative people, and make better decisions."  Amy [name], Amy [name] B 10/31/2016 , 2:46 PM

## 2016-10-31 NOTE — Progress Notes (Signed)
[facility] MD Progress Note  10/31/2016 1:59 PM Amy [name]  MRN:  [id number] Subjective: Pt states " I am ok. My mom thinks I should not be on Lithium."   Objective;Patient seen and chart reviewed.Discussed patient with treatment team.  Pt today seen as less anxious , more preoccupied with discharge. Pt wanted treatment team to contact mother - CSW was hence able to call her. Discussed plan with mother. Discussed the need for Li level tomorrow AM. Pt agreed to an LAI - will offer the same . Continue to treat.    Principal Problem: Bipolar disorder, curr episode mixed, severe, with psychotic features (HCC) Diagnosis:   Patient Active Problem List   Diagnosis Date Noted  . Bipolar disorder, curr episode mixed, severe, with psychotic features (HCC) [F31.64] 10/25/2016  . Cocaine use disorder, mild, abuse [F14.10] 10/25/2016  . Cannabis use disorder, severe, dependence (HCC) [F12.20] 10/25/2016  . Insomnia [G47.00]   . Anxiety state [F41.1]   . Cocaine abuse [F14.10] 07/04/2015  . Cannabis abuse, continuous [F12.10] 07/02/2015  . Non compliance with medical treatment [Z91.19] 06/18/2015   Total Time spent with patient: 25 minutes  Past Psychiatric History: Please see H&P.   Past Medical History:  Past Medical History:  Diagnosis Date  . Depression    History reviewed. No pertinent surgical history. Family History:  Family History  Problem Relation Age of Onset  . Mental illness Neg Hx    Family Psychiatric  History: Please see H&P.  Social History:  History  Alcohol Use No     History  Drug Use  . Types: Marijuana    Comment: denies    Social History   Social History  . Marital status: Single    Spouse name: [name]  . Number of children: [name]  . Years of education: [name]   Social History Main Topics  . Smoking status: Current Every Day Smoker    Types: Cigarettes  . Smokeless tobacco: Never Used  . Alcohol use No  . Drug use: Yes    Types: Marijuana   Comment: denies  . Sexual activity: Not Asked   Other Topics Concern  . None   Social History Narrative  . None   Additional Social History:    Pain Medications: No abuse reported Prescriptions: SEE MAR Over the Counter: No abuse reported History of alcohol / drug use?: Yes Negative Consequences of Use: Financial, Personal relationships Withdrawal Symptoms:  (none) Name of Substance 1: CANNABIS 1 - Age of First Use: TEENS 1 - Amount (size/oz): UNKNOWN 1 - Frequency: DAILY 1 - Duration: ONGOING 1 - Last Use / Amount: 10/23/16 Name of Substance 2: COCAINE 2 - Age of First Use: TEENS 2 - Amount (size/oz): UNKNOWN 2 - Frequency: 2 TIMES SINCE JANUARY 2018 2 - Duration: ONGOING 2 - Last Use / Amount: LAST WEEK Name of Substance 3: ALCOHOL 3 - Age of First Use: TEENS 3 - Amount (size/oz): 1-2 BEERS 3 - Frequency: "A COUPLE OF TIMES A WEEK WITH MY FRIEND" 3 - Duration: ONGOING 3 - Last Use / Amount: "A FEW DAYS AGO" Name of Substance 4: NICOTINE/CIGARETTES 4 - Age of First Use: TEENS 4 - Amount (size/oz): 1/2 PACK 4 - Frequency: DAILY 4 - Duration: ONGOING 4 - Last Use / Amount: 10/23/16 Name of Substance 5: AMPHETAMINES-ADDERALL 5 - Age of First Use: TEENS 5 - Amount (size/oz): UNKNOWN 5 - Frequency: UNKNOWN 5 - Duration: UNKNOWN 5 - Last Use / Amount: STOPPED IN NOV OR  DEC 2017          Sleep: Fair  Appetite:  Fair  Current Medications: Current Facility-Administered Medications  Medication Dose Route Frequency Provider Last Rate Last Dose  . acetaminophen (TYLENOL) tablet 650 mg  650 mg Oral Q6H PRN Truman HaywardStarkes, Takia S, FNP   650 mg at 10/30/16 2112  . alum & mag hydroxide-simeth (MAALOX/MYLANTA) 200-200-20 MG/5ML suspension 30 mL  30 mL Oral Q4H PRN Truman HaywardStarkes, Takia S, FNP      . ARIPiprazole (ABILIFY) tablet 20 mg  20 mg Oral Daily Izediuno, Delight OvensVincent A, MD   20 mg at 10/31/16 0837  . ARIPiprazole ER SRER 400 mg  400 mg Intramuscular Q28 days Jomarie LongsEappen, Kelita Wallis, MD   400  mg at 10/31/16 1049  . diphenhydrAMINE (BENADRYL) capsule 50 mg  50 mg Oral Q6H PRN Truman HaywardStarkes, Takia S, FNP   50 mg at 10/30/16 2112  . haloperidol (HALDOL) tablet 5 mg  5 mg Oral Q6H PRN Georgiann CockerIzediuno, Vincent A, MD   5 mg at 10/30/16 2112   Or  . haloperidol lactate (HALDOL) injection 5 mg  5 mg Intramuscular Q6H PRN Izediuno, Delight OvensVincent A, MD      . hydrOXYzine (ATARAX/VISTARIL) tablet 25 mg  25 mg Oral Q4H PRN Jomarie LongsEappen, Christo Hain, MD   25 mg at 10/30/16 2112  . lithium carbonate capsule 900 mg  900 mg Oral QHS Georgiann CockerIzediuno, Vincent A, MD   900 mg at 10/30/16 2111  . magnesium hydroxide (MILK OF MAGNESIA) suspension 30 mL  30 mL Oral Daily PRN Darcella GasmanStarkes, Takia S, FNP      . nicotine polacrilex (NICORETTE) gum 2 mg  2 mg Oral PRN Jomarie LongsEappen, Brycin Kille, MD   2 mg at 10/29/16 2002  . traZODone (DESYREL) tablet 150 mg  150 mg Oral QHS Jomarie LongsEappen, Armstead Heiland, MD   150 mg at 10/30/16 2112    Lab Results: No results found for this or any previous visit (from the past 48 hour(s)).  Blood Alcohol level:  Lab Results  Component Value Date   ETH <5 10/23/2016   ETH <5 12/01/2015    Metabolic Disorder Labs: Lab Results  Component Value Date   HGBA1C 4.8 10/25/2016   MPG 91 10/25/2016   MPG 91 12/03/2015   Lab Results  Component Value Date   PROLACTIN 33.3 (H) 10/25/2016   PROLACTIN 66.9 (H) 12/03/2015   Lab Results  Component Value Date   CHOL 123 10/25/2016   TRIG 115 10/25/2016   HDL 39 (L) 10/25/2016   CHOLHDL 3.2 10/25/2016   VLDL 23 10/25/2016   LDLCALC 61 10/25/2016   LDLCALC 50 12/03/2015    Physical Findings: AIMS: Facial and Oral Movements Muscles of Facial Expression: None, normal Lips and Perioral Area: None, normal Jaw: None, normal Tongue: None, normal,Extremity Movements Upper (arms, wrists, hands, fingers): None, normal Lower (legs, knees, ankles, toes): None, normal, Trunk Movements Neck, shoulders, hips: None, normal, Overall Severity Severity of abnormal movements (highest score from  questions above): None, normal Incapacitation due to abnormal movements: None, normal Patient's awareness of abnormal movements (rate only patient's report): No Awareness, Dental Status Current problems with teeth and/or dentures?: No Does patient usually wear dentures?: No  CIWA:    COWS:     Musculoskeletal: Strength & Muscle Tone: within normal limits Gait & Station: normal Patient leans: [name]  Psychiatric Specialty Exam: Physical Exam  Nursing note and vitals reviewed.   Review of Systems  Psychiatric/Behavioral: The patient is nervous/anxious.   All other systems reviewed  and are negative.   Blood pressure (!) 107/56, pulse 79, temperature 97.5 F (36.4 C), temperature source Oral, resp. rate 18, height 6\' 1"  (1.854 m), weight 89.1 kg (196 lb 8 oz), last menstrual period 10/05/2016, SpO2 99 %.Body mass index is 25.93 kg/m.  General Appearance: Casual  Eye Contact:  Fair  Speech:  Normal Rate  Volume:  Normal  Mood:  Anxious and Euphoric, improving  Affect:  Congruent  Thought Process:  Goal Directed and Descriptions of Associations: Circumstantial  Orientation:  Full (Time, Place, and Person)  Thought Content:  Rumination  Suicidal Thoughts:  No  Homicidal Thoughts:  No  Memory:  Immediate;   Fair Recent;   Fair Remote;   Fair  Judgement:  Other:  improving  Insight:  Fair  Psychomotor Activity:  Restlessness  Concentration:  Concentration: Fair and Attention Span: Fair  Recall:  [employer] of Knowledge:  Fair  Language:  Fair  Akathisia:  No  Handed:  Right  AIMS (if indicated):     Assets:  Communication Skills Desire for Improvement  ADL's:  Intact  Cognition:  WNL  Sleep:  Number of Hours: 6.75    Will continue today 10/31/16 plan as below except where it is noted.  Treatment Plan Summary:Patient today seen as less labile , continues to need treatment and support, agreed to an LAI.  Daily contact with patient to assess and evaluate symptoms and  progress in treatment, Medication management and Plan see below  Will continue Abilify 20 mg po daily for mood sx. Will give Abilify Maintena IM 400 mg LAI q30 days - first dose 10/31/16. Will continue Li 900 mg po qhs for mood sx. Li level on 11/01/16. Increase Trazodone to 150 mg po qhs for insomnia. CSW will continue to work on disposition.  Marylene Masek, MD 10/31/2016, 1:59 PM

## 2016-10-31 NOTE — Progress Notes (Signed)
Recreation Therapy Notes  Date: 10/31/16 Time: 1000 Location: 500 Hall Dayroom  Group Topic: Communication  Goal Area(s) Addresses:  Patient will effectively communicate with peers in group.  Patient will verbalize benefit of healthy communication. Patient will verbalize positive effect of healthy communication on post d/c goals.  Patient will identify communication techniques that made activity effective for group.   Behavioral Response: Engaged  Intervention:  Blank paper, pencils, drawings     Activity: Back-to-Back Drawing.  Patients were divided into teams of two.  One person was designated the speaker and the other the listener.  The speaker was given a picture in which they had to give instructions to the listener in a way in which they would be able to draw the picture on a separate piece of paper.  Patients would then switch rolls and change pictures.  Education: Communication, Discharge Planning  Education Outcome: Acknowledges understanding/In group clarification offered/Needs additional education.   Clinical Observations/Feedback: Pt stated she gave her instructions from the top of the page to the bottom.  Pt stated she was better at taking instructions because she doesn't always get her words right.  Pt also stated she was successful at drawing the picture because "I just wrote what he said and asked questions if needed".  Pt expressed in real life conversations she likes to address things right when they happen instead of letting them build up but agreed there was a way to do that so her point wouldn't get lost in the delivery of the message.   Caroll RancherMarjette Donato Studley, LRT/CTRS         Caroll RancherLindsay, Baleria Wyman A 10/31/2016 12:19 PM

## 2016-11-01 LAB — LITHIUM LEVEL: Lithium Lvl: 0.63 mmol/L (ref 0.60–1.20)

## 2016-11-01 MED ORDER — HYDROXYZINE HCL 25 MG PO TABS: 25.0000 mg | ORAL_TABLET | ORAL | 0 refills | Status: DC | PRN

## 2016-11-01 MED ORDER — LITHIUM CARBONATE 300 MG PO CAPS: 900.0000 mg | ORAL_CAPSULE | Freq: Every day | ORAL | 0 refills | Status: DC

## 2016-11-01 MED ORDER — TRAZODONE HCL 150 MG PO TABS: 150.0000 mg | ORAL_TABLET | Freq: Every day | ORAL | 0 refills | Status: DC

## 2016-11-01 MED ORDER — ARIPIPRAZOLE 10 MG PO TABS
20.0000 mg | ORAL_TABLET | Freq: Every day | ORAL | Status: DC
  Filled 2016-11-01 (×2): qty 2

## 2016-11-01 MED ORDER — ARIPIPRAZOLE 20 MG PO TABS: 20.0000 mg | ORAL_TABLET | Freq: Every day | ORAL | 0 refills | Status: DC

## 2016-11-01 MED ORDER — NICOTINE POLACRILEX 2 MG MT GUM: 2.0000 mg | CHEWING_GUM | OROMUCOSAL | 0 refills | Status: DC | PRN

## 2016-11-01 MED ORDER — ARIPIPRAZOLE ER 400 MG IM SRER: 400.0000 mg | INTRAMUSCULAR | 0 refills | Status: DC

## 2016-11-01 NOTE — Progress Notes (Signed)
Recreation Therapy Notes  Date: 11/01/16 Time: 1000 Location: 500 Hall Dayroom  Group Topic: Leisure Education  Goal Area(s) Addresses:  Patient will identify positive leisure activities.  Patient will identify one positive benefit of participation in leisure activities.   Behavioral Response: Engaged  Intervention: Magazines, scissors, glue sticks, Holiday representativeconstruction paper  Activity: Leisure Science writerBrochure.  Patients were to create their own recreation center and develop programs that would be offered at their centers.  Each recreation center came equipped with a building, ball field and pool.  Using the resources provided, patients were to come up with activities that can be offered at each place.  Education:  Leisure Education, Building control surveyorDischarge Planning  Education Outcome: Acknowledges education/In group clarification offered/Needs additional education  Clinical Observations/Feedback: Pt [facility] recreation [facility].  Pt stated her facility would be on [place] and have a futuristic ballfield and offer grilling stations, food, games and a spa.  Pt stated leisure could be used as a Associate Professorcoping skill by "using internal motivation and not just soaking up energy from others".   Caroll RancherMarjette Gunnar Hereford, LRT/CTRS         Caroll RancherLindsay, Sumaya Riedesel A 11/01/2016 12:52 PM

## 2016-11-01 NOTE — Progress Notes (Signed)
Patient ID: Amy NajjarAdantre C Thaxton, female   DOB: 01-21-1996, 21 y.o.   MRN: [id number]  D: Patient in room on approach. Pt mood and affect appeared anxious. Pt reports  tolerating medication well. Pt denies SI/HI/AVH and pain. 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 Pt attended and participated in evening wrap up group. Cooperative with assessment.  A: Medications administered as prescribed. Support and encouragement provided as needed. Pt encouraged to discuss feelings and come to staff with any question or concerns.  R: Patient remains safe and complaint with medications.
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
## 2016-11-01 NOTE — Tx Team (Signed)
Interdisciplinary Treatment and Diagnostic Plan Update  11/01/2016 Time of Session: 8:39 AM  KINDRED HEYING MRN: [id number]  Principal Diagnosis: Bipolar disorder, curr episode mixed, severe, with psychotic features ([place])  Secondary Diagnoses: Principal Problem:   Bipolar disorder, curr episode mixed, severe, with psychotic features ([place]) Active Problems:   Cocaine use disorder, mild, abuse   Cannabis use disorder, severe, dependence ([place])   Current Medications:  Current Facility-Administered Medications  Medication Dose Route Frequency Provider Last Rate Last Dose  . acetaminophen (TYLENOL) tablet 650 mg  650 mg Oral Q6H PRN Nanci Pina, FNP   650 mg at 10/30/16 2112  . alum & mag hydroxide-simeth (MAALOX/MYLANTA) 200-200-20 MG/5ML suspension 30 mL  30 mL Oral Q4H PRN Nanci Pina, FNP      . ARIPiprazole (ABILIFY) tablet 20 mg  20 mg Oral Daily Izediuno, Laruth Bouchard, MD   20 mg at 11/01/16 0802  . ARIPiprazole ER SRER 400 mg  400 mg Intramuscular Q28 days Ursula Alert, MD   400 mg at 10/31/16 1049  . diphenhydrAMINE (BENADRYL) capsule 50 mg  50 mg Oral Q6H PRN Nanci Pina, FNP   50 mg at 10/30/16 2112  . haloperidol (HALDOL) tablet 5 mg  5 mg Oral Q6H PRN Artist Beach, MD   5 mg at 10/31/16 2234   Or  . haloperidol lactate (HALDOL) injection 5 mg  5 mg Intramuscular Q6H PRN Izediuno, Laruth Bouchard, MD      . hydrOXYzine (ATARAX/VISTARIL) tablet 25 mg  25 mg Oral Q4H PRN Ursula Alert, MD   25 mg at 10/30/16 2112  . lithium carbonate capsule 900 mg  900 mg Oral QHS Artist Beach, MD   900 mg at 10/31/16 2234  . magnesium hydroxide (MILK OF MAGNESIA) suspension 30 mL  30 mL Oral Daily PRN Lavina Hamman, Takia S, FNP      . nicotine polacrilex (NICORETTE) gum 2 mg  2 mg Oral PRN Ursula Alert, MD   2 mg at 10/29/16 2002  . traZODone (DESYREL) tablet 150 mg  150 mg Oral QHS Ursula Alert, MD   150 mg at 10/31/16 2234    PTA Medications: No prescriptions prior  to admission.    Treatment Modalities: Medication Management, Group therapy, Case management,  1 to 1 session with clinician, Psychoeducation, Recreational therapy.   Physician Treatment Plan for Primary Diagnosis: Bipolar disorder, curr episode mixed, severe, with psychotic features ([place]) Long Term Goal(s): Improvement in symptoms so as ready for discharge  Short Term Goals: Ability to verbalize feelings will improve Compliance with prescribed medications will improve Ability to identify triggers associated with substance abuse/mental health issues will improve Ability to verbalize feelings will improve Compliance with prescribed medications will improve Ability to identify triggers associated with substance abuse/mental health issues will improve  Medication Management: Evaluate patient's response, side effects, and tolerance of medication regimen.  Therapeutic Interventions: 1 to 1 sessions, Unit Group sessions and Medication administration.  Evaluation of Outcomes: Adequate for Discharge  Physician Treatment Plan for Secondary Diagnosis: Principal Problem:   Bipolar disorder, curr episode mixed, severe, with psychotic features ([place]) Active Problems:   Cocaine use disorder, mild, abuse   Cannabis use disorder, severe, dependence ([place])   Long Term Goal(s): Improvement in symptoms so as ready for discharge  Short Term Goals: Ability to verbalize feelings will improve Compliance with prescribed medications will improve Ability to identify triggers associated with substance abuse/mental health issues will improve Ability to verbalize feelings will improve Compliance  with prescribed medications will improve Ability to identify triggers associated with substance abuse/mental health issues will improve  Medication Management: Evaluate patient's response, side effects, and tolerance of medication regimen.  Therapeutic Interventions: 1 to 1 sessions, Unit Group sessions and  Medication administration.  Evaluation of Outcomes: Adequate for Discharge   RN Treatment Plan for Primary Diagnosis: Bipolar disorder, curr episode mixed, severe, with psychotic features ([place]) Long Term Goal(s): Knowledge of disease and therapeutic regimen to maintain health will improve  Short Term Goals: Ability to identify and develop effective coping behaviors will improve and Compliance with prescribed medications will improve  Medication Management: RN will administer medications as ordered by provider, will assess and evaluate patient's response and provide education to patient for prescribed medication. RN will report any adverse and/or side effects to prescribing provider.  Therapeutic Interventions: 1 on 1 counseling sessions, Psychoeducation, Medication administration, Evaluate responses to treatment, Monitor vital signs and CBGs as ordered, Perform/monitor CIWA, COWS, AIMS and Fall Risk screenings as ordered, Perform wound care treatments as ordered.  Evaluation of Outcomes: Adequate for Discharge   LCSW Treatment Plan for Primary Diagnosis: Bipolar disorder, curr episode mixed, severe, with psychotic features ([place]) Long Term Goal(s): Safe transition to appropriate next level of care at discharge, Engage patient in therapeutic group addressing interpersonal concerns.  Short Term Goals: Engage patient in aftercare planning with referrals and resources  Therapeutic Interventions: Assess for all discharge needs, 1 to 1 time with Social worker, Explore available resources and support systems, Assess for adequacy in community support network, Educate family and significant other(s) on suicide prevention, Complete Psychosocial Assessment, Interpersonal group therapy.  Evaluation of Outcomes: Met  Return home, follow up outpt   Progress in Treatment: Attending groups: Yes Participating in groups: Yes Taking medication as prescribed: Yes Toleration medication: Yes, no side effects  reported at this time Family/Significant other contact made: No Patient understands diagnosis: No limited insight Discussing patient identified problems/goals with staff: Yes Medical problems stabilized or resolved: Yes Denies suicidal/homicidal ideation: Yes Issues/concerns per patient self-inventory: None Other: [name]  New problem(s) identified: None identified at this time.   New Short Term/Long Term Goal(s): None identified at this time.   Discharge Plan or Barriers:   Reason for Continuation of Hospitalization   Estimated Length of Stay: D/C today  Attendees: Patient: 11/01/2016  8:39 AM  Physician: Ursula Alert, MD 11/01/2016  8:39 AM  Nursing: Elesa Massed, RN 11/01/2016  8:39 AM  RN Care Manager: Lars Pinks, RN 11/01/2016  8:39 AM  Social Worker: Ripley Fraise 11/01/2016  8:39 AM  Recreational Therapist: Laretta Bolster  11/01/2016  8:39 AM  Other: Norberto Sorenson 11/01/2016  8:39 AM  Other:  11/01/2016  8:39 AM    Scribe for Treatment Team:  Roque Lias LCSW 11/01/2016 8:39 AM

## 2016-11-01 NOTE — Progress Notes (Signed)
Discharge Note:  Patient discharged home with friend.  Patient denied SI and HI.  Denied A/V hallucinations.  Suicide prevention information given and discussed with patient who stated she understood and had no questions.  Patient stated she received all her belongings, clothing, toiletries, misc items, pocketbook, purse, phone, prescriptions, medications, etc.  Patient stated she [facility].  All required discharge information given to patient at discharge.

## 2016-11-01 NOTE — Progress Notes (Signed)
D:  Patient denied SI & HI, contracts for safety.  Denied A/V hallucinations.   A:  Medications administered per MD orders.  Emotional support and encouragement given patient. R:  Safety maintained with 15 minute checks.

## 2016-11-01 NOTE — [facility] Suicide Risk Assessment (Signed)
[facility]   Principal Problem: Bipolar disorder, curr episode mixed, severe, with psychotic features [facility](HCC) Discharge Diagnoses:  Patient Active Problem List   Diagnosis Date Noted  . Bipolar disorder, curr episode mixed, severe, with psychotic features (HCC) [F31.64] 10/25/2016  . Cocaine use disorder, mild, abuse [F14.10] 10/25/2016  . Cannabis use disorder, severe, dependence (HCC) [F12.20] 10/25/2016  . Insomnia [G47.00]   . Anxiety state [F41.1]   . Cocaine abuse [F14.10] 07/04/2015  . Cannabis abuse, continuous [F12.10] 07/02/2015  . Non compliance with medical treatment [Z91.19] 06/18/2015    Total Time spent with patient: 30 minutes  Musculoskeletal: Strength & Muscle Tone: within normal limits Gait & Station: normal Patient leans: [name]  Psychiatric Specialty Exam: Review of Systems  Psychiatric/Behavioral: Negative for depression, hallucinations and suicidal ideas. The patient is not nervous/anxious.   All other systems reviewed and are negative.   Blood pressure 119/66, pulse 92, temperature 98.2 F (36.8 C), temperature source Oral, resp. rate 18, height 6\' 1"  (1.854 m), weight 89.1 kg (196 lb 8 oz), last menstrual period 10/05/2016, SpO2 99 %.Body mass index is 25.93 kg/m.  General Appearance: Casual  Eye Contact::  Fair  Speech:  Normal Rate409  Volume:  Normal  Mood:  Euthymic  Affect:  Appropriate  Thought Process:  Goal Directed and Descriptions of Associations: Intact  Orientation:  Full (Time, Place, and Person)  Thought Content:  Logical  Suicidal Thoughts:  No  Homicidal Thoughts:  No  Memory:  Immediate;   Fair Recent;   Fair Remote;   Fair  Judgement:  Fair  Insight:  Fair  Psychomotor Activity:  Normal  Concentration:  Fair  Recall:  FiservFair  Fund of Knowledge:Fair  Language: Fair  Akathisia:  No  Handed:  Right  AIMS (if indicated):     Assets:  Communication Skills Desire for Improvement  Sleep:  Number of  Hours: 6.75  Cognition: WNL  ADL's:  Intact   Mental Status Per Nursing Assessment::   On Admission:  NA  Demographic Factors:  Adolescent or young adult  Loss Factors: NA  Historical Factors: Impulsivity  Risk Reduction Factors:   Positive social support and Positive therapeutic relationship  Continued Clinical Symptoms:  Alcohol/Substance Abuse/Dependencies Previous Psychiatric Diagnoses and Treatments  Cognitive Features That Contribute To Risk:  None    Suicide Risk:  Minimal: No identifiable suicidal ideation.  Patients presenting with no risk factors but with morbid ruminations; may be classified as minimal risk based on the severity of the depressive symptoms  Follow-up Information    [facility] Follow up on 11/03/2016.   Why:  Friday at 2:30 with Nyra JabsFrancis Gill. Contact information: [address] [place] [address] [phone]        [facility], [facility] Follow up on 11/15/2016.   Why:  Mr Lars MageJuan and Dr Deatra JamesBarahni will see you the last Wednesday of the month so you will not have to go to the clinic anymore Contact information: [address] [place] [address] [phone]           Plan Of Care/Follow-up recommendations:  Activity:  no restrictions Diet:  regular Tests:  as needed Other:  Abilify Maintena IM 400 mg IM - first dose 10/31/16, repeat q28 days  Jlynn Langille, MD 11/01/2016, 10:12 AM

## 2016-11-01 NOTE — Plan of Care (Signed)
Problem: Education: Goal: Will be free of psychotic symptoms Outcome: Progressing Nurse discussed A/V hallucinations with patient, 1:1 present.

## 2016-11-01 NOTE — Progress Notes (Signed)
  [facility] :  Will you be returning to the same living situation after discharge:  Yes,  home At discharge, do you have transportation home?: Yes,  family Do you have the ability to pay for your medications: Yes,  mental health  Release of information consent forms completed and in the chart;  Patient's signature needed at discharge.  Patient to Follow up at: Follow-up Information    [facility] Follow up on 11/03/2016.   Why:  Friday at 2:30 with Nyra JabsFrancis Gill. Contact information: [address] [place] [address] [phone]        [facility] Follow up on 11/15/2016.   Why:  Mr Lars MageJuan and Dr Deatra JamesBarahni will see you the last Wednesday of the month so you will not have to go to the clinic anymore Contact information: [address] [place] [address] [phone]           Next level of care provider [facility] Link:no  Safety Planning and Suicide Prevention discussed: Yes,  yes  Have you used any form of tobacco in the last 30 days? (Cigarettes, Smokeless Tobacco, Cigars, and/or Pipes): Yes  Has patient been referred to the Quitline?: Patient refused referral  Patient has been referred for addiction treatment: Yes  Amy [name] 11/01/2016, 9:16 AM

## 2016-11-01 NOTE — Plan of Care (Signed)
Problem: [facility] Participation in Recreation Therapeutic Interventions Goal: STG-Patient will demonstrate improved communication skills b STG: Communication - Patient will improve communication skills, as demonstrated by ability to actively participate in at least 2 processing discussion during recreation therapy group sessions by conclusion of recreation therapy tx  Outcome: Completed/Met Date Met: 11/01/16 Pt was able to show improved communication skills by participating in processing for leisure education, coping skills and communication recreation therapy sessions.  Victorino Sparrow, LRT/CTRS

## 2016-12-12 ENCOUNTER — Encounter (HOSPITAL_COMMUNITY): Payer: Self-pay | Admitting: Emergency Medicine

## 2016-12-12 ENCOUNTER — Emergency Department (HOSPITAL_COMMUNITY)
Admission: EM | Admit: 2016-12-12 | Discharge: 2016-12-12 | Disposition: A | Discharge status: Home / Self Care | Attending: Emergency Medicine | Admitting: Emergency Medicine

## 2016-12-12 DIAGNOSIS — S71132D Puncture wound without foreign body, left thigh, subsequent encounter: Secondary | ICD-10-CM | POA: Insufficient documentation

## 2016-12-12 DIAGNOSIS — Z5189 Encounter for other specified aftercare: Secondary | ICD-10-CM

## 2016-12-12 DIAGNOSIS — Y929 Unspecified place or not applicable: Secondary | ICD-10-CM | POA: Insufficient documentation

## 2016-12-12 DIAGNOSIS — Z79899 Other long term (current) drug therapy: Secondary | ICD-10-CM | POA: Insufficient documentation

## 2016-12-12 DIAGNOSIS — Y939 Activity, unspecified: Secondary | ICD-10-CM | POA: Insufficient documentation

## 2016-12-12 DIAGNOSIS — W540XXD Bitten by dog, subsequent encounter: Secondary | ICD-10-CM | POA: Insufficient documentation

## 2016-12-12 DIAGNOSIS — Y999 Unspecified external cause status: Secondary | ICD-10-CM | POA: Insufficient documentation

## 2016-12-12 DIAGNOSIS — F17210 Nicotine dependence, cigarettes, uncomplicated: Secondary | ICD-10-CM | POA: Insufficient documentation

## 2016-12-12 MED ORDER — CLINDAMYCIN HCL 150 MG PO CAPS
150.0000 mg | ORAL_CAPSULE | Freq: Once | ORAL | Status: DC
  Filled 2016-12-12: qty 1

## 2016-12-12 MED ORDER — DOXYCYCLINE HYCLATE 100 MG PO TABS
100.0000 mg | ORAL_TABLET | Freq: Once | ORAL | Status: AC
  Administered 2016-12-12: 100 mg via ORAL
  Filled 2016-12-12: qty 1

## 2016-12-12 NOTE — ED Notes (Signed)
Pt's mother can be contacted at [phone]. Pt's mother reports that pt has been off her medications

## 2016-12-12 NOTE — ED Provider Notes (Signed)
[facility]-EMERGENCY DEPT Provider Note   CSN: [id number] Arrival date & time: 12/12/16  1710     History   Chief Complaint Chief Complaint  Patient presents with  . Animal Bite  . Wound Check    HPI Amy [name] is a 21 y.o. female.  HPI  Patient presents to ED for evaluation of dog bite that occurred 3 days ago. She states that she was put on 2 antibiotics but is unable to pick them up due to the price. She denies any drainage, bleeding, fevers, nausea, vomiting. She denies any subsequent injuries or bites.  Past Medical History:  Diagnosis Date  . Depression     Patient Active Problem List   Diagnosis Date Noted  . Bipolar disorder, curr episode mixed, severe, with psychotic features (HCC) 10/25/2016  . Cocaine use disorder, mild, abuse 10/25/2016  . Cannabis use disorder, severe, dependence (HCC) 10/25/2016  . Insomnia   . Anxiety state   . Cocaine abuse 07/04/2015  . Cannabis abuse, continuous 07/02/2015  . Non compliance with medical treatment 06/18/2015    History reviewed. No pertinent surgical history.  OB History    No data available       Home Medications    Prior to Admission medications   Medication Sig Start Date End Date Taking? Authorizing Provider  ARIPiprazole (ABILIFY) 20 MG tablet Take 1 tablet (20 mg total) by mouth daily. For mood control 11/02/16   Armandina Stammer I, NP  ARIPiprazole ER 400 MG SRER Inject 400 mg into the muscle every 28 (twenty-eight) days. (Due 11-28-16): For mood control 11/28/16   Armandina Stammer I, NP  hydrOXYzine (ATARAX/VISTARIL) 25 MG tablet Take 1 tablet (25 mg total) by mouth every 4 (four) hours as needed for anxiety. 11/01/16   Armandina Stammer I, NP  lithium carbonate 300 MG capsule Take 3 capsules (900 mg total) by mouth at bedtime. For mood stabilization 11/01/16   Armandina Stammer I, NP  nicotine polacrilex (NICORETTE) 2 MG gum Take 1 each (2 mg total) by mouth as needed for smoking cessation. 11/01/16   Armandina Stammer I, NP    traZODone (DESYREL) 150 MG tablet Take 1 tablet (150 mg total) by mouth at bedtime. For sleep 11/01/16   Sanjuana Kava, NP    Family History Family History  Problem Relation Age of Onset  . Mental illness Neg Hx     Social History Social History  Substance Use Topics  . Smoking status: Current Every Day Smoker    Types: Cigarettes  . Smokeless tobacco: Never Used  . Alcohol use No     Allergies   Cogentin [benztropine]; Depakote [divalproex sodium]; Valproic acid; and Penicillins   Review of Systems Review of Systems  Constitutional: Negative for chills and fever.  Gastrointestinal: Negative for nausea and vomiting.  Skin: Positive for wound. Negative for color change.     Physical Exam Updated Vital Signs BP 125/79 (BP Location: Left Arm)   Pulse 85   Temp 97.7 F (36.5 C) (Oral)   Resp 16   SpO2 100%   Physical Exam  Constitutional: She appears well-developed and well-nourished. No distress.  HENT:  Head: Normocephalic and atraumatic.  Eyes: Conjunctivae and EOM are normal. No scleral icterus.  Neck: Normal range of motion.  Pulmonary/Chest: Effort normal. No respiratory distress.  Neurological: She is alert.  Skin: No rash noted. She is not diaphoretic.  3 puncture wounds that appear well healing on the left inner thigh.no associated swelling or erythema  noted. No drainage or bleeding noted from site.  Psychiatric: She has a normal mood and affect.  Nursing note and vitals reviewed.    ED Treatments / Results  Labs (all labs ordered are listed, but only abnormal results are displayed) Labs Reviewed - No data to display  EKG  EKG Interpretation None       Radiology No results found.  Procedures Procedures (including critical care time)  Medications Ordered in ED Medications  clindamycin (CLEOCIN) capsule 150 mg (150 mg Oral Not Given 12/12/16 2102)  doxycycline (VIBRA-TABS) tablet 100 mg (100 mg Oral Given 12/12/16 2102)     Initial  Impression / Assessment and Plan / ED Course  I have reviewed the triage vital signs and the nursing notes.  Pertinent labs & imaging results that were available during my care of the patient were reviewed by me and considered in my medical decision making (see chart for details).     Patient presents to ED for evaluation of dog bite that occurred 3 days ago. She was unable to fill her antibiotic prescriptions due to the price. On physical exam there are 3 well-healing puncture wounds present on the left thigh. There is no overlying cellulitis or signs of other infection. She is afebrile with no history of fever. She denies any subsequent bites or injuries to the area. She states she received her rabies vaccination [facility]. Will give patient one dose of antibiotics (Doxy) Here in the ED (she was prescribed Doxy and Clinda due to her penicillin allergy). Will advise patient to take doxycycline as directed informed patient that she can get this for around $16-$20. Patient agreed to get doxycycline filled and to take as directed. Patient appears stable for discharge at this time. Strict return precautions given.  Final Clinical Impressions(s) / ED Diagnoses   Final diagnoses:  Visit for wound check  Dog bite, subsequent encounter    New Prescriptions New Prescriptions   No medications on file     Dietrich PatesKhatri, Shena Vinluan, Cordelia Poche-C 12/12/16 2105    Tegeler, Canary Brimhristopher J, MD 12/13/16 [phone]

## 2016-12-12 NOTE — ED Triage Notes (Signed)
Pt here via [employer] with c/o pain in right of thigh following being bitten by a dog 3 days ago. Pt was at [facility] with a man who gave her a debit card to pay for her rx. Pt states the card did not work and when she went to find the man, he was gone. Pt then caused a disturbance forcing the [facility] employees to call [employer] who brought pt here. Pt states she is feeling emotional but she only wants to be evaluated for the dog bite. Bite consists of 3 puncture marks that are scabbed over. Pt denies drainage, fever, or chills. Minimal swelling. No warmth to touch. Pt is not febrile nor tachycardic

## 2016-12-12 NOTE — Discharge Instructions (Signed)
Take doxycycline as directed. Return to ED for worsening pain, trouble walking, numbness, weakness, additional injury.

## 2016-12-13 MED ORDER — DOXYCYCLINE HYCLATE 100 MG PO CAPS: 100.0000 mg | ORAL_CAPSULE | Freq: Two times a day (BID) | ORAL | 0 refills | Status: DC

## 2016-12-14 MED ORDER — DOXYCYCLINE HYCLATE 100 MG PO CAPS: 100.0000 mg | ORAL_CAPSULE | Freq: Two times a day (BID) | ORAL | 0 refills | Status: DC

## 2016-12-16 ENCOUNTER — Encounter (HOSPITAL_COMMUNITY): Payer: Self-pay | Admitting: Emergency Medicine

## 2016-12-16 ENCOUNTER — Emergency Department (HOSPITAL_COMMUNITY)
Admission: EM | Admit: 2016-12-16 | Discharge: 2016-12-17 | Disposition: A | Discharge status: Home / Self Care | Attending: Emergency Medicine | Admitting: Emergency Medicine

## 2016-12-16 DIAGNOSIS — F1290 Cannabis use, unspecified, uncomplicated: Secondary | ICD-10-CM | POA: Insufficient documentation

## 2016-12-16 DIAGNOSIS — F1410 Cocaine abuse, uncomplicated: Secondary | ICD-10-CM | POA: Diagnosis present

## 2016-12-16 DIAGNOSIS — F319 Bipolar disorder, unspecified: Secondary | ICD-10-CM

## 2016-12-16 DIAGNOSIS — F312 Bipolar disorder, current episode manic severe with psychotic features: Secondary | ICD-10-CM | POA: Diagnosis present

## 2016-12-16 DIAGNOSIS — F3170 Bipolar disorder, currently in remission, most recent episode unspecified: Secondary | ICD-10-CM

## 2016-12-16 DIAGNOSIS — F1210 Cannabis abuse, uncomplicated: Secondary | ICD-10-CM | POA: Diagnosis present

## 2016-12-16 DIAGNOSIS — F329 Major depressive disorder, single episode, unspecified: Secondary | ICD-10-CM | POA: Insufficient documentation

## 2016-12-16 DIAGNOSIS — F17210 Nicotine dependence, cigarettes, uncomplicated: Secondary | ICD-10-CM | POA: Insufficient documentation

## 2016-12-16 DIAGNOSIS — R45851 Suicidal ideations: Secondary | ICD-10-CM | POA: Insufficient documentation

## 2016-12-16 DIAGNOSIS — Z9114 Patient's other noncompliance with medication regimen: Secondary | ICD-10-CM | POA: Insufficient documentation

## 2016-12-16 DIAGNOSIS — F1414 Cocaine abuse with cocaine-induced mood disorder: Secondary | ICD-10-CM | POA: Diagnosis present

## 2016-12-16 LAB — CBC WITH DIFFERENTIAL/PLATELET
Basophils Absolute: 0 10*3/uL (ref 0.0–0.1)
Basophils Relative: 0 %
Eosinophils Absolute: 0 10*3/uL (ref 0.0–0.7)
Eosinophils Relative: 0 %
HCT: 34.4 % — ABNORMAL LOW (ref 36.0–46.0)
Hemoglobin: 11.9 g/dL — ABNORMAL LOW (ref 12.0–15.0)
Lymphocytes Relative: 34 %
Lymphs Abs: 2.6 10*3/uL (ref 0.7–4.0)
MCH: 33.6 pg (ref 26.0–34.0)
MCHC: 34.6 g/dL (ref 30.0–36.0)
MCV: 97.2 fL (ref 78.0–100.0)
Monocytes Absolute: 0.6 10*3/uL (ref 0.1–1.0)
Monocytes Relative: 8 %
Neutro Abs: 4.5 10*3/uL (ref 1.7–7.7)
Neutrophils Relative %: 58 %
Platelets: 275 10*3/uL (ref 150–400)
RBC: 3.54 MIL/uL — ABNORMAL LOW (ref 3.87–5.11)
RDW: 13.2 % (ref 11.5–15.5)
WBC: 7.7 10*3/uL (ref 4.0–10.5)

## 2016-12-16 LAB — COMPREHENSIVE METABOLIC PANEL
ALT: 16 U/L (ref 14–54)
AST: 22 U/L (ref 15–41)
Albumin: 3.8 g/dL (ref 3.5–5.0)
Alkaline Phosphatase: 37 U/L — ABNORMAL LOW (ref 38–126)
Anion gap: 7 (ref 5–15)
BUN: 9 mg/dL (ref 6–20)
CO2: 23 mmol/L (ref 22–32)
Calcium: 8.7 mg/dL — ABNORMAL LOW (ref 8.9–10.3)
Chloride: 107 mmol/L (ref 101–111)
Creatinine, Ser: 0.77 mg/dL (ref 0.44–1.00)
GFR calc Af Amer: >60 mL/min (ref >60)
GFR calc non Af Amer: >60 mL/min (ref >60)
Glucose, Bld: 89 mg/dL (ref 65–99)
Potassium: 3.8 mmol/L (ref 3.5–5.1)
Sodium: 137 mmol/L (ref 135–145)
Total Bilirubin: 0.5 mg/dL (ref 0.3–1.2)
Total Protein: 6.5 g/dL (ref 6.5–8.1)

## 2016-12-16 LAB — ETHANOL: Alcohol, Ethyl (B): <5 mg/dL (ref <5)

## 2016-12-16 LAB — RAPID URINE DRUG SCREEN, HOSP PERFORMED
Amphetamines: NOT DETECTED
Barbiturates: NOT DETECTED
Benzodiazepines: NOT DETECTED
Cocaine: POSITIVE — AB
Opiates: NOT DETECTED
Tetrahydrocannabinol: POSITIVE — AB

## 2016-12-16 LAB — I-STAT BETA HCG BLOOD, ED (MC, WL, AP ONLY): I-stat hCG, quantitative: <5 m[IU]/mL (ref <5)

## 2016-12-16 LAB — LITHIUM LEVEL: Lithium Lvl: <0.06 mmol/L — ABNORMAL LOW (ref 0.60–1.20)

## 2016-12-16 MED ORDER — TRAZODONE HCL 50 MG PO TABS
150.0000 mg | ORAL_TABLET | Freq: Every day | ORAL | Status: DC
  Administered 2016-12-16: 21:00:00 150 mg via ORAL
  Filled 2016-12-16: qty 1

## 2016-12-16 MED ORDER — LITHIUM CARBONATE 300 MG PO CAPS: 900.0000 mg | ORAL_CAPSULE | Freq: Every day | ORAL | Status: DC

## 2016-12-16 MED ORDER — GABAPENTIN 300 MG PO CAPS
300.0000 mg | ORAL_CAPSULE | Freq: Two times a day (BID) | ORAL | Status: DC
  Administered 2016-12-16 – 2016-12-17 (×3): 300 mg via ORAL
  Filled 2016-12-16 (×3): qty 1

## 2016-12-16 MED ORDER — LITHIUM CARBONATE ER 450 MG PO TBCR
450.0000 mg | EXTENDED_RELEASE_TABLET | Freq: Two times a day (BID) | ORAL | Status: DC
  Administered 2016-12-16 – 2016-12-17 (×3): 450 mg via ORAL
  Filled 2016-12-16 (×3): qty 1

## 2016-12-16 NOTE — Progress Notes (Signed)
CSW filed patient's examination and recommendation paperwork into IVC logbook.  Emanie Behan, LCSWA [facility]  Clinical Social Worker [phone] 

## 2016-12-16 NOTE — ED Triage Notes (Signed)
Per [employer] officer, pt family called to report that pt threatened to "shoot herself in the head." [employer] transported. Per [employer] officer, pt reports that she "smoked weed last night and started tripping." Pt found to be sleeping in room. Another [employer] officer reported to be en route with IVC papers.  Pt resting with even, unlabored resp. Will continue to monitor

## 2016-12-16 NOTE — ED Provider Notes (Signed)
[facility] DEPT Provider Note   CSN: [id number] Arrival date & time: 12/16/16  [id number]     History   Chief Complaint Chief Complaint  Patient presents with  . Suicidal  . IVC    HPI Amy [name] is a 21 y.o. female.  HPI   21 year old female brought in by police with IVC paperwork. She says her mothered called the police to her residence but she claims she is not sure why. Patiently apparently told police that she wanted to shoot herself. Patient states that she said this jokingly. She denies that she has wants to harm herself or anyone else. She does admit to marijuana and cocaine use. She does have a psychiatric history but is not compliant with her medications. She cannot remember the last time she took her lithium. Pt is a rambling historian and it's diffcult for me to obtain a coherent history.    Past Medical History:  Diagnosis Date  . Depression     Patient Active Problem List   Diagnosis Date Noted  . Bipolar disorder, curr episode mixed, severe, with psychotic features (HCC) 10/25/2016  . Cocaine use disorder, mild, abuse 10/25/2016  . Cannabis use disorder, severe, dependence (HCC) 10/25/2016  . Insomnia   . Anxiety state   . Cocaine abuse 07/04/2015  . Cannabis abuse, continuous 07/02/2015  . Non compliance with medical treatment 06/18/2015    History reviewed. No pertinent surgical history.  OB History    No data available       Home Medications    Prior to Admission medications   Medication Sig Start Date End Date Taking? Authorizing Provider  ARIPiprazole (ABILIFY) 20 MG tablet Take 1 tablet (20 mg total) by mouth daily. For mood control 11/02/16   Armandina StammerNwoko, Agnes I, NP  ARIPiprazole ER 400 MG SRER Inject 400 mg into the muscle every 28 (twenty-eight) days. (Due 11-28-16): For mood control 11/28/16   Armandina StammerNwoko, Agnes I, NP  doxycycline (VIBRAMYCIN) 100 MG capsule Take 1 capsule (100 mg total) by mouth 2 (two) times daily. 12/14/16   Long, Arlyss RepressJoshua G, MD    hydrOXYzine (ATARAX/VISTARIL) 25 MG tablet Take 1 tablet (25 mg total) by mouth every 4 (four) hours as needed for anxiety. 11/01/16   Armandina StammerNwoko, Agnes I, NP  lithium carbonate 300 MG capsule Take 3 capsules (900 mg total) by mouth at bedtime. For mood stabilization 11/01/16   Armandina StammerNwoko, Agnes I, NP  nicotine polacrilex (NICORETTE) 2 MG gum Take 1 each (2 mg total) by mouth as needed for smoking cessation. 11/01/16   Armandina StammerNwoko, Agnes I, NP  traZODone (DESYREL) 150 MG tablet Take 1 tablet (150 mg total) by mouth at bedtime. For sleep 11/01/16   Sanjuana KavaNwoko, Agnes I, NP    Family History Family History  Problem Relation Age of Onset  . Mental illness Neg Hx     Social History Social History  Substance Use Topics  . Smoking status: Current Every Day Smoker    Types: Cigarettes  . Smokeless tobacco: Never Used  . Alcohol use No     Allergies   Cogentin [benztropine]; Depakote [divalproex sodium]; Valproic acid; and Penicillins   Review of Systems Review of Systems  All systems reviewed and negative, other than as noted in HPI.  Physical Exam Updated Vital Signs BP (!) 128/56 (BP Location: Left Arm)   Pulse 77   Temp 97.9 F (36.6 C) (Oral)   Resp 18   LMP  (LMP Unknown)   SpO2 98%  Physical Exam  Constitutional: She is oriented to person, place, and time. She appears well-developed and well-nourished. No distress.  HENT:  Head: Normocephalic and atraumatic.  Eyes: Conjunctivae are normal. Right eye exhibits no discharge. Left eye exhibits no discharge.  Injected conjunctiva  Neck: Neck supple.  Cardiovascular: Normal rate, regular rhythm and normal heart sounds.  Exam reveals no gallop and no friction rub.   No murmur heard. Pulmonary/Chest: Effort normal and breath sounds normal. No respiratory distress.  Abdominal: Soft. She exhibits no distension. There is no tenderness.  Musculoskeletal: She exhibits no edema or tenderness.  Neurological: She is alert and oriented to person, place,  and time.  Skin: Skin is warm and dry.  Psychiatric:  Drowsy. Disorganized thoughts. Doesn't appear to be responding to internal stimuli.   Nursing note and vitals reviewed.    ED Treatments / Results  Labs (all labs ordered are listed, but only abnormal results are displayed) Labs Reviewed - No data to display  EKG  EKG Interpretation None       Radiology No results found.  Procedures Procedures (including critical care time)  Medications Ordered in ED Medications - No data to display   Initial Impression / Assessment and Plan / ED Course  I have reviewed the triage vital signs and the nursing notes.  Pertinent labs & imaging results that were available during my care of the patient were reviewed by me and considered in my medical decision making (see chart for details).     20yF who made suicidal threats. Denying to me but unable to give me a coherent history. Does admit to drug use. Some of this behavior is likely drug induced. Also hx of anxiety and bipolar and not taking meds.   Final Clinical Impressions(s) / ED Diagnoses   Final diagnoses:  Suicidal thoughts    New Prescriptions New Prescriptions   No medications on file     Raeford RazorKohut, Kwadwo Taras, MD 12/16/16 [phone]

## 2016-12-16 NOTE — ED Notes (Signed)
Patient has been sleeping majority of the day.  She has been calm and cooperative.  Patient states she has passive thoughts of self harm with no specific plan.  Patient states that she felt she was "in another dimension."  Patient has restarted her medication, stating, "I've been off it a couple of months."

## 2016-12-16 NOTE — ED Notes (Signed)
Pt provided with scrubs and socks. Pt belongings wanded by security
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
## 2016-12-16 NOTE — ED Notes (Signed)
Pt provided with meal tray.

## 2016-12-17 DIAGNOSIS — F17210 Nicotine dependence, cigarettes, uncomplicated: Secondary | ICD-10-CM

## 2016-12-17 DIAGNOSIS — F1290 Cannabis use, unspecified, uncomplicated: Secondary | ICD-10-CM

## 2016-12-17 DIAGNOSIS — F1910 Other psychoactive substance abuse, uncomplicated: Secondary | ICD-10-CM

## 2016-12-17 DIAGNOSIS — F1414 Cocaine abuse with cocaine-induced mood disorder: Secondary | ICD-10-CM

## 2016-12-17 DIAGNOSIS — F3170 Bipolar disorder, currently in remission, most recent episode unspecified: Secondary | ICD-10-CM
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
## 2016-12-17 NOTE — ED Notes (Signed)
Patient continues to sleep this morning.  Will be evaluated by psychiatrist today and disposition will be decided.

## 2016-12-17 NOTE — ED Notes (Signed)
Patient states, "I can't go to rehab because I'm a part of the government now."  "I know about the luminati."  Patient feels that she does not need rehab at this time.  Plan for discharge home.

## 2016-12-17 NOTE — Discharge Instructions (Signed)
For your ongoing mental health needs, you are advised to follow up with [facility]. [facility] [address] [place], [place] [address]  [phone]

## 2016-12-17 NOTE — ED Notes (Signed)
Patient awake and eating breakfast.  She states, "I said the wrong thing and I got arrested."  "I told the cop that I didn't want a bad cop to shoot me so I said I'll shoot myself instead." Patient wants to "just go home and be with my mom."  Patient denies any thoughts of self harm today.  Her speech is organized and she is future oriented.

## 2016-12-17 NOTE — Progress Notes (Signed)
CSW filed patient's notice of commitment change paperwork into IVC logbook.   Miquel Stacks, LCSWA [facility]  Clinical Social Worker [phone] 

## 2016-12-17 NOTE — Consult Note (Signed)
[facility] Consult   Reason for Consult:  Cocaine abuse with suicidal ideations Referring Physician:  EDP Patient Identification: Amy [name] MRN:  [id number] Principal Diagnosis: Bipolar affective disorder [facility]) Diagnosis:   Patient Active Problem List   Diagnosis Date Noted  . Cocaine abuse [F14.10] 07/04/2015    Priority: High  . Cannabis abuse, continuous [F12.10] 07/02/2015    Priority: High  . Bipolar affective disorder ([place]) [F31.9] 03/02/2015    Priority: High  . Cocaine abuse with cocaine-induced mood disorder ([place]) [F14.14] 12/16/2016  . Bipolar disorder, curr episode mixed, severe, with psychotic features ([place]) [F31.64] 10/25/2016  . Cocaine use disorder, mild, abuse [F14.10] 10/25/2016  . Cannabis use disorder, severe, dependence ([place]) [F12.20] 10/25/2016  . Insomnia [G47.00]   . Anxiety state [F41.1]   . Non compliance with medical treatment [Z91.19] 06/18/2015    Total Time spent with patient: 45 minutes  Subjective:   Amy [name] is a 21 y.o. female patient does not warrant admission.  HPI:  22 yo female who presented to the ED after using cocaine and saying she was suicidal but said she was "joking".  Kept for 24 hours to assure safety.  Continues to deny suicidal/homicidal ideations, hallucinations, and withdrawal symptoms.  Patient at [facility] and taking her Lithium and Trazodone eractically.  Educated her on the importance of daily use and refraining from drugs.  Stable for discharge.  Past Psychiatric History: bipolar d/o, substance abuse  Risk to Self: None Risk to Others: Homicidal Ideation: No Thoughts of Harm to Others: No Current Homicidal Intent: No Current Homicidal Plan: No Access to Homicidal Means: No Identified Victim: NA History of harm to others?: No Assessment of Violence: None Noted Violent Behavior Description: NA Does patient have access to weapons?: No Criminal Charges Pending?: No Does patient have a court date:  No Prior Inpatient Therapy: Prior Inpatient Therapy: Yes Prior Therapy Dates: 2018 Prior Therapy Facilty/Provider(s): [facility], [facility] Reason for Treatment: MH issues Prior Outpatient Therapy: Prior Outpatient Therapy: Yes Prior Therapy Dates: 2017 Prior Therapy Facilty/Provider(s): [facility] Reason for Treatment: MH issues Does patient have an ACCT team?: No Does patient have Intensive In-House Services?  : No Does patient have [facility] services? : No Does patient have P4CC services?: No  Past Medical History:  Past Medical History:  Diagnosis Date  . Depression    History reviewed. No pertinent surgical history. Family History:  Family History  Problem Relation Age of Onset  . Mental illness Neg Hx    Family Psychiatric  History: unknown Social History:  History  Alcohol Use No     History  Drug Use  . Types: Marijuana    Comment: denies    Social History   Social History  . Marital status: Single    Spouse name: [name]  . Number of children: [name]  . Years of education: [name]   Social History Main Topics  . Smoking status: Current Every Day Smoker    Types: Cigarettes  . Smokeless tobacco: Never Used  . Alcohol use No  . Drug use: Yes    Types: Marijuana     Comment: denies  . Sexual activity: Not Asked   Other Topics Concern  . None   Social History Narrative  . None   Additional Social History:    Allergies:   Allergies  Allergen Reactions  . Cogentin [Benztropine]   . Depakote [Divalproex Sodium] Other (See Comments)    Creates feelings of paranoia, some suicidal feelings, and makes  her feel that "people are coming after" her  . Valproic Acid Other (See Comments)    Creates paranoia/Per [facility]  . Penicillins Rash    Has patient had a PCN reaction causing immediate rash, facial/tongue/throat swelling, SOB or lightheadedness with hypotension: Yes Has patient had a PCN reaction causing severe rash involving mucus membranes or skin necrosis: No Has patient  had a PCN reaction that required hospitalization: No Has patient had a PCN reaction occurring within the last 10 years: Yes If all of the above answers are "NO", then may     Labs:  Results for orders placed or performed during the hospital encounter of 12/16/16 (from the past 48 hour(s))  Lithium level     Status: Abnormal   Collection Time: 12/16/16  8:05 AM  Result Value Ref Range   Lithium Lvl <0.06 (L) 0.60 - 1.20 mmol/L  Comprehensive metabolic panel     Status: Abnormal   Collection Time: 12/16/16  8:05 AM  Result Value Ref Range   Sodium 137 135 - 145 mmol/L   Potassium 3.8 3.5 - 5.1 mmol/L   Chloride 107 101 - 111 mmol/L   CO2 23 22 - 32 mmol/L   Glucose, Bld 89 65 - 99 mg/dL   BUN 9 6 - 20 mg/dL   Creatinine, Ser 0.77 0.44 - 1.00 mg/dL   Calcium 8.7 (L) 8.9 - 10.3 mg/dL   Total Protein 6.5 6.5 - 8.1 g/dL   Albumin 3.8 3.5 - 5.0 g/dL   AST 22 15 - 41 U/L   ALT 16 14 - 54 U/L   Alkaline Phosphatase 37 (L) 38 - 126 U/L   Total Bilirubin 0.5 0.3 - 1.2 mg/dL   GFR calc non Af Amer >60 >60 mL/min   GFR calc Af Amer >60 >60 mL/min    Comment: (NOTE) The eGFR has been calculated using the CKD EPI equation. This calculation has not been validated in all clinical situations. eGFR's persistently <60 mL/min signify possible Chronic Kidney Disease.    Anion gap 7 5 - 15  Ethanol     Status: None   Collection Time: 12/16/16  8:05 AM  Result Value Ref Range   Alcohol, Ethyl (B) <5 <5 mg/dL    Comment:        LOWEST DETECTABLE LIMIT FOR SERUM ALCOHOL IS 5 mg/dL FOR MEDICAL PURPOSES ONLY   CBC with Diff     Status: Abnormal   Collection Time: 12/16/16  8:05 AM  Result Value Ref Range   WBC 7.7 4.0 - 10.5 K/uL   RBC 3.54 (L) 3.87 - 5.11 MIL/uL   Hemoglobin 11.9 (L) 12.0 - 15.0 g/dL   HCT 34.4 (L) 36.0 - 46.0 %   MCV 97.2 78.0 - 100.0 fL   MCH 33.6 26.0 - 34.0 pg   MCHC 34.6 30.0 - 36.0 g/dL   RDW 13.2 11.5 - 15.5 %   Platelets 275 150 - 400 K/uL   Neutrophils  Relative % 58 %   Neutro Abs 4.5 1.7 - 7.7 K/uL   Lymphocytes Relative 34 %   Lymphs Abs 2.6 0.7 - 4.0 K/uL   Monocytes Relative 8 %   Monocytes Absolute 0.6 0.1 - 1.0 K/uL   Eosinophils Relative 0 %   Eosinophils Absolute 0.0 0.0 - 0.7 K/uL   Basophils Relative 0 %   Basophils Absolute 0.0 0.0 - 0.1 K/uL  I-Stat beta hCG blood, ED     Status: None   Collection  Time: 12/16/16  8:13 AM  Result Value Ref Range   I-stat hCG, quantitative <5.0 <5 mIU/mL   Comment 3            Comment:   GEST. AGE      CONC.  (mIU/mL)   <=1 WEEK        5 - 50     2 WEEKS       50 - 500     3 WEEKS       100 - 10,000     4 WEEKS     1,000 - 30,000        FEMALE AND NON-PREGNANT FEMALE:     LESS THAN 5 mIU/mL   Urine rapid drug screen (hosp performed)     Status: Abnormal   Collection Time: 12/16/16  8:32 AM  Result Value Ref Range   Opiates NONE DETECTED NONE DETECTED   Cocaine POSITIVE (A) NONE DETECTED   Benzodiazepines NONE DETECTED NONE DETECTED   Amphetamines NONE DETECTED NONE DETECTED   Tetrahydrocannabinol POSITIVE (A) NONE DETECTED   Barbiturates NONE DETECTED NONE DETECTED    Comment:        DRUG SCREEN FOR MEDICAL PURPOSES ONLY.  IF CONFIRMATION IS NEEDED FOR ANY PURPOSE, NOTIFY LAB WITHIN 5 DAYS.        LOWEST DETECTABLE LIMITS FOR URINE DRUG SCREEN Drug Class       Cutoff (ng/mL) Amphetamine      1000 Barbiturate      200 Benzodiazepine   270 Tricyclics       623 Opiates          300 Cocaine          300 THC              50     Current Facility-Administered Medications  Medication Dose Route Frequency Provider Last Rate Last Dose  . gabapentin (NEURONTIN) capsule 300 mg  300 mg Oral BID Darleene Cleaver, Naz Denunzio, MD   300 mg at 12/17/16 0910  . lithium carbonate (ESKALITH) CR tablet 450 mg  450 mg Oral Q12H Evadne Ose, MD   450 mg at 12/17/16 0909  . traZODone (DESYREL) tablet 150 mg  150 mg Oral QHS Corena Pilgrim, MD   150 mg at 12/16/16 2103   Current Outpatient  Prescriptions  Medication Sig Dispense Refill  . ARIPiprazole ER 400 MG SRER Inject 400 mg into the muscle every 28 (twenty-eight) days. (Due 11-28-16): For mood control 1 each 0  . lithium carbonate 300 MG capsule Take 3 capsules (900 mg total) by mouth at bedtime. For mood stabilization 90 capsule 0  . traZODone (DESYREL) 150 MG tablet Take 1 tablet (150 mg total) by mouth at bedtime. For sleep (Patient not taking: Reported on 12/16/2016) 30 tablet 0    Musculoskeletal: Strength & Muscle Tone: within normal limits Gait & Station: normal Patient leans: [name]  Psychiatric Specialty Exam: Physical Exam  Constitutional: She is oriented to person, place, and time. She appears well-developed and well-nourished.  HENT:  Head: Normocephalic.  Neck: Normal range of motion.  Respiratory: Effort normal.  GI: Soft.  Musculoskeletal: Normal range of motion.  Neurological: She is alert and oriented to person, place, and time.  Psychiatric: She has a normal mood and affect. Her speech is normal and behavior is normal. Judgment and thought content normal. Cognition and memory are normal.    Review of Systems  Psychiatric/Behavioral: Positive for substance abuse.  All other systems reviewed and  are negative.   Blood pressure (!) 98/56, pulse 62, temperature 98.4 F (36.9 C), temperature source Oral, resp. rate 16, SpO2 100 %.There is no height or weight on file to calculate BMI.  General Appearance: Casual  Eye Contact:  Good  Speech:  Normal Rate  Volume:  Normal  Mood:  Euthymic  Affect:  Congruent  Thought Process:  Coherent and Descriptions of Associations: Intact  Orientation:  Full (Time, Place, and Person)  Thought Content:  WDL and Logical  Suicidal Thoughts:  No  Homicidal Thoughts:  No  Memory:  Immediate;   Good Recent;   Good Remote;   Good  Judgement:  Fair  Insight:  Fair  Psychomotor Activity:  Normal  Concentration:  Concentration: Good and Attention Span: Good  Recall:   Good  Fund of Knowledge:  Fair  Language:  Good  Akathisia:  No  Handed:  Right  AIMS (if indicated):     Assets:  Housing Leisure Time Physical Health Resilience Social Support  ADL's:  Intact  Cognition:  WNL  Sleep:        Treatment Plan Summary: Daily contact with patient to assess and evaluate symptoms and progress in treatment, Medication management and Plan cocaine abuse with cocaine induced mood disorder:  -Crisis stabilization -Medication management:  Gabapentin 300 mg BID for cocaine dependence, Trazodone 150 mg at bedtime for sleep, and Lithium 450 mg BID for mood stabilization -Individual and substance abuse counseling  Disposition: No evidence of imminent risk to self or others at present.    Waylan Boga, NP 12/17/2016 10:03 AM  Patient seen face-to-face for psychiatric evaluation, chart reviewed and case discussed with the physician extender and developed treatment plan. Reviewed the information documented and agree with the treatment plan. Corena Pilgrim, MD

## 2016-12-17 NOTE — [facility] Suicide Risk Assessment (Signed)
Suicide Risk Assessment  Discharge Assessment   [facility]   Principal Problem: Bipolar affective disorder [facility](HCC) Discharge Diagnoses:  Patient Active Problem List   Diagnosis Date Noted  . Cocaine abuse [F14.10] 07/04/2015    Priority: High  . Cannabis abuse, continuous [F12.10] 07/02/2015    Priority: High  . Bipolar affective disorder ([facility]) [F31.9] 03/02/2015    Priority: High  . Cocaine abuse with cocaine-induced mood disorder ([facility]) [F14.14] 12/16/2016  . Bipolar disorder, curr episode mixed, severe, with psychotic features ([facility]) [F31.64] 10/25/2016  . Cocaine use disorder, mild, abuse [F14.10] 10/25/2016  . Cannabis use disorder, severe, dependence ([facility]) [F12.20] 10/25/2016  . Insomnia [G47.00]   . Anxiety state [F41.1]   . Non compliance with medical treatment [Z91.19] 06/18/2015    Total Time spent with patient: 45 minutes  Musculoskeletal: Strength & Muscle Tone: within normal limits Gait & Station: normal Patient leans: [name]  Psychiatric Specialty Exam: Physical Exam  Constitutional: She is oriented to person, place, and time. She appears well-developed and well-nourished.  HENT:  Head: Normocephalic.  Neck: Normal range of motion.  Respiratory: Effort normal.  GI: Soft.  Musculoskeletal: Normal range of motion.  Neurological: She is alert and oriented to person, place, and time.  Psychiatric: She has a normal mood and affect. Her speech is normal and behavior is normal. Judgment and thought content normal. Cognition and memory are normal.    Review of Systems  Psychiatric/Behavioral: Positive for substance abuse.  All other systems reviewed and are negative.   Blood pressure (!) 98/56, pulse 62, temperature 98.4 F (36.9 C), temperature source Oral, resp. rate 16, SpO2 100 %.There is no height or weight on file to calculate BMI.  General Appearance: Casual  Eye Contact:  Good  Speech:  Normal Rate  Volume:  Normal  Mood:   Euthymic  Affect:  Congruent  Thought Process:  Coherent and Descriptions of Associations: Intact  Orientation:  Full (Time, Place, and Person)  Thought Content:  WDL and Logical  Suicidal Thoughts:  No  Homicidal Thoughts:  No  Memory:  Immediate;   Good Recent;   Good Remote;   Good  Judgement:  Fair  Insight:  Fair  Psychomotor Activity:  Normal  Concentration:  Concentration: Good and Attention Span: Good  Recall:  Good  Fund of Knowledge:  Fair  Language:  Good  Akathisia:  No  Handed:  Right  AIMS (if indicated):     Assets:  Housing Leisure Time Physical Health Resilience Social Support  ADL's:  Intact  Cognition:  WNL  Sleep:       Mental Status Per Nursing Assessment::   On Admission:   cocaine abuse with suicidal ideations  Demographic Factors:  Adolescent or young adult  Loss Factors: NA  Historical Factors: NA  Risk Reduction Factors:   Sense of responsibility to family, Living with another person, especially a relative, Positive social support and Positive therapeutic relationship  Continued Clinical Symptoms:  None  Cognitive Features That Contribute To Risk:  None    Suicide Risk:  Minimal: No identifiable suicidal ideation.  Patients presenting with no risk factors but with morbid ruminations; may be classified as minimal risk based on the severity of the depressive symptoms    Plan Of Care/Follow-up recommendations:  Activity:  as tolerated Diet:  heart healhty diet  Daleisa Halperin, NP 12/17/2016, 10:10 AM

## 2017-04-22 DIAGNOSIS — F250 Schizoaffective disorder, bipolar type: Secondary | ICD-10-CM | POA: Diagnosis present

## 2017-06-17 ENCOUNTER — Encounter (HOSPITAL_BASED_OUTPATIENT_CLINIC_OR_DEPARTMENT_OTHER): Payer: Self-pay | Admitting: Adult Health

## 2017-06-17 ENCOUNTER — Emergency Department (HOSPITAL_BASED_OUTPATIENT_CLINIC_OR_DEPARTMENT_OTHER)
Admission: EM | Admit: 2017-06-17 | Discharge: 2017-06-17 | Discharge status: Against Medical Advice | Payer: Self-pay | Attending: Emergency Medicine | Admitting: Emergency Medicine

## 2017-06-17 ENCOUNTER — Other Ambulatory Visit: Payer: Self-pay

## 2017-06-17 DIAGNOSIS — Z5321 Procedure and treatment not carried out due to patient leaving prior to being seen by health care provider: Secondary | ICD-10-CM | POA: Insufficient documentation

## 2017-06-17 DIAGNOSIS — Z4802 Encounter for removal of sutures: Secondary | ICD-10-CM | POA: Insufficient documentation

## 2017-06-17 NOTE — ED Triage Notes (Signed)
Presents with sutures to right thumb, here for removal, site has yellow drainage and is tender to touch.

## 2017-06-17 NOTE — ED Notes (Signed)
PT did not answer when name was called

## 2017-06-18 ENCOUNTER — Other Ambulatory Visit: Payer: Self-pay

## 2017-06-18 ENCOUNTER — Encounter (HOSPITAL_BASED_OUTPATIENT_CLINIC_OR_DEPARTMENT_OTHER): Payer: Self-pay | Admitting: Emergency Medicine

## 2017-06-18 ENCOUNTER — Emergency Department (HOSPITAL_BASED_OUTPATIENT_CLINIC_OR_DEPARTMENT_OTHER)
Admission: EM | Admit: 2017-06-18 | Discharge: 2017-06-18 | Disposition: A | Discharge status: Home / Self Care | Payer: Self-pay | Attending: Physician Assistant | Admitting: Physician Assistant

## 2017-06-18 DIAGNOSIS — F17210 Nicotine dependence, cigarettes, uncomplicated: Secondary | ICD-10-CM | POA: Insufficient documentation

## 2017-06-18 DIAGNOSIS — Z79899 Other long term (current) drug therapy: Secondary | ICD-10-CM | POA: Insufficient documentation

## 2017-06-18 DIAGNOSIS — Z4802 Encounter for removal of sutures: Secondary | ICD-10-CM | POA: Insufficient documentation

## 2017-06-18 DIAGNOSIS — L03011 Cellulitis of right finger: Secondary | ICD-10-CM | POA: Insufficient documentation

## 2017-06-18 MED ORDER — SULFAMETHOXAZOLE-TRIMETHOPRIM 800-160 MG PO TABS: 1.0000 | ORAL_TABLET | Freq: Two times a day (BID) | ORAL | 0 refills | Status: AC

## 2017-06-18 MED FILL — SULFAMETHOXAZOLE/TMP DS TAB: 800-160 | 7 days supply | Qty: 14 | Fill #0

## 2017-06-18 NOTE — Discharge Instructions (Signed)
It was my pleasure taking care of you today!   It is important that you keep your wounds clean and dry. Wash with soap and water multiple times per day. Change dressings frequently. You should not have a bandage on more than 1 full day.   Return to ER for new or worsening symptoms, any additional concerns.

## 2017-06-18 NOTE — ED Notes (Signed)
Wounds cleaned and band aides placed

## 2017-06-18 NOTE — ED Provider Notes (Signed)
[facility] EMERGENCY DEPARTMENT Provider Note   CSN: [id number] Arrival date & time: 06/18/17  1525     History   Chief Complaint Chief Complaint  Patient presents with  . Suture / Staple Removal    HPI Amy [name] is a 22 y.o. female.  The history is provided by the patient and medical records. No language interpreter was used.    Amy [name] is a 22 y.o. female who presents to ED for suture removal. She states that several lacerations were repaired at [facility] after she "went through some glass". This was done on 1/12. She was told to get stiches out in 1 week. She states that she has not been cleaning them because it hurt too badly to get the band-aid off, therefore she has just kept the wounds covered with the same band-aid since injury. Laceration to thumb has started to become more painful over the last 3-4 days. Denies any redness / swelling / drainage. Has not been on ABX prophylactically.   Past Medical History:  Diagnosis Date  . Depression     Patient Active Problem List   Diagnosis Date Noted  . Cocaine abuse with cocaine-induced mood disorder (HCC) 12/16/2016  . Bipolar disorder, curr episode mixed, severe, with psychotic features (HCC) 10/25/2016  . Cocaine use disorder, mild, abuse (HCC) 10/25/2016  . Cannabis use disorder, severe, dependence (HCC) 10/25/2016  . Insomnia   . Anxiety state   . Cocaine abuse (HCC) 07/04/2015  . Cannabis abuse, continuous 07/02/2015  . Non compliance with medical treatment 06/18/2015  . Bipolar affective disorder (HCC) 03/02/2015    History reviewed. No pertinent surgical history.  OB History    No data available       Home Medications    Prior to Admission medications   Medication Sig Start Date End Date Taking? Authorizing Provider  ARIPiprazole ER 400 MG SRER Inject 400 mg into the muscle every 28 (twenty-eight) days. (Due 11-28-16): For mood control 11/28/16   Armandina StammerNwoko, Agnes I, NP    haloperidol (HALDOL) 20 MG tablet Take 20 mg by mouth once.    [provider]  lithium carbonate 300 MG capsule Take 3 capsules (900 mg total) by mouth at bedtime. For mood stabilization 11/01/16   Armandina StammerNwoko, Agnes I, NP  sulfamethoxazole-trimethoprim (BACTRIM DS,SEPTRA DS) 800-160 MG tablet Take 1 tablet by mouth 2 (two) times daily for 7 days. 06/18/17 06/25/17  Ward, Chase PicketJaime Pilcher, PA-C  traZODone (DESYREL) 150 MG tablet Take 1 tablet (150 mg total) by mouth at bedtime. For sleep Patient not taking: Reported on 12/16/2016 11/01/16   Sanjuana KavaNwoko, Agnes I, NP    Family History Family History  Problem Relation Age of Onset  . Mental illness Neg Hx     Social History Social History   Tobacco Use  . Smoking status: Current Every Day Smoker    Types: Cigarettes  . Smokeless tobacco: Never Used  Substance Use Topics  . Alcohol use: No  . Drug use: Yes    Types: Marijuana    Comment: denies     Allergies   Cogentin [benztropine]; Depakote [divalproex sodium]; Valproic acid; and Penicillins   Review of Systems Review of Systems  Skin: Positive for wound. Negative for color change.  Neurological: Negative for weakness and numbness.     Physical Exam Updated Vital Signs BP (!) 148/72 (BP Location: Left Arm)   Pulse 81   Temp 98.6 F (37 C) (Oral)   Resp 18  Ht 6\' 1"  (1.854 m)   Wt 77.1 kg (170 lb)   LMP 05/18/2017   SpO2 99%   BMI 22.43 kg/m   Physical Exam  Constitutional: She appears well-developed and well-nourished. No distress.  HENT:  Head: Normocephalic and atraumatic.  Neck: Neck supple.  Cardiovascular: Normal rate, regular rhythm and normal heart sounds.  No murmur heard. Pulmonary/Chest: Effort normal and breath sounds normal. No respiratory distress. She has no wheezes. She has no rales.  Musculoskeletal: Normal range of motion.  Neurological: She is alert.  Skin: Skin is warm and dry.  Two lacerations to the right wrist: one with 7 sutures in place,  one with 5. No surrounding erythema or drainage. Right middle finger with two lacerations, one with 3 sutures in place, one with two in place. No surrounding erythema or drainage. Right ring finger laceration with 3 sutures in place. No surrounding erythema or drainage. Right thumb with 9 sutures in place. This area is tender with some surrounding erythema. No drainage. No abscess appreciated. No tenderness to the finger pad.  Nursing note and vitals reviewed.    ED Treatments / Results  Labs (all labs ordered are listed, but only abnormal results are displayed) Labs Reviewed - No data to display  EKG  EKG Interpretation None       Radiology No results found.  Procedures .Suture Removal Date/Time: 06/18/2017 4:26 PM Performed by: Ward, Chase Picket, PA-C Authorized by: Ward, Chase Picket, PA-C   Consent:    Consent obtained:  Verbal   Consent given by:  Patient   Risks discussed:  Bleeding, pain and wound separation Location:    Location:  Upper extremity   Upper extremity location:  Wrist   Wrist location:  R wrist Procedure details:    Wound appearance:  No signs of infection and good wound healing   Number of sutures removed:  7 Post-procedure details:    Post-removal:  Antibiotic ointment applied and dressing applied   Patient tolerance of procedure:  Tolerated well, no immediate complications .Suture Removal Date/Time: 06/18/2017 4:27 PM Performed by: Ward, Chase Picket, PA-C Authorized by: Ward, Chase Picket, PA-C   Consent:    Consent obtained:  Verbal   Consent given by:  Patient   Risks discussed:  Bleeding, pain and wound separation Location:    Location:  Upper extremity   Upper extremity location:  Wrist   Wrist location:  R wrist Procedure details:    Wound appearance:  No signs of infection   Number of sutures removed:  5 Post-procedure details:    Post-removal:  Antibiotic ointment applied and dressing applied   Patient tolerance of  procedure:  Tolerated well, no immediate complications .Suture Removal Date/Time: 06/18/2017 4:27 PM Performed by: Ward, Chase Picket, PA-C Authorized by: Ward, Chase Picket, PA-C   Consent:    Consent obtained:  Verbal   Consent given by:  Patient   Risks discussed:  Bleeding, pain and wound separation Location:    Location:  Upper extremity   Upper extremity location:  Hand   Hand location:  R long finger Procedure details:    Wound appearance:  No signs of infection   Number of sutures removed:  3 Post-procedure details:    Post-removal:  Antibiotic ointment applied and dressing applied   Patient tolerance of procedure:  Tolerated well, no immediate complications .Suture Removal Date/Time: 06/18/2017 4:27 PM Performed by: Ward, Chase Picket, PA-C Authorized by: Ward, Chase Picket, PA-C   Consent:  Consent obtained:  Verbal   Consent given by:  Patient   Risks discussed:  Bleeding, pain and wound separation Location:    Location:  Upper extremity   Upper extremity location:  Hand   Hand location:  R long finger Procedure details:    Wound appearance:  No signs of infection   Number of sutures removed:  2 Post-procedure details:    Post-removal:  Antibiotic ointment applied and dressing applied   Patient tolerance of procedure:  Tolerated well, no immediate complications .Suture Removal Date/Time: 06/18/2017 4:28 PM Performed by: Ward, Chase Picket, PA-C Authorized by: Ward, Chase Picket, PA-C   Consent:    Consent obtained:  Verbal   Consent given by:  Patient   Risks discussed:  Bleeding, pain and wound separation Location:    Location:  Upper extremity   Upper extremity location:  Hand   Hand location:  R ring finger Procedure details:    Wound appearance:  No signs of infection   Number of sutures removed:  3 Post-procedure details:    Post-removal:  Antibiotic ointment applied and dressing applied   Patient tolerance of procedure:  Tolerated well,  no immediate complications .Suture Removal Date/Time: 06/18/2017 4:28 PM Performed by: Ward, Chase Picket, PA-C Authorized by: Ward, Chase Picket, PA-C   Consent:    Consent obtained:  Verbal   Consent given by:  Patient   Risks discussed:  Bleeding, pain and wound separation Location:    Location:  Upper extremity   Upper extremity location:  Hand   Hand location:  R thumb Procedure details:    Wound appearance:  Tender and red   Number of sutures removed:  9 Post-procedure details:    Post-removal:  Antibiotic ointment applied and dressing applied   Patient tolerance of procedure:  Tolerated well, no immediate complications   (including critical care time)  Medications Ordered in ED Medications - No data to display   Initial Impression / Assessment and Plan / ED Course  I have reviewed the triage vital signs and the nursing notes.  Pertinent labs & imaging results that were available during my care of the patient were reviewed by me and considered in my medical decision making (see chart for details).    Amy [name] is a 22 y.o. female who presents to ED for suture removal from laceration repair performed at [facility] on 1/12. Six lacerations in total, 5 of which with no signs of infection. Laceration to the thumb with surrounding erythema and tender to palpation. No drainage. No signs of abscess or felon. All sutures removed without complication. Concerned for cellulitis of thumb. PCN allergy. Will start on bactrim. Discussed wound care with patient AT LENGTH. Wounds cleaned thoroughly by nursing staff and new dressings applied. Return precautions discussed. PCP follow up for recheck encouraged. All questions answered.   Final Clinical Impressions(s) / ED Diagnoses   Final diagnoses:  Visit for suture removal    ED Discharge Orders        Ordered    sulfamethoxazole-trimethoprim (BACTRIM DS,SEPTRA DS) 800-160 MG tablet  2 times daily     06/18/17 1617       Ward,  Chase Picket, PA-C 06/18/17 1646    Abelino Derrick, MD 06/20/17 [phone]

## 2017-06-18 NOTE — ED Triage Notes (Signed)
Patient states that she needs to get [facility]

## 2017-08-18 ENCOUNTER — Emergency Department (HOSPITAL_COMMUNITY)
Admission: EM | Admit: 2017-08-18 | Discharge: 2017-08-19 | Disposition: A | Discharge status: Home / Self Care | Payer: Self-pay | Attending: Emergency Medicine | Admitting: Emergency Medicine

## 2017-08-18 ENCOUNTER — Encounter (HOSPITAL_COMMUNITY): Payer: Self-pay | Admitting: Emergency Medicine

## 2017-08-18 ENCOUNTER — Other Ambulatory Visit: Payer: Self-pay

## 2017-08-18 DIAGNOSIS — F251 Schizoaffective disorder, depressive type: Secondary | ICD-10-CM | POA: Insufficient documentation

## 2017-08-18 DIAGNOSIS — F17210 Nicotine dependence, cigarettes, uncomplicated: Secondary | ICD-10-CM | POA: Insufficient documentation

## 2017-08-18 DIAGNOSIS — R45851 Suicidal ideations: Secondary | ICD-10-CM

## 2017-08-18 DIAGNOSIS — Z79899 Other long term (current) drug therapy: Secondary | ICD-10-CM | POA: Insufficient documentation

## 2017-08-18 HISTORY — DX: Other stimulant abuse, uncomplicated: F15.10

## 2017-08-18 HISTORY — DX: Schizoaffective disorder, bipolar type: F25.0

## 2017-08-18 HISTORY — DX: Schizoaffective disorder, unspecified: F25.9

## 2017-08-18 HISTORY — DX: Cannabis abuse, uncomplicated: F12.10

## 2017-08-18 LAB — COMPREHENSIVE METABOLIC PANEL
ALT: 10 U/L — ABNORMAL LOW (ref 14–54)
AST: 17 U/L (ref 15–41)
Albumin: 3.7 g/dL (ref 3.5–5.0)
Alkaline Phosphatase: 45 U/L (ref 38–126)
Anion gap: 7 (ref 5–15)
BILIRUBIN TOTAL: 0.6 mg/dL (ref 0.3–1.2)
CO2: 24 mmol/L (ref 22–32)
Calcium: 8.8 mg/dL — ABNORMAL LOW (ref 8.9–10.3)
Chloride: 105 mmol/L (ref 101–111)
Creatinine, Ser: 0.72 mg/dL (ref 0.44–1.00)
GFR calc Af Amer: >60 mL/min (ref >60)
GLUCOSE: 92 mg/dL (ref 65–99)
POTASSIUM: 3.7 mmol/L (ref 3.5–5.1)
Sodium: 136 mmol/L (ref 135–145)
Total Protein: 6.4 g/dL — ABNORMAL LOW (ref 6.5–8.1)

## 2017-08-18 LAB — I-STAT BETA HCG BLOOD, ED (MC, WL, AP ONLY)

## 2017-08-18 LAB — SALICYLATE LEVEL: Salicylate Lvl: <7 mg/dL (ref 2.8–30.0)

## 2017-08-18 LAB — CBC
HEMATOCRIT: 37.8 % (ref 36.0–46.0)
Hemoglobin: 12.9 g/dL (ref 12.0–15.0)
MCH: 33.3 pg (ref 26.0–34.0)
MCHC: 34.1 g/dL (ref 30.0–36.0)
MCV: 97.7 fL (ref 78.0–100.0)
PLATELETS: 255 10*3/uL (ref 150–400)
RBC: 3.87 MIL/uL (ref 3.87–5.11)
RDW: 12.8 % (ref 11.5–15.5)
WBC: 9.6 10*3/uL (ref 4.0–10.5)

## 2017-08-18 LAB — ETHANOL

## 2017-08-18 LAB — RAPID URINE DRUG SCREEN, HOSP PERFORMED
Amphetamines: POSITIVE — AB
BARBITURATES: NOT DETECTED
Benzodiazepines: NOT DETECTED
Cocaine: NOT DETECTED
Opiates: NOT DETECTED
Tetrahydrocannabinol: POSITIVE — AB

## 2017-08-18 LAB — ACETAMINOPHEN LEVEL: Acetaminophen (Tylenol), Serum: <10 ug/mL — ABNORMAL LOW (ref 10–30)

## 2017-08-18 NOTE — ED Provider Notes (Signed)
[facility] EMERGENCY DEPARTMENT Provider Note   CSN: [id number] Arrival date & time: 08/18/17  2122     History   Chief Complaint Chief Complaint  Patient presents with  . Suicidal    HPI Amy [name] is a 22 y.o. female.  23 year old female with a history of depression and schizoaffective schizophrenia presents to the emergency department for worsening depression.  She states that she feels depressed when she lives in her home environment.  She was seen at [facility] behavioral health for 3 days.  She was discharged on Wednesday.  She does not feel as though this behavioral health stay helped her.  She is followed by an act team, but did not contact them prior to arrival.  She states that they are only able to help her on the weekdays.  She reports feeling suicidal earlier today and took 2 tablets of diet pills with suicidal intent; however, verbalizes that she knew that this would not harm her.  No homicidal ideations, alcohol or illicit drug use expressed.     Past Medical History:  Diagnosis Date  . Depression   . Schizo affective schizophrenia [facility])     Patient Active Problem List   Diagnosis Date Noted  . Cocaine abuse with cocaine-induced mood disorder (HCC) 12/16/2016  . Bipolar disorder, curr episode mixed, severe, with psychotic features (HCC) 10/25/2016  . Cocaine use disorder, mild, abuse (HCC) 10/25/2016  . Cannabis use disorder, severe, dependence (HCC) 10/25/2016  . Insomnia   . Anxiety state   . Cocaine abuse (HCC) 07/04/2015  . Cannabis abuse, continuous 07/02/2015  . Non compliance with medical treatment 06/18/2015  . Bipolar affective disorder (HCC) 03/02/2015    History reviewed. No pertinent surgical history.   OB History   None      Home Medications    Prior to Admission medications   Medication Sig Start Date End Date Taking? Authorizing Provider  ARIPiprazole ER 400 MG SRER Inject 400 mg into the muscle every 28  (twenty-eight) days. (Due 11-28-16): For mood control 11/28/16   Armandina Stammer I, NP  haloperidol (HALDOL) 20 MG tablet Take 20 mg by mouth once.    [provider]  lithium carbonate 300 MG capsule Take 3 capsules (900 mg total) by mouth at bedtime. For mood stabilization 11/01/16   Armandina Stammer I, NP  traZODone (DESYREL) 150 MG tablet Take 1 tablet (150 mg total) by mouth at bedtime. For sleep Patient not taking: Reported on 12/16/2016 11/01/16   Sanjuana Kava, NP    Family History Family History  Problem Relation Age of Onset  . Mental illness Neg Hx     Social History Social History   Tobacco Use  . Smoking status: Current Every Day Smoker    Types: Cigarettes  . Smokeless tobacco: Never Used  Substance Use Topics  . Alcohol use: No  . Drug use: Yes    Types: Marijuana    Comment: denies     Allergies   Cogentin [benztropine]; Depakote [divalproex sodium]; Valproic acid; and Penicillins   Review of Systems Review of Systems Ten systems reviewed and are negative for acute change, except as noted in the HPI.    Physical Exam Updated Vital Signs BP 125/84 (BP Location: Right Arm)   Pulse 60   Temp 97.6 F (36.4 C) (Oral)   Resp 16   Ht 6\' 1"  (1.854 m)   Wt 86.2 kg (190 lb)   LMP 07/20/2017 (Approximate)  SpO2 100%   BMI 25.07 kg/m   Physical Exam  Constitutional: She is oriented to person, place, and time. She appears well-developed and well-nourished. No distress.  Calm and cooperative, nontoxic  HENT:  Head: Normocephalic and atraumatic.  Eyes: Conjunctivae and EOM are normal. No scleral icterus.  Neck: Normal range of motion.  Pulmonary/Chest: Effort normal. No stridor. No respiratory distress.  Respirations even and unlabored  Musculoskeletal: Normal range of motion.  Neurological: She is alert and oriented to person, place, and time. She exhibits normal muscle tone. Coordination normal.  Skin: Skin is warm and dry. No rash noted. She is not  diaphoretic. No erythema. No pallor.  Psychiatric: Her speech is normal and behavior is normal. She exhibits a depressed mood. She expresses suicidal ideation.  Reports suicidal ideations no clear plan besides taking 2 tablets of her diet pills earlier today  Nursing note and vitals reviewed.    ED Treatments / Results  Labs (all labs ordered are listed, but only abnormal results are displayed) Labs Reviewed  COMPREHENSIVE METABOLIC PANEL - Abnormal; Notable for the following components:      Result Value   BUN <5 (*)    Calcium 8.8 (*)    Total Protein 6.4 (*)    ALT 10 (*)    All other components within normal limits  ACETAMINOPHEN LEVEL - Abnormal; Notable for the following components:   Acetaminophen (Tylenol), Serum <10 (*)    All other components within normal limits  RAPID URINE DRUG SCREEN, HOSP PERFORMED - Abnormal; Notable for the following components:   Amphetamines POSITIVE (*)    Tetrahydrocannabinol POSITIVE (*)    All other components within normal limits  ETHANOL  SALICYLATE LEVEL  CBC  I-STAT BETA HCG BLOOD, ED ([facility], [facility], [facility] ONLY)    EKG None  Radiology No results found.  Procedures Procedures (including critical care time)  Medications Ordered in ED Medications - No data to display   Initial Impression / Assessment and Plan / ED Course  I have reviewed the triage vital signs and the nursing notes.  Pertinent labs & imaging results that were available during my care of the patient were reviewed by me and considered in my medical decision making (see chart for details).     22 year old female presents to the emergency department for psychiatric evaluation.  She reports depression which has been worsening since most recent hospital discharge for similar complaints.  Patient medically cleared and pending TTS evaluation.  Disposition to be determined by oncoming ED provider.   Final Clinical Impressions(s) / ED Diagnoses   Final diagnoses:    Depression, unspecified depression type    ED Discharge Orders    None       Antony MaduraHumes, Rashmi Tallent, PA-C 08/19/17 [phone]    Shaune PollackIsaacs, Cameron, MD 08/19/17 1123

## 2017-08-18 NOTE — ED Notes (Signed)
Staffing called for sitter.   

## 2017-08-18 NOTE — ED Notes (Signed)
Pt placed in wine scrubs, security called to wand pt

## 2017-08-18 NOTE — ED Triage Notes (Signed)
Pt to ED with c/o being suicidal.  Pt was just released from [facility] days ago for same.  Pt st's she is feeling sleepy and tired because she took 2 of her dads pills.  Doesn't know the name of them but st's they are like Xanax.

## 2017-08-18 NOTE — BH Assessment (Signed)
Counselor attempted TTS consult, pt is in the hallway, RN attempting to find a room

## 2017-08-18 NOTE — ED Notes (Addendum)
Pt wanded by security.  All belongings removed and placed at nurses station

## 2017-08-19 ENCOUNTER — Encounter (HOSPITAL_COMMUNITY): Payer: Self-pay | Admitting: *Deleted

## 2017-08-19 ENCOUNTER — Other Ambulatory Visit: Payer: Self-pay

## 2017-08-19 DIAGNOSIS — Z915 Personal history of self-harm: Secondary | ICD-10-CM

## 2017-08-19 DIAGNOSIS — Z560 Unemployment, unspecified: Secondary | ICD-10-CM

## 2017-08-19 DIAGNOSIS — F259 Schizoaffective disorder, unspecified: Secondary | ICD-10-CM

## 2017-08-19 DIAGNOSIS — F1290 Cannabis use, unspecified, uncomplicated: Secondary | ICD-10-CM

## 2017-08-19 DIAGNOSIS — Z62820 Parent-biological child conflict: Secondary | ICD-10-CM

## 2017-08-19 DIAGNOSIS — F17210 Nicotine dependence, cigarettes, uncomplicated: Secondary | ICD-10-CM

## 2017-08-19 NOTE — Consult Note (Signed)
Telepsych Consultation   Reason for Consult: Depressive episode Referring Physician: PA Antonietta Breach Location of Patient: [facility] ED Location of Provider: [facility]  Patient Identification: Amy [name] MRN:  [id number] Principal Diagnosis: <principal problem not specified> Diagnosis:   Patient Active Problem List   Diagnosis Date Noted  . Cocaine abuse with cocaine-induced mood disorder ([place]) [F14.14] 12/16/2016  . Bipolar disorder, curr episode mixed, severe, with psychotic features ([place]) [F31.64] 10/25/2016  . Cocaine use disorder, mild, abuse ([place]) [F14.10] 10/25/2016  . Cannabis use disorder, severe, dependence ([place]) [F12.20] 10/25/2016  . Insomnia [G47.00]   . Anxiety state [F41.1]   . Cocaine abuse ([place]) [F14.10] 07/04/2015  . Cannabis abuse, continuous [F12.10] 07/02/2015  . Non compliance with medical treatment [Z91.19] 06/18/2015  . Bipolar affective disorder ([place]) [F31.9] 03/02/2015    Total Time spent with patient: 30 minutes  Subjective:   Amy [name] is a 22 y.o. female patient admitted with Schizoaffective disorder, Depressive type.  HPI: Per the TTS assessment completed on 08/19/17 by Virgina Organ: Amy [name] is an 22 y.o. female.  The pt came in after telling her mother, "I don't want to be here anymore".  The pt then took 2 pills that she thinks were Zoloft.  She said she would have taken the entire bottle if her parents were not around.  She stated she is depressed due to not having a job and her mother yelling at her all of the time.  The pt reports she is still suicidal with a plan to overdose.  The pt recently got out of [facility]  08/15/17 and she reported she has been hospitalized about 15 times in her life time.  She is currently receiving services from [facility].  The pt reported she is not sleeping well and she is not eating well.  She also complained of feeling down and depressed, problems getting out of bed to  do pleasurable activities, and feeling irritable for no reason.  The pt UDS is positive for amphetamine and marijuana.  The pt denies using crystal meth since summer 2018.  She also denies using any amphetamine type medication.  Her records from [facility] also report the usage of crystal meth.  She reports she uses marijuana 2-3 times a week with her last usage 08/17/17.    The pt denies current or previous psychosis, but previous records from [facility] report the pt was seeing aliens last week.  The pt is currently taking haldol.  The pt denies HI.   On Exam: Patient was seen via tele-psych, chart reviewed with treatment team. Patient in bed, awake, alert and oriented x4. Patient reiterated the reason for this hospital admission as documented above. Patient stated, "I came to the hospital because I was feeling depressed and sad". Patient stated that she has not been getting good sleep for some days now. She said she tusses and turns at night and sleeps only about 4 hours. Patient stating that she is not really suicidal but that she didn't know what else to tell the person that assessed her so she told her that she was suicidal. Patient stated that she will be safe to go home and that she has appointment tomorrow at 11am with her ACT team member. She reports that she will be okay if she can just get good sleep. She admits to using drugs as she tested positive for Amphetamines and THC but she states that she has  decided to quit. Patient agrees to try OTC sleep aids such Benadryl or Melatonin, patient does not appear to be responding to internal stimuli during this encounter, she contracts for safety.   Past Psychiatric History: As in H&P  Risk to Self: Suicidal Ideation: Yes-Currently Present Suicidal Intent: Yes-Currently Present Is patient at risk for suicide?: Yes Suicidal Plan?: Yes-Currently Present Specify Current Suicidal Plan: overdose on pills Access to Means:  Yes Specify Access to Suicidal Means: can get pills What has been your use of drugs/alcohol within the last 12 months?: marijuana and amphetamine use How many times?: 10 Other Self Harm Risks: none reported Triggers for Past Attempts: Unpredictable Intentional Self Injurious Behavior: None Risk to Others: Homicidal Ideation: No Thoughts of Harm to Others: No Current Homicidal Intent: No Current Homicidal Plan: No Access to Homicidal Means: No Identified Victim: none History of harm to others?: No Assessment of Violence: None Noted Violent Behavior Description: none Does patient have access to weapons?: No Criminal Charges Pending?: No Does patient have a court date: No Prior Inpatient Therapy: Prior Inpatient Therapy: Yes Prior Therapy Dates: 07/2017 and 14 other times Prior Therapy Facilty/Provider(s): [facility], [place] [facility]  and others Reason for Treatment: psychosis, depressions and suicide attempts Prior Outpatient Therapy: Prior Outpatient Therapy: Yes Prior Therapy Dates: current Prior Therapy Facilty/Provider(s): [facility] Reason for Treatment: depression Does patient have an ACCT team?: Yes Does patient have Intensive In-House Services?  : No Does patient have [facility] services? : No Does patient have P4CC services?: No  Past Medical History:  Past Medical History:  Diagnosis Date  . Cannabis abuse   . Depression   . Methamphetamine abuse ([place])   . Schizo affective schizophrenia ([place])    History reviewed. No pertinent surgical history. Family History:  Family History  Problem Relation Age of Onset  . Mental illness Neg Hx    Family Psychiatric  History: Unknown Social History:  Social History   Substance and Sexual Activity  Alcohol Use No     Social History   Substance and Sexual Activity  Drug Use Yes  . Types: Marijuana   Comment: denies    Social History   Socioeconomic History  . Marital status: Single    Spouse name: Not on file  .  Number of children: Not on file  . Years of education: Not on file  . Highest education level: Not on file  Occupational History  . Not on file  Social Needs  . Financial resource strain: Not on file  . Food insecurity:    Worry: Not on file    Inability: Not on file  . Transportation needs:    Medical: Not on file    Non-medical: Not on file  Tobacco Use  . Smoking status: Current Every Day Smoker    Types: Cigarettes  . Smokeless tobacco: Never Used  Substance and Sexual Activity  . Alcohol use: No  . Drug use: Yes    Types: Marijuana    Comment: denies  . Sexual activity: Not on file  Lifestyle  . Physical activity:    Days per week: Not on file    Minutes per session: Not on file  . Stress: Not on file  Relationships  . Social connections:    Talks on phone: Not on file    Gets together: Not on file    Attends religious service: Not on file    Active member of club or organization: Not on file  Attends meetings of clubs or organizations: Not on file    Relationship status: Not on file  Other Topics Concern  . Not on file  Social History Narrative  . Not on file   Additional Social History:    Allergies:   Allergies  Allergen Reactions  . Cogentin [Benztropine]   . Depakote [Divalproex Sodium] Other (See Comments)    Creates feelings of paranoia, some suicidal feelings, and makes her feel that "people are coming after" her  . Risperidone Other (See Comments)    "Makes me cough"  . Valproic Acid Other (See Comments)    Creates paranoia/Per [facility]  . Penicillins Rash    Has patient had a PCN reaction causing immediate rash, facial/tongue/throat swelling, SOB or lightheadedness with hypotension: Yes Has patient had a PCN reaction causing severe rash involving mucus membranes or skin necrosis: No Has patient had a PCN reaction that required hospitalization: No Has patient had a PCN reaction occurring within the last 10 years: Yes If all of the  above answers are "NO", then may     Labs:  Results for orders placed or performed during the hospital encounter of 08/18/17 (from the past 48 hour(s))  Rapid urine drug screen (hospital performed)     Status: Abnormal   Collection Time: 08/18/17  9:42 PM  Result Value Ref Range   Opiates NONE DETECTED NONE DETECTED   Cocaine NONE DETECTED NONE DETECTED   Benzodiazepines NONE DETECTED NONE DETECTED   Amphetamines POSITIVE (A) NONE DETECTED   Tetrahydrocannabinol POSITIVE (A) NONE DETECTED   Barbiturates NONE DETECTED NONE DETECTED    Comment: (NOTE) DRUG SCREEN FOR MEDICAL PURPOSES ONLY.  IF CONFIRMATION IS NEEDED FOR ANY PURPOSE, NOTIFY LAB WITHIN 5 DAYS. LOWEST DETECTABLE LIMITS FOR URINE DRUG SCREEN Drug Class                     Cutoff (ng/mL) Amphetamine and metabolites    1000 Barbiturate and metabolites    200 Benzodiazepine                 585 Tricyclics and metabolites     300 Opiates and metabolites        300 Cocaine and metabolites        300 THC                            50 Performed at [facility], [place] [address], [place], [place] [address]   Comprehensive metabolic panel     Status: Abnormal   Collection Time: 08/18/17  9:52 PM  Result Value Ref Range   Sodium 136 135 - 145 mmol/L   Potassium 3.7 3.5 - 5.1 mmol/L   Chloride 105 101 - 111 mmol/L   CO2 24 22 - 32 mmol/L   Glucose, Bld 92 65 - 99 mg/dL   BUN <5 (L) 6 - 20 mg/dL   Creatinine, Ser 0.72 0.44 - 1.00 mg/dL   Calcium 8.8 (L) 8.9 - 10.3 mg/dL   Total Protein 6.4 (L) 6.5 - 8.1 g/dL   Albumin 3.7 3.5 - 5.0 g/dL   AST 17 15 - 41 U/L   ALT 10 (L) 14 - 54 U/L   Alkaline Phosphatase 45 38 - 126 U/L   Total Bilirubin 0.6 0.3 - 1.2 mg/dL   GFR calc non Af Amer >60 >60 mL/min   GFR calc Af Amer >60 >60 mL/min  Comment: (NOTE) The eGFR has been calculated using the CKD EPI equation. This calculation has not been validated in all clinical situations. eGFR's persistently <60 mL/min signify  possible Chronic Kidney Disease.    Anion gap 7 5 - 15    Comment: Performed at [facility] [address] [place], [place] [address]  Ethanol     Status: None   Collection Time: 08/18/17  9:52 PM  Result Value Ref Range   Alcohol, Ethyl (B) <10 <10 mg/dL    Comment:        LOWEST DETECTABLE LIMIT FOR SERUM ALCOHOL IS 10 mg/dL FOR MEDICAL PURPOSES ONLY Performed at [facility], [place] [address], [place], [place] [address]   Salicylate level     Status: None   Collection Time: 08/18/17  9:52 PM  Result Value Ref Range   Salicylate Lvl <8.1 2.8 - 30.0 mg/dL    Comment: Performed at [facility] [address], [place], [place] [address]  Acetaminophen level     Status: Abnormal   Collection Time: 08/18/17  9:52 PM  Result Value Ref Range   Acetaminophen (Tylenol), Serum <10 (L) 10 - 30 ug/mL    Comment:        THERAPEUTIC CONCENTRATIONS VARY SIGNIFICANTLY. A RANGE OF 10-30 ug/mL MAY BE AN EFFECTIVE CONCENTRATION FOR MANY PATIENTS. HOWEVER, SOME ARE BEST TREATED AT CONCENTRATIONS OUTSIDE THIS RANGE. ACETAMINOPHEN CONCENTRATIONS >150 ug/mL AT 4 HOURS AFTER INGESTION AND >50 ug/mL AT 12 HOURS AFTER INGESTION ARE OFTEN ASSOCIATED WITH TOXIC REACTIONS. Performed at [facility], [place] [address] [place], [place] [address]   cbc     Status: None   Collection Time: 08/18/17  9:52 PM  Result Value Ref Range   WBC 9.6 4.0 - 10.5 K/uL   RBC 3.87 3.87 - 5.11 MIL/uL   Hemoglobin 12.9 12.0 - 15.0 g/dL   HCT 37.8 36.0 - 46.0 %   MCV 97.7 78.0 - 100.0 fL   MCH 33.3 26.0 - 34.0 pg   MCHC 34.1 30.0 - 36.0 g/dL   RDW 12.8 11.5 - 15.5 %   Platelets 255 150 - 400 K/uL    Comment: Performed at [facility], [place] [address], [place], [place] [address]  I-Stat beta hCG blood, ED     Status: None   Collection Time: 08/18/17 10:07 PM  Result Value Ref Range   I-stat hCG, quantitative <5.0 <5 mIU/mL   Comment 3            Comment:   GEST. AGE      CONC.   (mIU/mL)   <=1 WEEK        5 - 50     2 WEEKS       50 - 500     3 WEEKS       100 - 10,000     4 WEEKS     1,000 - 30,000        FEMALE AND NON-PREGNANT FEMALE:     LESS THAN 5 mIU/mL     Medications:  No current facility-administered medications for this encounter.    Current Outpatient Medications  Medication Sig Dispense Refill  . ARIPiprazole ER 400 MG SRER Inject 400 mg into the muscle every 28 (twenty-eight) days. (Due 11-28-16): For mood control 1 each 0  . haloperidol (HALDOL) 20 MG tablet Take 20 mg by mouth once.    . lithium carbonate 300 MG capsule Take  3 capsules (900 mg total) by mouth at bedtime. For mood stabilization (Patient not taking: Reported on 08/19/2017) 90 capsule 0  . traZODone (DESYREL) 150 MG tablet Take 1 tablet (150 mg total) by mouth at bedtime. For sleep (Patient not taking: Reported on 12/16/2016) 30 tablet 0    Musculoskeletal: UTA via camera  Psychiatric Specialty Exam: Physical Exam  Nursing note and vitals reviewed.   Review of Systems  Psychiatric/Behavioral: Positive for depression and substance abuse. Negative for hallucinations and suicidal ideas. The patient is not nervous/anxious.     Blood pressure (!) 114/59, pulse (!) 51, temperature 98.8 F (37.1 C), temperature source Oral, resp. rate 18, height '6\' 1"'  (1.854 m), weight 86.2 kg (190 lb), last menstrual period 07/20/2017, SpO2 99 %.Body mass index is 25.07 kg/m.  General Appearance: on hospital scrub  Eye Contact:  Good  Speech:  Clear and Coherent and Normal Rate  Volume:  Normal  Mood:  Euthymic  Affect:  Appropriate, Congruent and Tearful  Thought Process:  Coherent and Goal Directed  Orientation:  Full (Time, Place, and Person)  Thought Content:  WDL and Logical  Suicidal Thoughts:  No  Homicidal Thoughts:  No  Memory:  Immediate;   Good Recent;   Good Remote;   Fair  Judgement:  Intact  Insight:  Good and Present  Psychomotor Activity:  Normal  Concentration:   Concentration: Good and Attention Span: Good  Recall:  Good  Fund of Knowledge:  Good  Language:  Good  Akathisia:  Negative  Handed:  Right  AIMS (if indicated):     Assets:  Communication Skills Desire for Improvement Financial Resources/Insurance Housing Physical Health Resilience Social Support  ADL's:  Intact  Cognition:  WNL  Sleep:       Treatment Plan recommendations as discussed and agreed with Dr. Dwyane Dee:   Treatment Plan Summary: Plan to discharge patient home  -Follow up with your scheduled appointment tomorrow at 1100 with your ACT Team. Follow up with [facility] for therapy and medication management Follow up with Social Work consult for Care coordination -Take OTC Benadryl or Melatonin for sleep Take all medications as prescribed Avoid the use of alcohol and/or drugs Stay well hydrated Activity as tolerated Follow up with PCP for any new or existing medical concerns  Disposition: No evidence of imminent risk to self or others at present.   Patient does not meet criteria for psychiatric inpatient admission. Supportive therapy provided about ongoing stressors. Refer to IOP. Discussed crisis plan, support from social network, calling 911, coming to the Emergency Department, and calling Suicide Hotline.  This service was provided via telemedicine using a 2-way, interactive audio and video technology.  Names of all persons participating in this telemedicine service and their role in this encounter. Name: Amy [name] Role: Patient  Name: Justina A. Lu Duffel  Role: NP           Vicenta Aly, NP 08/19/2017 2:44 PM

## 2017-08-19 NOTE — ED Notes (Signed)
[facility] recommending inpt.

## 2017-08-19 NOTE — ED Notes (Signed)
Pt's mother arrived and picked up pt.

## 2017-08-19 NOTE — ED Notes (Signed)
Pt called her mother again and advised her mother should be arriving in approx 30 min.

## 2017-08-19 NOTE — BH Assessment (Addendum)
Tele Assessment Note   Patient Name: Amy [name] MRN: [id number] Referring Physician: PA Antony Madura Location of Patient: [facility] ED Location of Provider: Behavioral Health TTS Department  Amy [name] is an 22 y.o. female.  The pt came in after telling her mother, "I don't want to be here anymore".  The pt then took 2 pills that she thinks were Zoloft.  She said she would have taken the entire bottle if her parents were not around.  She stated she is depressed due to not having a job and her mother yelling at her all of the time.  The pt reports she is still suicidal with a plan to overdose.  The pt recently got out of [facility]  08/15/17 and she reported she has been hospitalized about 15 times in her life time.  She is currently receiving services from [facility].  The pt reported she is not sleeping well and she is not eating well.  She also complained of feeling down and depressed, problems getting out of bed to do pleasurable activities, and feeling irritable for no reason.  The pt UDS is positive for amphetamine and marijuana.  The pt denies using crystal meth since summer 2018.  She also denies using any amphetamine type medication.  Her records from [facility] also report the usage of crystal meth.  She reports she uses marijuana 2-3 times a week with her last usage 08/17/17.    The pt denies current or previous psychosis, but previous records from [facility] report the pt was seeing aliens last week.  The pt is currently taking haldol.  The pt denies HI.  Diagnosis: F25.1 Schizoaffective disorder, Depressive type   Past Medical History:  Past Medical History:  Diagnosis Date  . Depression   . Schizo affective schizophrenia (HCC)     History reviewed. No pertinent surgical history.  Family History:  Family History  Problem Relation Age of Onset  . Mental illness Neg Hx     Social History:  reports that she has been smoking  cigarettes.  She has never used smokeless tobacco. She reports that she has current or past drug history. Drug: Marijuana. She reports that she does not drink alcohol.  Additional Social History:  Alcohol / Drug Use Pain Medications: See MAR Prescriptions: See MAR Over the Counter: See MAR History of alcohol / drug use?: Yes Longest period of sobriety (when/how long): unknown Substance #1 Name of Substance 1: Marijuana 1 - Age of First Use: 14 1 - Amount (size/oz): "one blunt" pt wasn't sure of the exact amount 1 - Frequency: 2-3 times a day 1 - Duration: 7 years 1 - Last Use / Amount: 08/17/17 Substance #2 Name of Substance 2: Crystal Meth 2 - Age of First Use: 20 2 - Amount (size/oz): unknown 2 - Frequency: pt denied usage since summer 2018 2 - Duration: one year 2 - Last Use / Amount: pt denied usage since Summer 2018, but pt UDS was postive for amphetamine.  CIWA: CIWA-Ar BP: (!) 112/55 Pulse Rate: (!) 54 COWS:    Allergies:  Allergies  Allergen Reactions  . Cogentin [Benztropine]   . Depakote [Divalproex Sodium] Other (See Comments)    Creates feelings of paranoia, some suicidal feelings, and makes her feel that "people are coming after" her  . Valproic Acid Other (See Comments)    Creates paranoia/[facility]  . Penicillins Rash    Has patient had a  PCN reaction causing immediate rash, facial/tongue/throat swelling, SOB or lightheadedness with hypotension: Yes Has patient had a PCN reaction causing severe rash involving mucus membranes or skin necrosis: No Has patient had a PCN reaction that required hospitalization: No Has patient had a PCN reaction occurring within the last 10 years: Yes If all of the above answers are "NO", then may     Home Medications:  (Not in a hospital admission)  OB/GYN Status:  Patient's last menstrual period was 07/20/2017 (approximate).  General Assessment Data Assessment unable to be completed: Yes Reason for not completing  assessment: Pt in hallway Location of Assessment: [facility] ED TTS Assessment: In system Is this a Tele or Face-to-Face Assessment?: Tele Assessment Is this an Initial Assessment or a Re-assessment for this encounter?: Initial Assessment Marital status: Single Maiden name: Samarin Is patient pregnant?: No Pregnancy Status: No Living Arrangements: Parent Can pt return to current living arrangement?: Yes Admission Status: Voluntary Is patient capable of signing voluntary admission?: Yes Referral Source: Self/Family/Friend Insurance type: Self Pay     Crisis Care Plan Living Arrangements: Parent Legal Guardian: Other:(Self) Name of Psychiatrist: [facility] Name of Therapist: [facility]  Education Status Is patient currently in school?: No Is the patient employed, unemployed or receiving disability?: Unemployed  Risk to self with the past 6 months Suicidal Ideation: Yes-Currently Present Has patient been a risk to self within the past 6 months prior to admission? : Yes Suicidal Intent: Yes-Currently Present Has patient had any suicidal intent within the past 6 months prior to admission? : Yes Is patient at risk for suicide?: Yes Suicidal Plan?: Yes-Currently Present Has patient had any suicidal plan within the past 6 months prior to admission? : Yes Specify Current Suicidal Plan: overdose on pills Access to Means: Yes Specify Access to Suicidal Means: can get pills What has been your use of drugs/alcohol within the last 12 months?: marijuana and amphetamine use Previous Attempts/Gestures: Yes How many times?: 10 Other Self Harm Risks: none reported Triggers for Past Attempts: Unpredictable Intentional Self Injurious Behavior: None Family Suicide History: No Recent stressful life event(s): Other (Comment), Conflict (Comment)(lack of job "mom yelling") Persecutory voices/beliefs?: No Depression: Yes Depression Symptoms: Insomnia, Loss of interest in usual  pleasures, Feeling worthless/self pity, Feeling angry/irritable Substance abuse history and/or treatment for substance abuse?: No Suicide prevention information given to non-admitted patients: Not applicable  Risk to Others within the past 6 months Homicidal Ideation: No Does patient have any lifetime risk of violence toward others beyond the six months prior to admission? : No Thoughts of Harm to Others: No Current Homicidal Intent: No Current Homicidal Plan: No Access to Homicidal Means: No Identified Victim: none History of harm to others?: No Assessment of Violence: None Noted Violent Behavior Description: none Does patient have access to weapons?: No Criminal Charges Pending?: No Does patient have a court date: No Is patient on probation?: No  Psychosis Hallucinations: None noted Delusions: None noted  Mental Status Report Appearance/Hygiene: In scrubs, Unremarkable Eye Contact: Fair Motor Activity: Freedom of movement, Unremarkable Speech: Logical/coherent Level of Consciousness: Alert Mood: Pleasant Affect: Appropriate to circumstance Anxiety Level: None Thought Processes: Coherent, Relevant Judgement: Impaired Orientation: Person, Place, Situation, Time, Appropriate for developmental age Obsessive Compulsive Thoughts/Behaviors: None  Cognitive Functioning Concentration: Normal Memory: Recent Intact, Remote Intact Is patient IDD: No Is patient DD?: No Insight: Poor Impulse Control: Poor Appetite: Fair Have you had any weight changes? : No Change Sleep: Decreased Total Hours of  Sleep: 4 Vegetative Symptoms: None  ADLScreening [facility] Assessment Services) Patient's cognitive ability adequate to safely complete daily activities?: Yes Patient able to express need for assistance with ADLs?: Yes Independently performs ADLs?: Yes (appropriate for developmental age)  Prior Inpatient Therapy Prior Inpatient Therapy: Yes Prior Therapy Dates: 07/2017 and 14 other  times Prior Therapy Facilty/Provider(s): [facility], [place] [facility]  and others Reason for Treatment: psychosis, depressions and suicide attempts  Prior Outpatient Therapy Prior Outpatient Therapy: Yes Prior Therapy Dates: current Prior Therapy Facilty/Provider(s): [facility] Reason for Treatment: depression Does patient have an ACCT team?: Yes Does patient have Intensive In-House Services?  : No Does patient have [facility] services? : No Does patient have P4CC services?: No  ADL Screening (condition at time of admission) Patient's cognitive ability adequate to safely complete daily activities?: Yes Patient able to express need for assistance with ADLs?: Yes Independently performs ADLs?: Yes (appropriate for developmental age)       Abuse/Neglect Assessment (Assessment to be complete while patient is alone) Abuse/Neglect Assessment Can Be Completed: Yes Physical Abuse: Denies Verbal Abuse: Denies Sexual Abuse: Denies Exploitation of patient/patient's resources: Denies Self-Neglect: Denies Values / Beliefs Cultural Requests During Hospitalization: None Spiritual Requests During Hospitalization: None Consults Spiritual Care Consult Needed: No Social Work Consult Needed: No            Disposition:  Disposition Initial Assessment Completed for this Encounter: Yes Patient referred to: Other (Comment)   Leighton Ruffina Okonkwo NP recommends inpatient treatment.  RN Chari ManningBecca was notified of the recommendations.  This service was provided via telemedicine using a 2-way, interactive audio and video technology.  Names of all persons participating in this telemedicine service and their role in this encounter. Name: Johnnette Barriosdantre Falwell Role: Pt  Name: Riley ChurchesKendall Jaaziah Schulke Role: TTS  Name:  Role:   Name:  Role:     Ottis StainGarvin, Samatha Anspach Jermaine 08/19/2017 7:55 AM

## 2017-08-19 NOTE — ED Notes (Addendum)
Pt ambulatory to F8 wearing burgundy scrubs. Sitter w/pt. Pt's labeled belongings bag x 1 placed at nurses' desk for inventory.

## 2017-08-19 NOTE — Care Management (Addendum)
Writer informed the ACTT Team that the patient will be Therapist, nutritionaldischarging   Writer spoke to BrogdenJohn from [facility] and he reports that the patient is not at base line and he feels that she continues to meet criteria for inpatient hospitalization.      Patient was faxed to several facilities for inpatient psychiatric hospitaization

## 2017-08-19 NOTE — ED Notes (Signed)
Per Ava, pt's ACTT to come evaluate pt - to assess if she is at baseline.

## 2017-08-19 NOTE — BH Assessment (Signed)
Writer spoke to the [facility] Jasmine December(Sharon [phone]).  The ACCT Team will be coming to assess the patient.    Writer [facility] and the ER RN working with the patient.

## 2017-08-19 NOTE — Care Management (Signed)
Writer fax[facility] the following facilities:  [facility]  [facility]  [facility]  [facility]  [facility]

## 2017-08-19 NOTE — ED Notes (Signed)
Breakfast tray ordered 

## 2017-08-19 NOTE — ED Notes (Addendum)
Pt voiced understanding and agreement w/tx plan - d/c to home. Pt calling her mom and asking her to come pick her up.

## 2017-08-19 NOTE — ED Notes (Signed)
Telepsych being performed. 

## 2017-10-27 ENCOUNTER — Other Ambulatory Visit: Payer: Self-pay

## 2017-10-27 ENCOUNTER — Encounter (HOSPITAL_COMMUNITY): Payer: Self-pay | Admitting: Emergency Medicine

## 2017-10-27 ENCOUNTER — Emergency Department (HOSPITAL_COMMUNITY)
Admission: EM | Admit: 2017-10-27 | Discharge: 2017-10-28 | Disposition: A | Discharge status: Home / Self Care | Payer: Self-pay | Attending: Emergency Medicine | Admitting: Emergency Medicine

## 2017-10-27 DIAGNOSIS — F3170 Bipolar disorder, currently in remission, most recent episode unspecified: Secondary | ICD-10-CM | POA: Insufficient documentation

## 2017-10-27 DIAGNOSIS — Z79899 Other long term (current) drug therapy: Secondary | ICD-10-CM | POA: Insufficient documentation

## 2017-10-27 DIAGNOSIS — F17210 Nicotine dependence, cigarettes, uncomplicated: Secondary | ICD-10-CM | POA: Insufficient documentation

## 2017-10-27 DIAGNOSIS — F1910 Other psychoactive substance abuse, uncomplicated: Secondary | ICD-10-CM

## 2017-10-27 DIAGNOSIS — F12150 Cannabis abuse with psychotic disorder with delusions: Secondary | ICD-10-CM

## 2017-10-27 DIAGNOSIS — F259 Schizoaffective disorder, unspecified: Secondary | ICD-10-CM | POA: Insufficient documentation

## 2017-10-27 LAB — URINALYSIS, ROUTINE W REFLEX MICROSCOPIC
BILIRUBIN URINE: NEGATIVE
GLUCOSE, UA: NEGATIVE mg/dL
HGB URINE DIPSTICK: NEGATIVE
KETONES UR: 20 mg/dL — AB
LEUKOCYTES UA: NEGATIVE
NITRITE: NEGATIVE
PH: 5 (ref 5.0–8.0)
Protein, ur: 100 mg/dL — AB
Specific Gravity, Urine: 1.032 — ABNORMAL HIGH (ref 1.005–1.030)

## 2017-10-27 LAB — CBC WITH DIFFERENTIAL/PLATELET
BASOS ABS: 0 10*3/uL (ref 0.0–0.1)
BASOS PCT: 0 %
Eosinophils Absolute: 0.1 10*3/uL (ref 0.0–0.7)
Eosinophils Relative: 1 %
HEMATOCRIT: 42.2 % (ref 36.0–46.0)
HEMOGLOBIN: 14.6 g/dL (ref 12.0–15.0)
Lymphocytes Relative: 45 %
Lymphs Abs: 3.3 10*3/uL (ref 0.7–4.0)
MCH: 34.4 pg — ABNORMAL HIGH (ref 26.0–34.0)
MCHC: 34.6 g/dL (ref 30.0–36.0)
MCV: 99.5 fL (ref 78.0–100.0)
Monocytes Absolute: 1 10*3/uL (ref 0.1–1.0)
Monocytes Relative: 13 %
NEUTROS ABS: 3 10*3/uL (ref 1.7–7.7)
NEUTROS PCT: 41 %
Platelets: 254 10*3/uL (ref 150–400)
RBC: 4.24 MIL/uL (ref 3.87–5.11)
RDW: 12.9 % (ref 11.5–15.5)
WBC: 7.3 10*3/uL (ref 4.0–10.5)

## 2017-10-27 LAB — RAPID URINE DRUG SCREEN, HOSP PERFORMED
AMPHETAMINES: POSITIVE — AB
Barbiturates: NOT DETECTED
Benzodiazepines: NOT DETECTED
Cocaine: NOT DETECTED
Opiates: NOT DETECTED
Tetrahydrocannabinol: POSITIVE — AB

## 2017-10-27 LAB — COMPREHENSIVE METABOLIC PANEL
ALBUMIN: 4.4 g/dL (ref 3.5–5.0)
ALT: 17 U/L (ref 14–54)
AST: 21 U/L (ref 15–41)
Alkaline Phosphatase: 50 U/L (ref 38–126)
Anion gap: 12 (ref 5–15)
BUN: 16 mg/dL (ref 6–20)
CHLORIDE: 104 mmol/L (ref 101–111)
CO2: 23 mmol/L (ref 22–32)
Calcium: 9.4 mg/dL (ref 8.9–10.3)
Creatinine, Ser: 1.03 mg/dL — ABNORMAL HIGH (ref 0.44–1.00)
GFR calc Af Amer: >60 mL/min (ref >60)
GFR calc non Af Amer: >60 mL/min (ref >60)
GLUCOSE: 76 mg/dL (ref 65–99)
POTASSIUM: 3.8 mmol/L (ref 3.5–5.1)
Sodium: 139 mmol/L (ref 135–145)
Total Bilirubin: 1 mg/dL (ref 0.3–1.2)
Total Protein: 7.6 g/dL (ref 6.5–8.1)

## 2017-10-27 LAB — LITHIUM LEVEL: Lithium Lvl: <0.06 mmol/L — ABNORMAL LOW (ref 0.60–1.20)

## 2017-10-27 LAB — I-STAT BETA HCG BLOOD, ED (MC, WL, AP ONLY)

## 2017-10-27 LAB — PREGNANCY, URINE: Preg Test, Ur: NEGATIVE

## 2017-10-27 LAB — ETHANOL

## 2017-10-27 MED ORDER — HALOPERIDOL 5 MG PO TABS
10.0000 mg | ORAL_TABLET | Freq: Two times a day (BID) | ORAL | Status: DC
  Administered 2017-10-27 – 2017-10-28 (×3): 10 mg via ORAL
  Filled 2017-10-27 (×3): qty 2

## 2017-10-27 MED ORDER — TRAZODONE HCL 100 MG PO TABS
100.0000 mg | ORAL_TABLET | Freq: Every day | ORAL | Status: DC
  Administered 2017-10-27: 100 mg via ORAL
  Filled 2017-10-27: qty 1

## 2017-10-27 MED ORDER — HYDROXYZINE HCL 25 MG PO TABS
25.0000 mg | ORAL_TABLET | Freq: Three times a day (TID) | ORAL | Status: DC
  Administered 2017-10-27 – 2017-10-28 (×3): 25 mg via ORAL
  Filled 2017-10-27 (×3): qty 1

## 2017-10-27 MED ORDER — LITHIUM CARBONATE ER 300 MG PO TBCR
300.0000 mg | EXTENDED_RELEASE_TABLET | Freq: Two times a day (BID) | ORAL | Status: DC
  Administered 2017-10-27 – 2017-10-28 (×3): 300 mg via ORAL
  Filled 2017-10-27 (×3): qty 1

## 2017-10-27 MED ORDER — TRIHEXYPHENIDYL HCL 2 MG PO TABS
1.0000 mg | ORAL_TABLET | Freq: Every day | ORAL | Status: DC
  Administered 2017-10-27: 1 mg via ORAL
  Filled 2017-10-27 (×2): qty 1

## 2017-10-27 NOTE — ED Notes (Signed)
Pt has refused vital signs at this time; pt yelled, "Leave me alone!".

## 2017-10-27 NOTE — ED Notes (Signed)
Bed: [id number] Expected date:  Expected time:  Means of arrival:  Comments: 22 yr old Behavioral issue

## 2017-10-27 NOTE — ED Notes (Signed)
Patient resting in bed at this time with no signs of distress noted. Writer asked pt if she has any needs at this time; pt declines. Pt with no eye contact with staff and is withdrawn and avoidant of interaction.

## 2017-10-27 NOTE — ED Triage Notes (Signed)
Pt brought in by PTAR from an apartment complex parking lot.  Pt was observed by a bystander yelling and screaming by herself.  Pt admitted to hearing voices on EMS' arrival at the scene.  Pt exhibited erratic behaviors with EMS---- sometimes very loud, very soft at other times.

## 2017-10-27 NOTE — ED Notes (Signed)
Patient given a sandwich and graham crackers with drink.

## 2017-10-27 NOTE — ED Provider Notes (Signed)
[facility]-EMERGENCY DEPT Provider Note   CSN: [id number] Arrival date & time: 10/27/17  0501     History   Chief Complaint Chief Complaint  Patient presents with  . Hallucinations    HPI Amy [name] is a 22 y.o. female.  HPI Level 5 caveat due to psychiatric disorder and substance abuse. Patient reportedly brought in for yelling and screaming in a parking lot.  Patient states she is on no medications.  History of schizophrenia and substance abuse.  States she mostly uses "ice".  Somewhat difficult to get a history from.  Does have a strange affect.  Patient states she has been hearing voices to get into her head.  She would not really answer my question about suicidal or homicidal thoughts. Past Medical History:  Diagnosis Date  . Cannabis abuse   . Depression   . 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 Schizo affective schizophrenia [facility](HCC)     Patient Active Problem List   Diagnosis Date Noted  . Cocaine abuse with cocaine-induced mood disorder (HCC) 12/16/2016  . Bipolar disorder, curr episode mixed, severe, with psychotic features (HCC) 10/25/2016  . Cocaine use disorder, mild, abuse (HCC) 10/25/2016  . Cannabis use disorder, severe, dependence (HCC) 10/25/2016  . Insomnia   . Anxiety state   . Cocaine abuse (HCC) 07/04/2015  . Cannabis abuse, continuous 07/02/2015  . Non compliance with medical treatment 06/18/2015  . Bipolar affective disorder (HCC) 03/02/2015    History reviewed. No pertinent surgical history.   OB History   None      Home Medications    Prior to Admission medications   Medication Sig Start Date End Date Taking? Authorizing Provider  ARIPiprazole ER 400 MG SRER Inject 400 mg into the muscle every 28 (twenty-eight) days. (Due 11-28-16): For mood control 11/28/16   Armandina StammerNwoko, Agnes I, NP  haloperidol (HALDOL) 20 MG tablet Take 20 mg by mouth once.    [provider]  lithium carbonate 300 MG capsule Take 3 capsules  (900 mg total) by mouth at bedtime. For mood stabilization Patient not taking: Reported on 08/19/2017 11/01/16   Armandina StammerNwoko, Agnes I, NP  traZODone (DESYREL) 150 MG tablet Take 1 tablet (150 mg total) by mouth at bedtime. For sleep Patient not taking: Reported on 12/16/2016 11/01/16   Sanjuana KavaNwoko, Agnes I, NP    Family History Family History  Problem Relation Age of Onset  . Mental illness Neg Hx     Social History Social History   Tobacco Use  . Smoking status: Current Every Day Smoker    Types: Cigarettes  . Smokeless tobacco: Never Used  Substance Use Topics  . Alcohol use: No  . Drug use: Yes    Types: Marijuana    Comment: denies     Allergies   Cogentin [benztropine]; Depakote [divalproex sodium]; Risperidone; Valproic acid; and Penicillins   Review of Systems Review of Systems  Unable to perform ROS: Psychiatric disorder     Physical Exam Updated Vital Signs BP 112/64   Pulse 80   Temp 98.1 F (36.7 C) (Oral)   Resp 18   SpO2 99%   Physical Exam  Constitutional: She appears well-developed.  HENT:  Head: Normocephalic.  Neck: Neck supple.  Cardiovascular: Normal rate.  Pulmonary/Chest: She exhibits no tenderness.  Abdominal: Soft.  Musculoskeletal: She exhibits no tenderness.  Neurological: She is alert.  some confusion.  Strange affect.  Wandering thoughts.  Skin: Skin is warm. Capillary refill takes less than 2  seconds.  Psychiatric:  Strange affect     ED Treatments / Results  Labs (all labs ordered are listed, but only abnormal results are displayed) Labs Reviewed  COMPREHENSIVE METABOLIC PANEL - Abnormal; Notable for the following components:      Result Value   Creatinine, Ser 1.03 (*)    All other components within normal limits  LITHIUM LEVEL - Abnormal; Notable for the following components:   Lithium Lvl <0.06 (*)    All other components within normal limits  CBC WITH DIFFERENTIAL/PLATELET - Abnormal; Notable for the following components:    MCH 34.4 (*)    All other components within normal limits  ETHANOL  PREGNANCY, URINE  URINALYSIS, ROUTINE W REFLEX MICROSCOPIC  RAPID URINE DRUG SCREEN, HOSP PERFORMED  I-STAT BETA HCG BLOOD, ED ([facility], [facility], [facility] ONLY)    EKG None  Radiology No results found.  Procedures Procedures (including critical care time)  Medications Ordered in ED Medications - No data to display   Initial Impression / Assessment and Plan / ED Course  I have reviewed the triage vital signs and the nursing notes.  Pertinent labs & imaging results that were available during my care of the patient were reviewed by me and considered in my medical decision making (see chart for details).     Patient with substance abuse and history of schizoaffective schizophrenia.  Sounds like she is noncompliant with her medications.  She has a lithium level of 0 although I cannot verify she is still on that medicine. Patient is medically cleared.  To be seen by TTS.  Final Clinical Impressions(s) / ED Diagnoses   Final diagnoses:  Substance abuse [facility])    ED Discharge Orders    None       Benjiman Core, MD 10/27/17 [phone]

## 2017-10-27 NOTE — BH Assessment (Signed)
[facility] Assessment Progress Note  Case was staffed with Parks FNP who recommended patient be monitored and observed for safety. Patient will be seen by psychiatry in the a.m.      

## 2017-10-27 NOTE — ED Notes (Addendum)
Patient arrived to Acute Care Unit alert and cooperative.  She is exhibiting rapid speech, tangential thoughts, and flight of ideas.  Patient reports she has [facility] an [facility] days and her doctor was Dr. Otelia SanteeFarrah.  Patient is also having paranoid and persecutory delusions.  She reports she lives with her Mom.  She also made references to aliens and people out to get her.  She is anxious but cooperative and asked for a Sprite and something to eat.  Patient did not want to watch TV' as she said she was going to sleep.  Patient told nurse she has only been diagnosed with depression.

## 2017-10-27 NOTE — BH Assessment (Addendum)
Assessment Note  Amy [name] is an 22 y.o. female that presents this date with altered mental state. Per notes, patient brought in by [facility] from an apartment complex parking lot. Patient was observed by a bystander yelling and screaming by herself. Patient admitted to hearing voices on arrival at the scene. Patient exhibited erratic behaviors with EMS and speaks incoherently. This writer attempted to assess patient unsuccessfully. Patient will not respond to this writer's questions. Patient appears to be actively impaired and has a history of amphetamine use. Patient's UDS this date was positive for amphetamines and THC.  Per history review patient has had multiple admissions to area providers and was last seen by this provider on 08/19/17 when patient presented with symptoms associated with depression and misuse of medications. Per note review patient has been receiving services from [facility] although this cannot be confirmed this date. Information to complete assessment was obtained from prior history and admission notes this date. Per notes, patient reportedly brought in for yelling and screaming in a parking lot. Patient states she is on no medications. History of schizophrenia and substance abuse. States she mostly uses "ice". Patient is unable to render a accurate history. Patient presents with a bizarre affect. Patient states she has been hearing voices that get into her head. She would not answer any questions about suicidal or homicidal thoughts. Case was staffed with Arville Care FNP who recommended patient be monitored and observed for safety. Patient will be seen by psychiatry in the a.m.   Diagnosis: F25.9 Schizoaffective disorder (per notes), Polysubstance abuse   Past Medical History:  Past Medical History:  Diagnosis Date  . Cannabis abuse   . Depression   . 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 Schizo affective schizophrenia (HCC)     History reviewed. No pertinent surgical  history.  Family History:  Family History  Problem Relation Age of Onset  . Mental illness Neg Hx     Social History:  reports that she has been smoking cigarettes.  She has never used smokeless tobacco. She reports that she has current or past drug history. Drug: Marijuana. She reports that she does not drink alcohol.  Additional Social History:  Alcohol / Drug Use Pain Medications: See MAR Prescriptions: See MAR Over the Counter: See MAR History of alcohol / drug use?: Yes Longest period of sobriety (when/how long): unknown Negative Consequences of Use: (Denies) Withdrawal Symptoms: (denies) Substance #1 Name of Substance 1: Methamphetamine use per hx 1 - Age of First Use: UTA 1 - Amount (size/oz): UTA 1 - Frequency: UTA 1 - Duration: UTA 1 - Last Use / Amount: UTA UDS pending Substance #2 Name of Substance 2: Cannabis per hx 2 - Age of First Use: 20 2 - Amount (size/oz): unknown 2 - Frequency: UTA 2 - Duration: UTA 2 - Last Use / Amount: UTA UDS pending  CIWA: CIWA-Ar BP: (!) 106/56 Pulse Rate: 67 COWS:    Allergies:  Allergies  Allergen Reactions  . Cogentin [Benztropine]   . Depakote [Divalproex Sodium] Other (See Comments)    Creates feelings of paranoia, some suicidal feelings, and makes her feel that "people are coming after" her  . Risperidone Other (See Comments)    "Makes me cough"  . Valproic Acid Other (See Comments)    Creates paranoia/Per [facility]  . Penicillins Rash    Has patient had a PCN reaction causing immediate rash, facial/tongue/throat swelling, SOB or lightheadedness with hypotension: Yes Has patient had  a PCN reaction causing severe rash involving mucus membranes or skin necrosis: No Has patient had a PCN reaction that required hospitalization: No Has patient had a PCN reaction occurring within the last 10 years: Yes If all of the above answers are "NO", then may     Home Medications:  (Not in a hospital admission)  OB/GYN  Status:  No LMP recorded.  General Assessment Data Location of Assessment: [facility] TTS Assessment: In system Is this a Tele or Face-to-Face Assessment?: Face-to-Face Is this an Initial Assessment or a Re-assessment for this encounter?: Initial Assessment Marital status: Single Maiden name: NA Is patient pregnant?: Unknown Pregnancy Status: Unknown Living Arrangements: Alone Can pt return to current living arrangement?: Yes Admission Status: Voluntary Is patient capable of signing voluntary admission?: No(Not on admission) Referral Source: Self/Family/Friend Insurance type: Self Pay  Medical Screening Exam [facility] Walk-in ONLY) Medical Exam completed: Yes  Crisis Care Plan Living Arrangements: Alone Legal Guardian: (NA) Name of Psychiatrist: [facility](Per notes) Name of Therapist: [facility] (Per notes)  Education Status Is patient currently in school?: No Is the patient employed, unemployed or receiving disability?: Unemployed  Risk to self with the past 6 months Suicidal Ideation: (UTA) Has patient been a risk to self within the past 6 months prior to admission? : Yes(Per note review) Suicidal Intent: (UTA) Has patient had any suicidal intent within the past 6 months prior to admission? : Yes(Per notes) Is patient at risk for suicide?: Yes Suicidal Plan?: (UTA) Has patient had any suicidal plan within the past 6 months prior to admission? : Yes(Per notes attempted overdose) Access to Means: (UTA hx of overdose) What has been your use of drugs/alcohol within the last 12 months?: Current use(UDS pending ) Previous Attempts/Gestures: Yes(Per hx 10 times) How many times?: 10(per notes) Other Self Harm Risks: Unknown Triggers for Past Attempts: Unknown Intentional Self Injurious Behavior: (UTA) Family Suicide History: No Recent stressful life event(s): (UTA) Persecutory voices/beliefs?: (UTA) Depression: (UTA) Depression Symptoms: (UTA) Substance abuse  history and/or treatment for substance abuse?: Yes Suicide prevention information given to non-admitted patients: Not applicable  Risk to Others within the past 6 months Homicidal Ideation: (UTA) Does patient have any lifetime risk of violence toward others beyond the six months prior to admission? : (UTA) Thoughts of Harm to Others: (UTA) Current Homicidal Intent: (UTA) Current Homicidal Plan: (UTA) Access to Homicidal Means: (UTA) Identified Victim: (UTA) History of harm to others?: (UTA) Assessment of Violence: (UTA) Violent Behavior Description: (UTA) Does patient have access to weapons?: (UTA) Criminal Charges Pending?: (UTA) Does patient have a court date: (UTA) Is patient on probation?: (UTA)  Psychosis Hallucinations: (UTA) Delusions: (UTA)  Mental Status Report Appearance/Hygiene: In scrubs Eye Contact: Poor Motor Activity: Freedom of movement Speech: Unable to assess Level of Consciousness: Drowsy Mood: (UTA) Affect: (UTA) Anxiety Level: Minimal Thought Processes: (UTA) Judgement: (UTA) Orientation: (UTA) Obsessive Compulsive Thoughts/Behaviors: (UTA)  Cognitive Functioning Concentration: (UTA) Memory: Unable to Assess Is patient IDD: No Is patient DD?: No Insight: Unable to Assess Impulse Control: Unable to Assess Appetite: (UTA) Have you had any weight changes? : (UTA) Sleep: (UTA) Total Hours of Sleep: (UTA) Vegetative Symptoms: (UTA)  ADLScreening [facility] Assessment Services) Patient's cognitive ability adequate to safely complete daily activities?: Yes Patient able to express need for assistance with ADLs?: Yes Independently performs ADLs?: Yes (appropriate for developmental age)  Prior Inpatient Therapy Prior Inpatient Therapy: Yes Prior Therapy Dates: 2019, 2018,2017 Prior Therapy Facilty/Provider(s): [facility], [facility], [facility] Reason  for Treatment: MH issues  Prior Outpatient Therapy Prior Outpatient Therapy: Yes Prior Therapy Dates: Ongoing(Per  history) Prior Therapy Facilty/Provider(s): [facility](Per notes) Reason for Treatment: [place] Does patient have an ACCT team?: Yes Does patient have Intensive In-House Services?  : No Does patient have [facility] services? : No Does patient have P4CC services?: No  ADL Screening (condition at time of admission) Patient's cognitive ability adequate to safely complete daily activities?: Yes Is the patient deaf or have difficulty hearing?: No Does the patient have difficulty seeing, even when wearing glasses/contacts?: No Does the patient have difficulty concentrating, remembering, or making decisions?: No Patient able to express need for assistance with ADLs?: Yes Does the patient have difficulty dressing or bathing?: No Independently performs ADLs?: Yes (appropriate for developmental age) Does the patient have difficulty walking or climbing stairs?: No Weakness of Legs: None Weakness of Arms/Hands: None  Home Assistive Devices/Equipment Home Assistive Devices/Equipment: None  Therapy Consults (therapy consults require a physician order) PT Evaluation Needed: No OT Evalulation Needed: No SLP Evaluation Needed: No Abuse/Neglect Assessment (Assessment to be complete while patient is alone) Physical Abuse: Denies(Per hx) Verbal Abuse: Denies(Per hx) Sexual Abuse: Denies(Per hx) Exploitation of patient/patient's resources: Denies(Per hx) Self-Neglect: Denies Values / Beliefs Cultural Requests During Hospitalization: None Spiritual Requests During Hospitalization: None Consults Spiritual Care Consult Needed: No Social Work Consult Needed: No Merchant navy officerAdvance Directives (For Healthcare) Does Patient Have a Medical Advance Directive?: No Would patient like information on creating a medical advance directive?: No - Patient declined    Additional Information 1:1 In Past 12 Months?: No CIRT Risk: No Elopement Risk: No Does patient have medical clearance?: Yes     Disposition: Case  was staffed with Arville CareParks FNP who recommended patient be monitored and observed for safety. Patient will be seen by psychiatry in the a.m.   Disposition Initial Assessment Completed for this Encounter: Yes Disposition of Patient: (Observe and monitor) Patient refused recommended treatment: (UTA) Mode of transportation if patient is discharged?: Loreli Slot(UNK)  On Site Evaluation by:   Reviewed with Physician:    Alfredia Fergusonavid L Baylynn Shifflett 10/27/2017 12:39 PM

## 2017-10-28 DIAGNOSIS — F329 Major depressive disorder, single episode, unspecified: Secondary | ICD-10-CM

## 2017-10-28 DIAGNOSIS — F17210 Nicotine dependence, cigarettes, uncomplicated: Secondary | ICD-10-CM

## 2017-10-28 DIAGNOSIS — F410 Panic disorder [episodic paroxysmal anxiety] without agoraphobia: Secondary | ICD-10-CM

## 2017-10-28 DIAGNOSIS — F419 Anxiety disorder, unspecified: Secondary | ICD-10-CM

## 2017-10-28 DIAGNOSIS — R4587 Impulsiveness: Secondary | ICD-10-CM

## 2017-10-28 DIAGNOSIS — F1910 Other psychoactive substance abuse, uncomplicated: Secondary | ICD-10-CM | POA: Insufficient documentation

## 2017-10-28 DIAGNOSIS — Z658 Other specified problems related to psychosocial circumstances: Secondary | ICD-10-CM

## 2017-10-28 DIAGNOSIS — F1510 Other stimulant abuse, uncomplicated: Secondary | ICD-10-CM

## 2017-10-28 DIAGNOSIS — F12150 Cannabis abuse with psychotic disorder with delusions: Secondary | ICD-10-CM

## 2017-10-28 DIAGNOSIS — R454 Irritability and anger: Secondary | ICD-10-CM

## 2017-10-28 MED ORDER — TRAZODONE HCL 100 MG PO TABS: 100.0000 mg | ORAL_TABLET | Freq: Every day | ORAL | 0 refills | Status: DC

## 2017-10-28 MED ORDER — HALOPERIDOL 10 MG PO TABS: 10.0000 mg | ORAL_TABLET | Freq: Two times a day (BID) | ORAL | 0 refills | Status: DC

## 2017-10-28 MED ORDER — LITHIUM CARBONATE ER 300 MG PO TBCR: 300.0000 mg | EXTENDED_RELEASE_TABLET | Freq: Two times a day (BID) | ORAL | 0 refills | Status: DC

## 2017-10-28 MED ORDER — TRIHEXYPHENIDYL HCL 2 MG PO TABS: 1.0000 mg | ORAL_TABLET | Freq: Every day | ORAL | 0 refills | Status: DC
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
## 2017-10-28 NOTE — Patient Outreach (Signed)
ED Peer Support Specialist Patient Intake (Complete at intake & 30-60 Day Follow-up)  Name: Amy [name]  MRN: [id number]  Age: 22 y.o.   Date of Admission: 10/28/2017  Intake: Initial Comments:      Primary Reason Admitted: Substance Abuse   Lab values: Alcohol/ETOH: Negative Positive UDS? Yes Amphetamines: Yes Barbiturates: No Benzodiazepines: No Cocaine: No Opiates: No Cannabinoids: Yes  Demographic information: Gender: Female Ethnicity: Other (comment) Marital Status: Single Insurance Status: Uninsured/Self-pay Ecologist (Work Neurosurgeon, Physicist, medical, etc.: No Lives with: Alone Living situation: House/Apartment  Reported Patient History: Patient reported health conditions: None Patient aware of HIV and hepatitis status: No  In past year, has patient visited ED for any reason? Yes  Number of ED visits: 4  Reason(s) for visit: Various reasons  In past year, has patient been hospitalized for any reason? No  Number of hospitalizations:    Reason(s) for hospitalization:    In past year, has patient been arrested? No  Number of arrests:    Reason(s) for arrest:    In past year, has patient been incarcerated? No  Number of incarcerations:    Reason(s) for incarceration:    In past year, has patient received medication-assisted treatment? No  In past year, patient received the following treatments: Residential treatment (non-hospital)  In past year, has patient received any harm reduction services? No  Did this include any of the following?    In past year, has patient received care from a mental health provider for diagnosis other than SUD? No  In past year, is this first time patient has overdosed? Yes  Number of past overdoses:    In past year, is this first time patient has been hospitalized for an overdose? No  Number of hospitalizations for overdose(s):    Is patient currently receiving treatment for a mental  health diagnosis? No  Patient reports experiencing difficulty participating in SUD treatment: No    Most important reason(s) for this difficulty?    Has patient received prior services for treatment? No  In past, patient has received services from following agencies:    Plan of Care:  Suggested follow up at these agencies/treatment centers: [facility]  Other information: CPSS met with Pt and was made aware that Pt did not want to be bothered and that she just wanted to get home to her medications. CPSS prompted Pt several times to complete the series of questions. CPSS was made aware that she was fine and did not want to receive any services. CPSS made Pt aware that CPSS follow up with Pt in a few.    Aaron Edelman Sabra Sessler, CPSS  10/28/2017 9:31 AM

## 2017-10-28 NOTE — ED Notes (Signed)
Written dc instructions and prescriptions reviewed with pt.  Pt encourage to take medications as directed and contact her MD as soon as possible for follow up.  Pt verbalized understanding.  Pt up to the bathroom to shower prior to leaving.

## 2017-10-28 NOTE — ED Notes (Signed)
Pt polite, cooperative, eating breakfast

## 2017-10-28 NOTE — ED Notes (Signed)
Brian peer support into see 

## 2017-10-28 NOTE — ED Notes (Addendum)
Pt denies si/hi/avh/pain on dc.  Ambulatory w/o difficulty to dc area w/ mHt, belongings returned after leaving the area.  Bus pass given.

## 2017-10-28 NOTE — Consult Note (Addendum)
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 Substance abuse ([place]) [F19.10]   . Cocaine abuse with cocaine-induced mood disorder ([place]) [F14.14] 12/16/2016  . Bipolar disorder, curr episode mixed, severe, with psychotic features ([place]) [F31.64] 10/25/2016  . Cocaine use disorder, mild, abuse ([place]) [F14.10] 10/25/2016  . Cannabis use disorder, severe, dependence ([place]) [F12.20] 10/25/2016  . Insomnia [G47.00]   . Anxiety state [F41.1]   . Cocaine abuse ([place]) [F14.10] 07/04/2015  . Cannabis abuse, continuous [F12.10] 07/02/2015  . Non compliance with medical treatment [Z91.19] 06/18/2015  . Bipolar affective disorder ([place]) [F31.9] 03/02/2015    Total Time spent with patient: 45 minutes  Subjective:   Amy [name] is a 22 y.o. female patient admitted with bizarre behavior in an apartment complex parking lot.  HPI:  Pt was seen and chart reviewed with treatment team and Dr Darleene Cleaver. Pt denies suicidal/homicidal ideation, denies auditory/visual hallucinations and does not appear to be responding to internal stimuli. Pt stated she had a panic attack and that is why she came to the hospital. Per chart review: Pt was yelling and screaming in a parking lot at an apartment complex. Today Pt is stating she was being bullied and this is what caused her panic attack. Pt denied drug and alcohol use but UDS is positive for amphetamines and THC, BAL negative. Pt stated she has never been hospitalized in a psychiatric facility but is followed by Dr Sheppard Evens with psychiatry and has been admitted to [facility] health unit in the past. Pt stated she wants to  go home and will follow up with Dr Sheppard Evens to resume her medications. Pt is stable and psychiatrically clear for discharge. Pt will be given 14 day supply of her home medications on discharge.   Past Psychiatric History: As above  Risk to Self: None Risk to Others: None Prior Inpatient Therapy: Prior Inpatient Therapy: Yes Prior Therapy Dates: 2019, 2018,2017 Prior Therapy Facilty/Provider(s): [facility], [facility] Reason for Treatment: MH issues Prior Outpatient Therapy: Prior Outpatient Therapy: Yes Prior Therapy Dates: Ongoing(Per history) Prior Therapy Facilty/Provider(s): [facility](Per notes) Reason for Treatment: [place] Does patient have an ACCT team?: Yes Does patient have Intensive In-House Services?  : No Does patient have [facility] services? : No Does patient have P4CC services?: No  Past Medical History:  Past Medical History:  Diagnosis Date  . Cannabis abuse   . Depression   . Methamphetamine abuse ([place])   . Schizo affective schizophrenia ([place])    History reviewed. No pertinent surgical history. Family History:  Family History  Problem Relation Age of Onset  . Mental illness Neg Hx    Family Psychiatric  History: Unknown Social History:  Social History   Substance and Sexual Activity  Alcohol Use No     Social History   Substance and Sexual Activity  Drug Use Yes  . Types: Marijuana   Comment: denies    Social History   Socioeconomic History  . Marital status: Single    Spouse name: Not on file  . Number of children: Not on file  . Years of education: Not on file  . Highest education level: Not on file  Occupational History  .  Not on file  Social Needs  . Financial resource strain: Not on file  . Food insecurity:    Worry: Not on file    Inability: Not on file  . Transportation needs:    Medical: Not on file    Non-medical: Not on file  Tobacco Use  . Smoking status: Current Every Day Smoker    Types: Cigarettes  . Smokeless tobacco: Never  Used  Substance and Sexual Activity  . Alcohol use: No  . Drug use: Yes    Types: Marijuana    Comment: denies  . Sexual activity: Not on file  Lifestyle  . Physical activity:    Days per week: Not on file    Minutes per session: Not on file  . Stress: Not on file  Relationships  . Social connections:    Talks on phone: Not on file    Gets together: Not on file    Attends religious service: Not on file    Active member of club or organization: Not on file    Attends meetings of clubs or organizations: Not on file    Relationship status: Not on file  Other Topics Concern  . Not on file  Social History Narrative  . Not on file   Additional Social History:    Allergies:   Allergies  Allergen Reactions  . Cogentin [Benztropine] Itching  . Depakote [Divalproex Sodium] Other (See Comments)    Creates feelings of paranoia, some suicidal feelings, and makes her feel that "people are coming after" her  . Risperidone Other (See Comments)    "Makes me cough"  . Valproic Acid Other (See Comments)    Creates paranoia/Per [facility]  . Penicillins Rash    Has patient had a PCN reaction causing immediate rash, facial/tongue/throat swelling, SOB or lightheadedness with hypotension: Yes Has patient had a PCN reaction causing severe rash involving mucus membranes or skin necrosis: No Has patient had a PCN reaction that required hospitalization: No Has patient had a PCN reaction occurring within the last 10 years: Yes If all of the above answers are "NO", then may     Labs:  Results for orders placed or performed during the hospital encounter of 10/27/17 (from the past 48 hour(s))  Comprehensive metabolic panel     Status: Abnormal   Collection Time: 10/27/17  5:36 AM  Result Value Ref Range   Sodium 139 135 - 145 mmol/L   Potassium 3.8 3.5 - 5.1 mmol/L   Chloride 104 101 - 111 mmol/L   CO2 23 22 - 32 mmol/L   Glucose, Bld 76 65 - 99 mg/dL   BUN 16 6 - 20 mg/dL    Creatinine, Ser 1.03 (H) 0.44 - 1.00 mg/dL   Calcium 9.4 8.9 - 10.3 mg/dL   Total Protein 7.6 6.5 - 8.1 g/dL   Albumin 4.4 3.5 - 5.0 g/dL   AST 21 15 - 41 U/L   ALT 17 14 - 54 U/L   Alkaline Phosphatase 50 38 - 126 U/L   Total Bilirubin 1.0 0.3 - 1.2 mg/dL   GFR calc non Af Amer >60 >60 mL/min   GFR calc Af Amer >60 >60 mL/min    Comment: (NOTE) The eGFR has been calculated using the CKD EPI equation. This calculation has not been validated in all clinical situations. eGFR's persistently <60 mL/min signify possible Chronic Kidney Disease.    Anion gap 12 5 - 15    Comment: Performed at [facility], [place] [address], [place], [place] [address]  Ethanol     Status: None   Collection Time: 10/27/17  5:36 AM  Result Value Ref Range   Alcohol, Ethyl (B) <10 <10 mg/dL    Comment: (NOTE) Lowest detectable limit for serum alcohol is 10 mg/dL. For medical purposes only. Performed at [facility], [place] [address], [place], [place] [address]   Lithium level     Status: Abnormal   Collection Time: 10/27/17  5:36 AM  Result Value Ref Range   Lithium Lvl <0.06 (L) 0.60 - 1.20 mmol/L    Comment: Performed at [facility], [place] [address], [place], [place] [address]  CBC with Differential     Status: Abnormal   Collection Time: 10/27/17  5:36 AM  Result Value Ref Range   WBC 7.3 4.0 - 10.5 K/uL   RBC 4.24 3.87 - 5.11 MIL/uL   Hemoglobin 14.6 12.0 - 15.0 g/dL   HCT 42.2 36.0 - 46.0 %   MCV 99.5 78.0 - 100.0 fL   MCH 34.4 (H) 26.0 - 34.0 pg   MCHC 34.6 30.0 - 36.0 g/dL   RDW 12.9 11.5 - 15.5 %   Platelets 254 150 - 400 K/uL   Neutrophils Relative % 41 %   Neutro Abs 3.0 1.7 - 7.7 K/uL   Lymphocytes Relative 45 %   Lymphs Abs 3.3 0.7 - 4.0 K/uL   Monocytes Relative 13 %   Monocytes Absolute 1.0 0.1 - 1.0 K/uL   Eosinophils Relative 1 %   Eosinophils Absolute 0.1 0.0 - 0.7 K/uL   Basophils Relative 0 %   Basophils Absolute 0.0  0.0 - 0.1 K/uL    Comment: Performed at [facility], [place] [address], [place], [place] [address]  I-Stat Beta hCG blood, ED ([facility], [facility], [facility] only)     Status: None   Collection Time: 10/27/17  5:47 AM  Result Value Ref Range   I-stat hCG, quantitative <5.0 <5 mIU/mL   Comment 3            Comment:   GEST. AGE      CONC.  (mIU/mL)   <=1 WEEK        5 - 50     2 WEEKS       50 - 500     3 WEEKS       100 - 10,000     4 WEEKS     1,000 - 30,000        FEMALE AND NON-PREGNANT FEMALE:     LESS THAN 5 mIU/mL   Pregnancy, urine     Status: None   Collection Time: 10/27/17  7:55 AM  Result Value Ref Range   Preg Test, Ur NEGATIVE NEGATIVE    Comment:        THE SENSITIVITY OF THIS METHODOLOGY IS >20 mIU/mL. Performed at [facility], [place] [address] [name], [place] [address]   Urinalysis, Routine w reflex microscopic     Status: Abnormal   Collection Time: 10/27/17  7:55 AM  Result Value Ref Range   Color, Urine AMBER (A) YELLOW    Comment: BIOCHEMICALS MAY BE AFFECTED BY COLOR   APPearance CLOUDY (A) CLEAR   Specific Gravity, Urine 1.032 (H) 1.005 - 1.030   pH 5.0 5.0 - 8.0   Glucose, UA NEGATIVE NEGATIVE mg/dL   Hgb urine dipstick NEGATIVE NEGATIVE   Bilirubin Urine NEGATIVE NEGATIVE  Ketones, ur 20 (A) NEGATIVE mg/dL   Protein, ur 100 (A) NEGATIVE mg/dL   Nitrite NEGATIVE NEGATIVE   Leukocytes, UA NEGATIVE NEGATIVE   RBC / HPF 0-5 0 - 5 RBC/hpf   WBC, UA 0-5 0 - 5 WBC/hpf   Bacteria, UA RARE (A) NONE SEEN   Squamous Epithelial / LPF 11-20 0 - 5   Mucus PRESENT    Hyaline Casts, UA PRESENT     Comment: Performed at [facility], [place] [address], [place], [place] [address]  Urine rapid drug screen (hosp performed)     Status: Abnormal   Collection Time: 10/27/17  7:55 AM  Result Value Ref Range   Opiates NONE DETECTED NONE DETECTED   Cocaine NONE DETECTED NONE DETECTED   Benzodiazepines NONE DETECTED NONE DETECTED    Amphetamines POSITIVE (A) NONE DETECTED   Tetrahydrocannabinol POSITIVE (A) NONE DETECTED   Barbiturates NONE DETECTED NONE DETECTED    Comment: (NOTE) DRUG SCREEN FOR MEDICAL PURPOSES ONLY.  IF CONFIRMATION IS NEEDED FOR ANY PURPOSE, NOTIFY LAB WITHIN 5 DAYS. LOWEST DETECTABLE LIMITS FOR URINE DRUG SCREEN Drug Class                     Cutoff (ng/mL) Amphetamine and metabolites    1000 Barbiturate and metabolites    200 Benzodiazepine                 573 Tricyclics and metabolites     300 Opiates and metabolites        300 Cocaine and metabolites        300 THC                            50 Performed at [facility], [place] [address], [place], [place] [address]     Current Facility-Administered Medications  Medication Dose Route Frequency Provider Last Rate Last Dose  . haloperidol (HALDOL) tablet 10 mg  10 mg Oral BID Ethelene Hal, NP   10 mg at 10/28/17 0958  . hydrOXYzine (ATARAX/VISTARIL) tablet 25 mg  25 mg Oral TID Ethelene Hal, NP   25 mg at 10/28/17 0958  . lithium carbonate (LITHOBID) CR tablet 300 mg  300 mg Oral Q12H Ethelene Hal, NP   300 mg at 10/28/17 0958  . traZODone (DESYREL) tablet 100 mg  100 mg Oral QHS Ethelene Hal, NP   100 mg at 10/27/17 2050  . trihexyphenidyl (ARTANE) tablet 1 mg  1 mg Oral Q supper Ethelene Hal, NP   1 mg at 10/27/17 1740   Current Outpatient Medications  Medication Sig Dispense Refill  . ARIPiprazole ER (ABILIFY MAINTENA) 400 MG PRSY prefilled syringe Inject 400 mg into the muscle every 30 (thirty) days.    Marland Kitchen ibuprofen (ADVIL,MOTRIN) 200 MG tablet Take 400 mg by mouth every 6 (six) hours as needed for moderate pain.    . ARIPiprazole ER 400 MG SRER Inject 400 mg into the muscle every 28 (twenty-eight) days. (Due 11-28-16): For mood control (Patient not taking: Reported on 10/27/2017) 1 each 0  . haloperidol (HALDOL) 20 MG tablet Take 20 mg by mouth once.    . lithium carbonate  300 MG capsule Take 3 capsules (900 mg total) by mouth at bedtime. For mood stabilization (Patient not taking: Reported on 08/19/2017) 90 capsule 0  . traZODone (DESYREL) 150 MG tablet Take 1 tablet (150 mg total) by  mouth at bedtime. For sleep (Patient not taking: Reported on 12/16/2016) 30 tablet 0    Musculoskeletal: Strength & Muscle Tone: within normal limits Gait & Station: normal Patient leans: [name]  Psychiatric Specialty Exam: Physical Exam  Constitutional: She is oriented to person, place, and time. She appears well-developed and well-nourished.  HENT:  Head: Normocephalic.  Respiratory: Effort normal.  Musculoskeletal: Normal range of motion.  Neurological: She is alert and oriented to person, place, and time.  Psychiatric: Her speech is normal and behavior is normal. Thought content normal. Cognition and memory are normal. She expresses impulsivity. She exhibits a depressed mood.    Review of Systems  Psychiatric/Behavioral: Positive for depression and substance abuse. Negative for hallucinations, memory loss and suicidal ideas. The patient is not nervous/anxious and does not have insomnia.   All other systems reviewed and are negative.   Blood pressure (!) 91/53, pulse (!) 55, temperature 98.7 F (37.1 C), temperature source Oral, resp. rate 18, SpO2 99 %.There is no height or weight on file to calculate BMI.  General Appearance: Disheveled  Eye Contact:  Fair  Speech:  Clear and Coherent  Volume:  Normal  Mood:  Anxious and Irritable  Affect:  Congruent  Thought Process:  Coherent, Goal Directed and Linear  Orientation:  Full (Time, Place, and Person)  Thought Content:  Logical  Suicidal Thoughts:  No  Homicidal Thoughts:  No  Memory:  Immediate;   Good Recent;   Good Remote;   Fair  Judgement:  Fair  Insight:  Lacking  Psychomotor Activity:  Normal  Concentration:  Concentration: Good and Attention Span: Good  Recall:  [place] of Knowledge:  Good  Language:   Good  Akathisia:  No  Handed:  Right  AIMS (if indicated):     Assets:  Agricultural consultant Housing Social Support  ADL's:  Intact  Cognition:  WNL  Sleep:   Good     Treatment Plan Summary: Plan 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   Patient does not meet criteria for psychiatric inpatient admission. Supportive therapy provided about ongoing stressors. Discussed crisis plan, support from social network, calling 911, coming to the Emergency Department, and calling Suicide Hotline.  Ethelene Hal, NP 10/28/2017 11:49 AM  Patient seen face-to-face for psychiatric evaluation, chart reviewed and case discussed with the physician extender and developed treatment plan. Reviewed the information documented and agree with the treatment plan. Corena Pilgrim, MD

## 2017-10-28 NOTE — [facility] Suicide Risk Assessment (Cosign Needed)
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 Substance abuse ([facility]) [F19.10]   . Cocaine abuse with cocaine-induced mood disorder ([facility]) [F14.14] 12/16/2016  . Bipolar disorder, curr episode mixed, severe, with psychotic features ([facility]) [F31.64] 10/25/2016  . Cocaine use disorder, mild, abuse ([facility]) [F14.10] 10/25/2016  . Cannabis use disorder, severe, dependence ([facility]) [F12.20] 10/25/2016  . Insomnia [G47.00]   . Anxiety state [F41.1]   . Cocaine abuse ([facility]) [F14.10] 07/04/2015  . Cannabis abuse, continuous [F12.10] 07/02/2015  . Non compliance with medical treatment [Z91.19] 06/18/2015  . Bipolar affective disorder ([facility]) [F31.9] 03/02/2015    Total Time spent with patient: 45 minutes  Musculoskeletal: Strength & Muscle Tone: within normal limits Gait & Station: normal Patient leans: [name]  Psychiatric Specialty Exam: Physical Exam  Constitutional: She is oriented to person, place, and time. She appears well-developed and well-nourished.  HENT:  Head: Normocephalic.  Respiratory: Effort normal.  Musculoskeletal: Normal range of motion.  Neurological: She is alert and oriented to person, place, and time.  Psychiatric: Her speech is normal and behavior is normal. Thought content normal. Cognition and memory are normal. She expresses impulsivity. She exhibits a depressed mood.   Review of Systems  Psychiatric/Behavioral: Positive for depression and substance abuse. Negative for hallucinations, memory loss and suicidal ideas. The patient is not nervous/anxious and does not have insomnia.   All other systems reviewed and are negative.  Blood pressure (!) 91/53, pulse (!) 55, temperature 98.7 F (37.1 C),  temperature source Oral, resp. rate 18, SpO2 99 %.There is no height or weight on file to calculate BMI. General Appearance: Disheveled Eye Contact:  Fair Speech:  Clear and Coherent Volume:  Normal Mood:  Anxious and Irritable Affect:  Congruent Thought Process:  Coherent, Goal Directed and Linear Orientation:  Full (Time, Place, and Person) Thought Content:  Logical Suicidal Thoughts:  No Homicidal Thoughts:  No Memory:  Immediate;   Good Recent;   Good Remote;   Fair Judgement:  Fair Insight:  Lacking Psychomotor Activity:  Normal Concentration:  Concentration: Good and Attention Span: Good Recall:  Fair Fund of Knowledge:  Good Language:  Good Akathisia:  No Handed:  Right AIMS (if indicated):    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 Social Support ADL's:  Intact Cognition:  WNL Sleep:   Good   Mental Status Per Nursing Assessment::   On Admission:   Bizarre behavior  Demographic Factors:  Adolescent or young adult, Low socioeconomic status and Unemployed  Loss Factors: Financial problems/change in socioeconomic status  Historical Factors: Impulsivity  Risk Reduction Factors:   Sense of responsibility to family  Continued Clinical Symptoms:  Panic Attacks Depression:   Comorbid alcohol abuse/dependence Impulsivity Alcohol/Substance Abuse/Dependencies  Cognitive Features That Contribute To Risk:  Closed-mindedness    Suicide Risk:  Minimal: No identifiable suicidal ideation.  Patients presenting with no risk factors but with morbid ruminations; may be classified as minimal risk based on the severity of the depressive symptoms    Plan Of Care/Follow-up recommendations:  Activity:  as tolerated Diet:  Heart Healthy  Laveda AbbeLaurie Britton Dorethy Tomey, NP 10/28/2017, 12:02 PM

## 2017-11-02 ENCOUNTER — Encounter (HOSPITAL_COMMUNITY): Payer: Self-pay | Admitting: Emergency Medicine

## 2017-11-02 ENCOUNTER — Emergency Department (HOSPITAL_COMMUNITY)
Admission: EM | Admit: 2017-11-02 | Discharge: 2017-11-02 | Disposition: A | Discharge status: Home / Self Care | Payer: Self-pay | Attending: Emergency Medicine | Admitting: Emergency Medicine

## 2017-11-02 DIAGNOSIS — Y929 Unspecified place or not applicable: Secondary | ICD-10-CM | POA: Insufficient documentation

## 2017-11-02 DIAGNOSIS — Y939 Activity, unspecified: Secondary | ICD-10-CM | POA: Insufficient documentation

## 2017-11-02 DIAGNOSIS — Y998 Other external cause status: Secondary | ICD-10-CM | POA: Insufficient documentation

## 2017-11-02 DIAGNOSIS — S83412A Sprain of medial collateral ligament of left knee, initial encounter: Secondary | ICD-10-CM

## 2017-11-02 DIAGNOSIS — Z79899 Other long term (current) drug therapy: Secondary | ICD-10-CM | POA: Insufficient documentation

## 2017-11-02 DIAGNOSIS — S0181XA Laceration without foreign body of other part of head, initial encounter: Secondary | ICD-10-CM

## 2017-11-02 DIAGNOSIS — F17210 Nicotine dependence, cigarettes, uncomplicated: Secondary | ICD-10-CM | POA: Insufficient documentation

## 2017-11-02 MED ORDER — IBUPROFEN 800 MG PO TABS: 800.0000 mg | ORAL_TABLET | Freq: Two times a day (BID) | ORAL | 0 refills | Status: DC

## 2017-11-02 MED ORDER — IBUPROFEN 800 MG PO TABS
800.0000 mg | ORAL_TABLET | Freq: Once | ORAL | Status: AC
  Administered 2017-11-02: 800 mg via ORAL
  Filled 2017-11-02: qty 1
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
## 2017-11-02 NOTE — Discharge Instructions (Addendum)
Get help right away if: You cannot use your injured joint to support any of your body weight (cannot bear weight). You cannot move the injured joint. You cannot walk more than a few steps without pain or without your knee buckling. You have significant pain, swelling, or numbness below the cast, brace, or splint. 

## 2017-11-14 ENCOUNTER — Other Ambulatory Visit: Payer: Self-pay

## 2017-11-14 ENCOUNTER — Encounter (HOSPITAL_COMMUNITY): Payer: Self-pay

## 2017-11-14 ENCOUNTER — Emergency Department (HOSPITAL_COMMUNITY): Payer: Self-pay

## 2017-11-14 ENCOUNTER — Emergency Department (HOSPITAL_COMMUNITY)
Admission: EM | Admit: 2017-11-14 | Discharge: 2017-11-14 | Disposition: A | Discharge status: Home / Self Care | Payer: Self-pay | Attending: Emergency Medicine | Admitting: Emergency Medicine

## 2017-11-14 DIAGNOSIS — Z79899 Other long term (current) drug therapy: Secondary | ICD-10-CM | POA: Insufficient documentation

## 2017-11-14 DIAGNOSIS — F1994 Other psychoactive substance use, unspecified with psychoactive substance-induced mood disorder: Secondary | ICD-10-CM | POA: Diagnosis present

## 2017-11-14 DIAGNOSIS — F3010 Manic episode without psychotic symptoms, unspecified: Secondary | ICD-10-CM

## 2017-11-14 DIAGNOSIS — F1510 Other stimulant abuse, uncomplicated: Secondary | ICD-10-CM | POA: Insufficient documentation

## 2017-11-14 DIAGNOSIS — F17210 Nicotine dependence, cigarettes, uncomplicated: Secondary | ICD-10-CM | POA: Insufficient documentation

## 2017-11-14 LAB — COMPREHENSIVE METABOLIC PANEL
ALBUMIN: 4.9 g/dL (ref 3.5–5.0)
ALK PHOS: 53 U/L (ref 38–126)
ALT: 15 U/L (ref 0–44)
ANION GAP: 8 (ref 5–15)
AST: 23 U/L (ref 15–41)
BILIRUBIN TOTAL: 0.6 mg/dL (ref 0.3–1.2)
BUN: 12 mg/dL (ref 6–20)
CO2: 26 mmol/L (ref 22–32)
Calcium: 9.6 mg/dL (ref 8.9–10.3)
Chloride: 110 mmol/L (ref 98–111)
Creatinine, Ser: 1.06 mg/dL — ABNORMAL HIGH (ref 0.44–1.00)
GFR calc Af Amer: >60 mL/min (ref >60)
GFR calc non Af Amer: >60 mL/min (ref >60)
GLUCOSE: 99 mg/dL (ref 70–99)
POTASSIUM: 3.3 mmol/L — AB (ref 3.5–5.1)
SODIUM: 144 mmol/L (ref 135–145)
Total Protein: 8.4 g/dL — ABNORMAL HIGH (ref 6.5–8.1)

## 2017-11-14 LAB — RAPID URINE DRUG SCREEN, HOSP PERFORMED
AMPHETAMINES: POSITIVE — AB
Benzodiazepines: NOT DETECTED
Cocaine: NOT DETECTED
Opiates: NOT DETECTED
TETRAHYDROCANNABINOL: POSITIVE — AB

## 2017-11-14 LAB — URINALYSIS, ROUTINE W REFLEX MICROSCOPIC
Bilirubin Urine: NEGATIVE
GLUCOSE, UA: NEGATIVE mg/dL
Hgb urine dipstick: NEGATIVE
Ketones, ur: 5 mg/dL — AB
LEUKOCYTES UA: NEGATIVE
Nitrite: NEGATIVE
PROTEIN: 100 mg/dL — AB
Specific Gravity, Urine: 1.029 (ref 1.005–1.030)
pH: 5 (ref 5.0–8.0)

## 2017-11-14 LAB — CBC
HEMATOCRIT: 42.5 % (ref 36.0–46.0)
HEMOGLOBIN: 14.7 g/dL (ref 12.0–15.0)
MCH: 34.1 pg — AB (ref 26.0–34.0)
MCHC: 34.6 g/dL (ref 30.0–36.0)
MCV: 98.6 fL (ref 78.0–100.0)
Platelets: 311 10*3/uL (ref 150–400)
RBC: 4.31 MIL/uL (ref 3.87–5.11)
RDW: 13.1 % (ref 11.5–15.5)
WBC: 11.9 10*3/uL — ABNORMAL HIGH (ref 4.0–10.5)

## 2017-11-14 LAB — ACETAMINOPHEN LEVEL

## 2017-11-14 LAB — I-STAT BETA HCG BLOOD, ED (MC, WL, AP ONLY): I-stat hCG, quantitative: <5 m[IU]/mL (ref <5)

## 2017-11-14 LAB — SALICYLATE LEVEL

## 2017-11-14 LAB — ETHANOL: Alcohol, Ethyl (B): <10 mg/dL (ref <10)

## 2017-11-14 MED ORDER — HALOPERIDOL LACTATE 2 MG/ML PO CONC
1.0000 mg | Freq: Two times a day (BID) | ORAL | Status: DC
  Administered 2017-11-14: 1 mg via ORAL
  Filled 2017-11-14: qty 0.5

## 2017-11-14 MED ORDER — IBUPROFEN 200 MG PO TABS
400.0000 mg | ORAL_TABLET | Freq: Once | ORAL | Status: AC
Start: 2017-11-14 — End: 2017-11-14
  Administered 2017-11-14: 400 mg via ORAL
  Filled 2017-11-14: qty 2

## 2017-11-14 MED ORDER — LITHIUM CARBONATE ER 300 MG PO TBCR
300.0000 mg | EXTENDED_RELEASE_TABLET | Freq: Two times a day (BID) | ORAL | Status: DC
Start: 2017-11-14 — End: 2017-11-14
  Administered 2017-11-14: 300 mg via ORAL
  Filled 2017-11-14: qty 1

## 2017-11-14 MED ORDER — POTASSIUM CHLORIDE CRYS ER 20 MEQ PO TBCR
40.0000 meq | EXTENDED_RELEASE_TABLET | Freq: Once | ORAL | Status: AC
  Administered 2017-11-14: 40 meq via ORAL
  Filled 2017-11-14: qty 2

## 2017-11-14 MED ORDER — HALOPERIDOL 1 MG PO TABS
1.0000 mg | ORAL_TABLET | Freq: Two times a day (BID) | ORAL | Status: DC
  Filled 2017-11-14: qty 1

## 2017-11-14 MED ORDER — TRAZODONE HCL 100 MG PO TABS: 100.0000 mg | ORAL_TABLET | Freq: Every day | ORAL | Status: DC
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
## 2017-11-14 NOTE — ED Notes (Signed)
Urine collected for labs.

## 2017-11-14 NOTE — BH Assessment (Signed)
[facility] Assessment Progress Note  Per Juanetta BeetsJacqueline Norman, DO, this pt does not require psychiatric hospitalization at this time.  [facility] continue treatment with [facility].  This has been included in pt's discharge instructions.  Pt's nurse, Morrie Sheldonshley, has been notified.  Doylene Canninghomas Tamsen Reist, MA Triage Specialist [phone]

## 2017-11-14 NOTE — ED Notes (Signed)
Pt changed herself into paper scrubs and placed her clothes and pouch into a patient belongings bag. Belongings bag placed in [address]. Pt would not allow RN to inventory belongings bag.

## 2017-11-14 NOTE — ED Notes (Signed)
NO RX GIVEN. ENVISIONS RN HERE TO PICK PT

## 2017-11-14 NOTE — ED Notes (Signed)
Pt transported via WC for CXR.

## 2017-11-14 NOTE — ED Notes (Signed)
Pt. returned from XR. 

## 2017-11-14 NOTE — ED Notes (Signed)
PT STATES MOTHER WILL BE PICKING HER UP HOWEVER "WILL BE A MINUTE" "SHE HAS TO COME FROM [place]".

## 2017-11-14 NOTE — [facility] Counselor (Signed)
  Dr. Sharma CovertNorman and Nanine MeansJamison Lord, DNP, completed psychiatric rounds on patient this a.m. Per report patient does not meet criteria for inpatient and has been recommended for discharge.

## 2017-11-14 NOTE — ED Provider Notes (Signed)
[facility]-EMERGENCY DEPT Provider Note   CSN: [id number] Arrival date & time: 11/14/17  0112     History   Chief Complaint Chief Complaint  Patient presents with  . Manic Behavior    HPI Amy [name] is a 22 y.o. female.  Patient presents to the emergency department with a chief complaint of manic behavior.  Level 5 caveat applies secondary to manic state.  The history is provided by the patient and medical records. No language interpreter was used.    Past Medical History:  Diagnosis Date  . Cannabis abuse   . Depression   . 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 Schizo affective schizophrenia [facility](HCC)     Patient Active Problem List   Diagnosis Date Noted  . 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 Substance abuse (HCC)   . Cocaine abuse with cocaine-induced mood disorder (HCC) 12/16/2016  . Bipolar disorder, curr episode mixed, severe, with psychotic features (HCC) 10/25/2016  . Cocaine use disorder, mild, abuse (HCC) 10/25/2016  . Cannabis use disorder, severe, dependence (HCC) 10/25/2016  . Insomnia   . Anxiety state   . Cocaine abuse (HCC) 07/04/2015  . Cannabis abuse, continuous 07/02/2015  . Non compliance with medical treatment 06/18/2015  . Bipolar affective disorder (HCC) 03/02/2015    History reviewed. No pertinent surgical history.   OB History   None      Home Medications    Prior to Admission medications   Medication Sig Start Date End Date Taking? Authorizing Provider  ARIPiprazole ER (ABILIFY MAINTENA) 400 MG PRSY prefilled syringe Inject 400 mg into the muscle every 30 (thirty) days.    [provider]  ARIPiprazole ER 400 MG SRER Inject 400 mg into the muscle every 28 (twenty-eight) days. (Due 11-28-16): For mood control Patient not taking: Reported on 10/27/2017 11/28/16   Armandina StammerNwoko, Agnes I, NP  haloperidol (HALDOL) 10 MG tablet Take 1 tablet (10 mg total) by mouth 2 (two) times  daily. Patient not taking: Reported on 11/02/2017 10/28/17   Laveda AbbeParks, Laurie Britton, NP  ibuprofen (ADVIL,MOTRIN) 800 MG tablet Take 1 tablet (800 mg total) by mouth 2 (two) times daily. With food 11/02/17   Arthor CaptainHarris, Abigail, PA-C  lithium carbonate (LITHOBID) 300 MG CR tablet Take 1 tablet (300 mg total) by mouth every 12 (twelve) hours. Patient not taking: Reported on 11/02/2017 10/28/17   Laveda AbbeParks, Laurie Britton, NP  traZODone (DESYREL) 100 MG tablet Take 1 tablet (100 mg total) by mouth at bedtime. Patient not taking: Reported on 11/02/2017 10/28/17   Laveda AbbeParks, Laurie Britton, NP  trihexyphenidyl (ARTANE) 2 MG tablet Take 0.5 tablets (1 mg total) by mouth daily with supper. Patient not taking: Reported on 11/02/2017 10/28/17   Laveda AbbeParks, Laurie Britton, NP    Family History Family History  Problem Relation Age of Onset  . Mental illness Neg Hx     Social History Social History   Tobacco Use  . Smoking status: Current Every Day Smoker    Types: Cigarettes  . Smokeless tobacco: Never Used  Substance Use Topics  . Alcohol use: No  . Drug use: Yes    Types: Marijuana    Comment: denies     Allergies   Cogentin [benztropine]; Depakote [divalproex sodium]; Risperidone; Valproic acid; and Penicillins   Review of Systems Review of Systems  Unable to perform ROS: Psychiatric disorder     Physical Exam Updated Vital Signs BP (!) 152/100 (BP Location: Right Arm)  Pulse 76   Temp 99.9 F (37.7 C) (Oral)   Resp 20   LMP 10/31/2017   SpO2 97%   Physical Exam  Constitutional: She is oriented to person, place, and time. She appears well-developed and well-nourished.  HENT:  Head: Normocephalic and atraumatic.  Eyes: Pupils are equal, round, and reactive to light. Conjunctivae and EOM are normal.  Neck: Normal range of motion. Neck supple.  Cardiovascular: Normal rate and regular rhythm. Exam reveals no gallop and no friction rub.  No murmur heard. Pulmonary/Chest: Effort normal and breath  sounds normal. No respiratory distress. She has no wheezes. She has no rales. She exhibits no tenderness.  Abdominal: Soft. Bowel sounds are normal. She exhibits no distension and no mass. There is no tenderness. There is no rebound and no guarding.  Musculoskeletal: Normal range of motion. She exhibits no edema or tenderness.  Neurological: She is alert and oriented to person, place, and time.  Skin: Skin is warm and dry.  Psychiatric:  Manic, tangential speaking  Nursing note and vitals reviewed.    ED Treatments / Results  Labs (all labs ordered are listed, but only abnormal results are displayed) Labs Reviewed  COMPREHENSIVE METABOLIC PANEL  ETHANOL  CBC  RAPID URINE DRUG SCREEN, HOSP PERFORMED  ACETAMINOPHEN LEVEL  SALICYLATE LEVEL  URINALYSIS, ROUTINE W REFLEX MICROSCOPIC  I-STAT BETA HCG BLOOD, ED ([facility], [facility], [facility] ONLY)    EKG None  Radiology No results found.  Procedures Procedures (including critical care time)  Medications Ordered in ED Medications  ibuprofen (ADVIL,MOTRIN) tablet 400 mg (has no administration in time range)     Initial Impression / Assessment and Plan / ED Course  I have reviewed the triage vital signs and the nursing notes.  Pertinent labs & imaging results that were available during my care of the patient were reviewed by me and considered in my medical decision making (see chart for details).     Patient with manic behavior.  She has rapid tangential speaking.  Physical exam is reassuring.  Laboratory work-up is reassuring.  TTS recommends inpatient treatment.  Final Clinical Impressions(s) / ED Diagnoses   Final diagnoses:  Manic behavior [facility])    ED Discharge Orders    None       Roxy Horseman, PA-C 11/14/17 0419    Palumbo, April, MD 11/14/17 1610
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
## 2017-11-14 NOTE — Progress Notes (Signed)
TTS consulted with Nira ConnJason Berry, NP who recommends inpt treatment. EDP Roxy HorsemanBrowning, Robert, PA-C and pt's nurse Mamie LaurelMerritt, Carolyn K, RN have been advised. [facility] seek [facility]  Princess BruinsAquicha Owyn Raulston, MSW, LCSW Therapeutic Triage Specialist  [phone]

## 2017-11-14 NOTE — ED Notes (Signed)
Pt wanded by security. 

## 2017-11-14 NOTE — ED Notes (Signed)
MOTHER NOT ABLE TO PICK UP PT AT THIS TIME. PT REQUESTING TO CALL [facility] FOR A RIDE. CURRENTLY ON PHONE

## 2017-11-14 NOTE — ED Notes (Signed)
Blood drawn for labs.

## 2017-11-14 NOTE — Discharge Instructions (Signed)
For your behavioral health needs, you are advised to continue treatment with the [facility] ACT Team:       [facility]      [address]      [place], [place] [address]      [phone] 

## 2017-11-14 NOTE — ED Notes (Signed)
Pt admits to smoking marijuana tonight but denies any drinking

## 2017-11-14 NOTE — ED Notes (Signed)
TTS at bedside. 

## 2017-12-06 ENCOUNTER — Other Ambulatory Visit: Payer: Self-pay

## 2017-12-06 ENCOUNTER — Emergency Department (HOSPITAL_BASED_OUTPATIENT_CLINIC_OR_DEPARTMENT_OTHER): Payer: Self-pay

## 2017-12-06 ENCOUNTER — Emergency Department (HOSPITAL_BASED_OUTPATIENT_CLINIC_OR_DEPARTMENT_OTHER)
Admission: EM | Admit: 2017-12-06 | Discharge: 2017-12-06 | Disposition: A | Discharge status: Home / Self Care | Payer: Self-pay | Attending: Emergency Medicine | Admitting: Emergency Medicine

## 2017-12-06 ENCOUNTER — Encounter (HOSPITAL_BASED_OUTPATIENT_CLINIC_OR_DEPARTMENT_OTHER): Payer: Self-pay | Admitting: *Deleted

## 2017-12-06 DIAGNOSIS — F17210 Nicotine dependence, cigarettes, uncomplicated: Secondary | ICD-10-CM | POA: Insufficient documentation

## 2017-12-06 DIAGNOSIS — Y939 Activity, unspecified: Secondary | ICD-10-CM | POA: Insufficient documentation

## 2017-12-06 DIAGNOSIS — Y929 Unspecified place or not applicable: Secondary | ICD-10-CM | POA: Insufficient documentation

## 2017-12-06 DIAGNOSIS — Y999 Unspecified external cause status: Secondary | ICD-10-CM | POA: Insufficient documentation

## 2017-12-06 DIAGNOSIS — R451 Restlessness and agitation: Secondary | ICD-10-CM | POA: Insufficient documentation

## 2017-12-06 DIAGNOSIS — F250 Schizoaffective disorder, bipolar type: Secondary | ICD-10-CM | POA: Insufficient documentation

## 2017-12-06 DIAGNOSIS — W19XXXA Unspecified fall, initial encounter: Secondary | ICD-10-CM | POA: Insufficient documentation

## 2017-12-06 DIAGNOSIS — F419 Anxiety disorder, unspecified: Secondary | ICD-10-CM | POA: Insufficient documentation

## 2017-12-06 DIAGNOSIS — Z046 Encounter for general psychiatric examination, requested by authority: Secondary | ICD-10-CM | POA: Insufficient documentation

## 2017-12-06 DIAGNOSIS — Z9114 Patient's other noncompliance with medication regimen: Secondary | ICD-10-CM | POA: Insufficient documentation

## 2017-12-06 DIAGNOSIS — R600 Localized edema: Secondary | ICD-10-CM | POA: Insufficient documentation

## 2017-12-06 DIAGNOSIS — M25562 Pain in left knee: Secondary | ICD-10-CM | POA: Insufficient documentation

## 2017-12-06 MED ORDER — HALOPERIDOL 5 MG PO TABS
5.0000 mg | ORAL_TABLET | Freq: Once | ORAL | Status: AC
  Administered 2017-12-06: 5 mg via ORAL
  Filled 2017-12-06: qty 1

## 2017-12-06 NOTE — Progress Notes (Addendum)
Patient is seen by me via tele-psych today and I have consulted with Dr. Lucianne MussKumar.  Patient does appear to be sleepy this morning and is probably due to her receiving and the dose of Haldol this morning.  Patient denies any SI/HI/AVH and contracts for safety.  She states that she plans to go live with her aunt where it is safe and away from inappropriate atmosphere with her parents.  It was reported that her parents do use crack cocaine and that she is trying to get away from it.  Patient states that she goes to [facility] for treatment and that they will be assisting her with her medications.  Patient does not meet criteria for inpatient treatment and is cleared by psychiatry.  I have contacted Dr. Clarene DukeLittle in the ED and notified her of our recommendation.

## 2017-12-06 NOTE — ED Provider Notes (Signed)
[facility] EMERGENCY DEPARTMENT Provider Note   CSN: [id number] Arrival date & time: 12/06/17  6962     History   Chief Complaint Chief Complaint  Patient presents with  . Knee Pain  . Psychiatric Evaluation    HPI Amy [name] is a 22 y.o. female.  21yo F w/ PMH including bipolar d/o w/ psychotic features, substance abuse, depression who p/w knee pain.  Patient states that she injured her left knee several weeks ago and last night fell and injured it again.  She reports constant, moderate to severe pain on her medial left knee that is worse when she walks but she is able to ambulate.  No therapies tried.  Patient also notes that she has been off of all of her medications for several weeks because her mom will not pay for the medications.  She reports significant stress at home, stating that she has been charged with trespassing at her mom's house and has nowhere to stay.  She requests Haldol to make her feel better.  The history is provided by the patient.    Past Medical History:  Diagnosis Date  . Cannabis abuse   . Depression   . 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 Schizo affective schizophrenia [facility])     Patient Active Problem List   Diagnosis Date Noted  . 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 Substance induced mood disorder (HCC) 11/14/2017  . 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 Substance abuse (HCC)   . Cocaine abuse with cocaine-induced mood disorder (HCC) 12/16/2016  . Bipolar disorder, curr episode mixed, severe, with psychotic features (HCC) 10/25/2016  . Cocaine use disorder, mild, abuse (HCC) 10/25/2016  . Cannabis use disorder, severe, dependence (HCC) 10/25/2016  . Insomnia   . Anxiety state   . Cocaine abuse (HCC) 07/04/2015  . Cannabis abuse, continuous 07/02/2015  . Non compliance with medical treatment 06/18/2015    History reviewed. No pertinent surgical history.   OB History   None       Home Medications    Prior to Admission medications   Medication Sig Start Date End Date Taking? Authorizing Provider  ARIPiprazole ER 400 MG SRER Inject 400 mg into the muscle every 28 (twenty-eight) days. (Due 11-28-16): For mood control Patient not taking: Reported on 10/27/2017 11/28/16   Armandina Stammer I, NP  ibuprofen (ADVIL,MOTRIN) 800 MG tablet Take 1 tablet (800 mg total) by mouth 2 (two) times daily. With food Patient not taking: Reported on 11/14/2017 11/02/17   Arthor Captain, PA-C  lithium carbonate (LITHOBID) 300 MG CR tablet Take 1 tablet (300 mg total) by mouth every 12 (twelve) hours. Patient not taking: Reported on 11/02/2017 10/28/17   Laveda Abbe, NP  traZODone (DESYREL) 100 MG tablet Take 1 tablet (100 mg total) by mouth at bedtime. Patient not taking: Reported on 11/02/2017 10/28/17   Laveda Abbe, NP    Family History Family History  Problem Relation Age of Onset  . Mental illness Neg Hx     Social History Social History   Tobacco Use  . Smoking status: Current Every Day Smoker    Types: Cigarettes  . Smokeless tobacco: Never Used  Substance Use Topics  . Alcohol use: No  . Drug use: Yes    Types: Marijuana    Comment: denies     Allergies   Penicillins; Cogentin [benztropine]; Depakote [divalproex sodium]; Risperidone; and Valproic acid   Review of Systems Review of Systems  Unable to perform ROS: Psychiatric disorder     Physical Exam Updated Vital Signs BP 106/76 (BP Location: Right Arm)   Pulse 91   Temp 98.5 F (36.9 C) (Oral)   Resp 18   LMP 11/20/2017   SpO2 97%   Physical Exam  Constitutional: She is oriented to person, place, and time. She appears well-developed and well-nourished.  HENT:  Head: Normocephalic and atraumatic.  Eyes: Conjunctivae are normal.  Neck: Neck supple.  Musculoskeletal: Normal range of motion. She exhibits edema and tenderness. She exhibits no deformity.  Normal ROM L knee with no  effusion or edema; tenderness of medial knee, no joint laxity, negative ant/post drawer signs  Neurological: She is alert and oriented to person, place, and time.  Normal gait  Skin: Skin is warm and dry.  Psychiatric: Her mood appears anxious. Her affect is labile. Her speech is rapid and/or pressured. She is agitated.  Tearful, crying with loud, rapid speech  Nursing note and vitals reviewed.    ED Treatments / Results  Labs (all labs ordered are listed, but only abnormal results are displayed) Labs Reviewed  PREGNANCY, URINE  RAPID URINE DRUG SCREEN, HOSP PERFORMED    EKG None  Radiology Dg Knee Complete 4 Views Left  Result Date: 12/06/2017 CLINICAL DATA:  Pain following twisting injury while running EXAM: LEFT KNEE - COMPLETE 4+ VIEW COMPARISON:  None. FINDINGS: Frontal, lateral, and bilateral oblique views were obtained. No fracture or dislocation. No joint effusion. Joint spaces appear normal. No erosive change. IMPRESSION: No fracture or dislocation. No joint effusion. No appreciable arthropathy. Electronically Signed   By: Bretta BangWilliam  Woodruff III M.D.   On: 12/06/2017 08:37    Procedures Procedures (including critical care time)  Medications Ordered in ED Medications  haloperidol (HALDOL) tablet 5 mg (5 mg Oral Given 12/06/17 0746)     Initial Impression / Assessment and Plan / ED Course  I have reviewed the triage vital signs and the nursing notes.  Pertinent labs & imaging results that were available during my care of the patient were reviewed by me and considered in my medical decision making (see chart for details).    XR knee negative. I am concerned about patient's mania symptoms off her medications. She has a very labile mood and I question how well she is functioning at home. Contacted TTS.  TTS evaluated pt as well as Reola Calkinsravis Money, FNP with psychiatry. They feel pt is safe for discharge w/ f/u at [facility] and she does not meet criteria for  inpatient treatment. She has no SI/HI or severe psychosis interfering w/ ADLs. Recommended f/u with clinic for ongoing treatment. Discussed supportive measures for knee. Pt voiced understanding.   Final Clinical Impressions(s) / ED Diagnoses   Final diagnoses:  Acute pain of left knee    ED Discharge Orders    None       Tajon Moring, Ambrose Finlandachel Morgan, MD 12/06/17 1108

## 2017-12-06 NOTE — ED Notes (Signed)
Pt mother is not here, pt states she feels fine to go home in a taxi. [employer] called to take pt to her home address, voucher given.

## 2017-12-06 NOTE — ED Notes (Signed)
Pt assisted to call her mother for ride home. Mom states she will be here in 30 minutes to pick up Amy [name].

## 2017-12-06 NOTE — ED Notes (Signed)
TTS eval in progress  °
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
## 2018-01-30 ENCOUNTER — Encounter (HOSPITAL_COMMUNITY): Payer: Self-pay | Admitting: Emergency Medicine

## 2018-01-30 ENCOUNTER — Emergency Department (HOSPITAL_COMMUNITY)
Admission: EM | Admit: 2018-01-30 | Discharge: 2018-01-31 | Disposition: A | Discharge status: Home / Self Care | Payer: Self-pay | Attending: Emergency Medicine | Admitting: Emergency Medicine

## 2018-01-30 DIAGNOSIS — F419 Anxiety disorder, unspecified: Secondary | ICD-10-CM | POA: Insufficient documentation

## 2018-01-30 DIAGNOSIS — F1520 Other stimulant dependence, uncomplicated: Secondary | ICD-10-CM | POA: Insufficient documentation

## 2018-01-30 DIAGNOSIS — R45851 Suicidal ideations: Secondary | ICD-10-CM | POA: Insufficient documentation

## 2018-01-30 DIAGNOSIS — F17210 Nicotine dependence, cigarettes, uncomplicated: Secondary | ICD-10-CM | POA: Insufficient documentation

## 2018-01-30 DIAGNOSIS — F251 Schizoaffective disorder, depressive type: Secondary | ICD-10-CM | POA: Insufficient documentation

## 2018-01-30 DIAGNOSIS — F1414 Cocaine abuse with cocaine-induced mood disorder: Secondary | ICD-10-CM | POA: Diagnosis present

## 2018-01-30 DIAGNOSIS — F1410 Cocaine abuse, uncomplicated: Secondary | ICD-10-CM | POA: Insufficient documentation

## 2018-01-30 DIAGNOSIS — F1210 Cannabis abuse, uncomplicated: Secondary | ICD-10-CM | POA: Insufficient documentation

## 2018-01-30 LAB — COMPREHENSIVE METABOLIC PANEL
ALBUMIN: 4.1 g/dL (ref 3.5–5.0)
ALK PHOS: 49 U/L (ref 38–126)
ALT: 10 U/L (ref 0–44)
ANION GAP: 7 (ref 5–15)
AST: 15 U/L (ref 15–41)
BUN: 6 mg/dL (ref 6–20)
CALCIUM: 9.2 mg/dL (ref 8.9–10.3)
CO2: 22 mmol/L (ref 22–32)
CREATININE: 0.75 mg/dL (ref 0.44–1.00)
Chloride: 110 mmol/L (ref 98–111)
GFR calc Af Amer: >60 mL/min (ref >60)
GFR calc non Af Amer: >60 mL/min (ref >60)
GLUCOSE: 90 mg/dL (ref 70–99)
Potassium: 4.2 mmol/L (ref 3.5–5.1)
SODIUM: 139 mmol/L (ref 135–145)
Total Bilirubin: 0.5 mg/dL (ref 0.3–1.2)
Total Protein: 7 g/dL (ref 6.5–8.1)

## 2018-01-30 LAB — ETHANOL: Alcohol, Ethyl (B): <10 mg/dL (ref <10)

## 2018-01-30 LAB — CBC
HCT: 40.7 % (ref 36.0–46.0)
Hemoglobin: 13.3 g/dL (ref 12.0–15.0)
MCH: 33.2 pg (ref 26.0–34.0)
MCHC: 32.7 g/dL (ref 30.0–36.0)
MCV: 101.5 fL — ABNORMAL HIGH (ref 78.0–100.0)
Platelets: 320 10*3/uL (ref 150–400)
RBC: 4.01 MIL/uL (ref 3.87–5.11)
RDW: 12.5 % (ref 11.5–15.5)
WBC: 7.3 10*3/uL (ref 4.0–10.5)

## 2018-01-30 LAB — I-STAT BETA HCG BLOOD, ED (MC, WL, AP ONLY): I-stat hCG, quantitative: <5 m[IU]/mL (ref <5)

## 2018-01-30 LAB — RAPID URINE DRUG SCREEN, HOSP PERFORMED
Amphetamines: NOT DETECTED
Barbiturates: NOT DETECTED
Benzodiazepines: NOT DETECTED
COCAINE: NOT DETECTED
Opiates: NOT DETECTED
Tetrahydrocannabinol: POSITIVE — AB

## 2018-01-30 LAB — SALICYLATE LEVEL: Salicylate Lvl: <7 mg/dL (ref 2.8–30.0)

## 2018-01-30 LAB — ACETAMINOPHEN LEVEL

## 2018-01-30 MED ORDER — LORAZEPAM 2 MG/ML IJ SOLN
2.00 | INTRAMUSCULAR | Status: DC
Start: ? — End: 2018-01-30

## 2018-01-30 MED ORDER — PROMETHAZINE HCL 25 MG PO TABS
25.00 | ORAL_TABLET | ORAL | Status: DC
Start: ? — End: 2018-01-30

## 2018-01-30 MED ORDER — GENERIC EXTERNAL MEDICATION
5.00 | Status: DC
Start: ? — End: 2018-01-30

## 2018-01-30 MED ORDER — ACETAMINOPHEN 325 MG PO TABS
650.00 | ORAL_TABLET | ORAL | Status: DC
Start: ? — End: 2018-01-30

## 2018-01-30 MED ORDER — TRAZODONE HCL 100 MG PO TABS
100.00 | ORAL_TABLET | ORAL | Status: DC
Start: ? — End: 2018-01-30

## 2018-01-30 MED ORDER — TRIHEXYPHENIDYL HCL 2 MG PO TABS
2.00 | ORAL_TABLET | ORAL | Status: DC
Start: 2018-01-28 — End: 2018-01-30

## 2018-01-30 MED ORDER — HYDROXYZINE HCL 25 MG PO TABS
50.00 | ORAL_TABLET | ORAL | Status: DC
Start: ? — End: 2018-01-30

## 2018-01-30 MED ORDER — METHOCARBAMOL 750 MG PO TABS
1500.00 | ORAL_TABLET | ORAL | Status: DC
Start: ? — End: 2018-01-30

## 2018-01-30 MED ORDER — HALOPERIDOL 5 MG PO TABS
5.00 | ORAL_TABLET | ORAL | Status: DC
Start: 2018-01-28 — End: 2018-01-30

## 2018-01-30 MED ORDER — LORAZEPAM 1 MG PO TABS
1.00 | ORAL_TABLET | ORAL | Status: DC
Start: ? — End: 2018-01-30

## 2018-01-30 MED ORDER — HALOPERIDOL 5 MG PO TABS
5.00 | ORAL_TABLET | ORAL | Status: DC
Start: ? — End: 2018-01-30

## 2018-01-30 MED ORDER — NICOTINE 14 MG/24HR TD PT24
1.00 | MEDICATED_PATCH | TRANSDERMAL | Status: DC
Start: 2018-01-29 — End: 2018-01-30

## 2018-01-30 MED ORDER — CLONIDINE HCL 0.1 MG PO TABS
0.10 | ORAL_TABLET | ORAL | Status: DC
Start: ? — End: 2018-01-30

## 2018-01-30 MED ORDER — PROMETHAZINE HCL 25 MG/ML IJ SOLN
25.00 | INTRAMUSCULAR | Status: DC
Start: ? — End: 2018-01-30

## 2018-01-30 MED ORDER — ZIPRASIDONE MESYLATE 20 MG IM SOLR
20.00 | INTRAMUSCULAR | Status: DC
Start: ? — End: 2018-01-30

## 2018-01-30 MED ORDER — NICOTINE POLACRILEX 2 MG MT GUM
2.00 | CHEWING_GUM | OROMUCOSAL | Status: DC
Start: ? — End: 2018-01-30

## 2018-01-30 NOTE — ED Notes (Signed)
Staffing made aware of need for sitter 
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
## 2018-01-30 NOTE — ED Provider Notes (Signed)
[facility] EMERGENCY DEPARTMENT Provider Note   CSN: [id number] Arrival date & time: 01/30/18  1411     History   Chief Complaint Chief Complaint  Patient presents with  . Suicidal    HPI Amy [name] is a 22 y.o. female.  HPI   22 year old female presents today with suicidal ideations and hallucinations.  Patient notes that she is upset with the family, feels that she is not receiving the attention that she deserves.  Patient notes that she has a history of suicide attempts noting she tried to cut her wrist previously, notes that she has been cutting her right wrist.  She felt suicidal this morning, felt suicidal several days ago noting she wanted to stab her belly with a knife but did not do this as she thought it would hurt too much.  She notes a history of drug use including methamphetamines alcohol and marijuana.  She notes being in touched with "beings" when she gets " to elevated".  She denies any thoughts of harming anyone. (Nursing note reports she ran into the emergency room reporting she almost killed her family.)   Past Medical History:  Diagnosis Date  . Cannabis abuse   . Depression   . 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 Schizo affective schizophrenia [facility])     Patient Active Problem List   Diagnosis Date Noted  . 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 Substance induced mood disorder (HCC) 11/14/2017  . 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 Substance abuse (HCC)   . Cocaine abuse with cocaine-induced mood disorder (HCC) 12/16/2016  . Bipolar disorder, curr episode mixed, severe, with psychotic features (HCC) 10/25/2016  . Cocaine use disorder, mild, abuse (HCC) 10/25/2016  . Cannabis use disorder, severe, dependence (HCC) 10/25/2016  . Insomnia   . Anxiety state   . Cocaine abuse (HCC) 07/04/2015  . Cannabis abuse, continuous 07/02/2015  . Non compliance with medical treatment 06/18/2015    History  reviewed. No pertinent surgical history.   OB History   None      Home Medications    Prior to Admission medications   Medication Sig Start Date End Date Taking? Authorizing Provider  ARIPiprazole ER 400 MG SRER Inject 400 mg into the muscle every 28 (twenty-eight) days. (Due 11-28-16): For mood control Patient not taking: Reported on 10/27/2017 11/28/16   Armandina Stammer I, NP  ibuprofen (ADVIL,MOTRIN) 800 MG tablet Take 1 tablet (800 mg total) by mouth 2 (two) times daily. With food Patient not taking: Reported on 11/14/2017 11/02/17   Arthor Captain, PA-C  lithium carbonate (LITHOBID) 300 MG CR tablet Take 1 tablet (300 mg total) by mouth every 12 (twelve) hours. Patient not taking: Reported on 11/02/2017 10/28/17   Laveda Abbe, NP  traZODone (DESYREL) 100 MG tablet Take 1 tablet (100 mg total) by mouth at bedtime. Patient not taking: Reported on 11/02/2017 10/28/17   Laveda Abbe, NP    Family History Family History  Problem Relation Age of Onset  . Mental illness Neg Hx     Social History Social History   Tobacco Use  . Smoking status: Current Every Day Smoker    Types: Cigarettes  . Smokeless tobacco: Never Used  Substance Use Topics  . Alcohol use: No  . Drug use: Yes    Types: Marijuana    Comment: denies     Allergies   Penicillins; Cogentin [benztropine]; Depakote [divalproex sodium]; Risperidone; and Valproic acid  Review of Systems Review of Systems  All other systems reviewed and are negative.   Physical Exam Updated Vital Signs BP 119/60 (BP Location: Right Arm)   Pulse 76   Temp 98 F (36.7 C) (Oral)   Resp 14   SpO2 100%   Physical Exam  Constitutional: She is oriented to person, place, and time. She appears well-developed and well-nourished.  HENT:  Head: Normocephalic and atraumatic.  Eyes: Pupils are equal, round, and reactive to light. Conjunctivae are normal. Right eye exhibits no discharge. Left eye exhibits no discharge.  No scleral icterus.  Neck: Normal range of motion. No JVD present. No tracheal deviation present.  Cardiovascular: Normal rate, regular rhythm, normal heart sounds and intact distal pulses. Exam reveals no gallop and no friction rub.  No murmur heard. Pulmonary/Chest: Effort normal and breath sounds normal. No stridor. No respiratory distress. She has no wheezes.  Neurological: She is alert and oriented to person, place, and time. Coordination normal.  Psychiatric: She has a normal mood and affect. Her behavior is normal. Judgment and thought content normal.  Nursing note and vitals reviewed.   ED Treatments / Results  Labs (all labs ordered are listed, but only abnormal results are displayed) Labs Reviewed  ACETAMINOPHEN LEVEL - Abnormal; Notable for the following components:      Result Value   Acetaminophen (Tylenol), Serum <10 (*)    All other components within normal limits  CBC - Abnormal; Notable for the following components:   MCV 101.5 (*)    All other components within normal limits  COMPREHENSIVE METABOLIC PANEL  ETHANOL  SALICYLATE LEVEL  RAPID URINE DRUG SCREEN, HOSP PERFORMED  I-STAT BETA HCG BLOOD, ED ([facility], [facility], [facility] ONLY)    EKG None  Radiology No results found.  Procedures Procedures (including critical care time)  Medications Ordered in ED Medications - No data to display   Initial Impression / Assessment and Plan / ED Course  I have reviewed the triage vital signs and the nursing notes.  Pertinent labs & imaging results that were available during my care of the patient were reviewed by me and considered in my medical decision making (see chart for details).     Labs: I stat beta-hCG, CMP, ethanol, salicylate, acetaminophen, CBC  Imaging:  Consults:  Therapeutics:  Discharge Meds:   Assessment/Plan: 22 year old female presents today with suicidal ideations and hallucinations.  Patient is medically cleared awaiting TTS evaluation.    Final  Clinical Impressions(s) / ED Diagnoses   Final diagnoses:  Suicidal ideation    ED Discharge Orders    None       Rosalio Loud 01/30/18 1812    Maia Plan, MD 01/31/18 1319

## 2018-01-30 NOTE — BH Assessment (Addendum)
Tele Assessment Note   Patient Name: Amy [name] MRN: [id number] Referring Physician: Curlene Dolphin Location of Patient: [facility] ED Location of Provider: Behavioral Health TTS Department  Amy [name] is an 22 y.o. female.  The pt came in to the ED and according to a previous note she stated,"I just almost killed my family and myself".  The pt now states that isn't what she stated.  The pt stated she told the RN she was going to kill herself because of her family.  She denies wanting to kill herself now.  She stated her father doesn't listen to her and her mother has now become similar to her father.  For most of the assessment, the pt spoke of bizarre things such as being visited by aliens and being able to hear other people's telecommunications.  The pt also stated she has seen other beings in the past.  The pt spoke rapidly and was tangential.  According to the pt she is currently not taking any medication .  She is going to [facility] and stated they are not helping her.  She was recently in [facility] and was released 01/28/2018.  It appears she went in after using crystal meth.  The pt denies using crystal meth since being discharged.  Prior to going to [facility], the pt was in jail for trespassing.  The pt stated she was trying to show someone a DVD at 3 AM.     The pt lives with her parents and she does not get along with them.  She currently denies SI and HI.  According to [place] court calendar the pt has court dates for trespassing (9/27) and possession of a stolen vehicle (9/19).  The pt stated she doesn't sleep well.  She reported she will chew her food and spit out her food.  Pt is dressed in scrubs. She is alert and oriented x4. Pt speaks in a clear tone, at moderate volume and fast pace. Eye contact is good. Pt's mood is pleasant. Thought process is tangentialt.Pt is currently has delusional thought content.?Pt was cooperative throughout assessment.      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 Cannabis abuse   . Depression   . 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 Schizo affective schizophrenia (HCC)     History reviewed. No pertinent surgical history.  Family History:  Family History  Problem Relation Age of Onset  . Mental illness Neg Hx     Social History:  reports that she has been smoking cigarettes. She has never used smokeless tobacco. She reports that she has current or past drug history. Drug: Marijuana. She reports that she does not drink alcohol.  Additional Social History:  Alcohol / Drug Use Pain Medications: See MAR Prescriptions: See MAR Over the Counter: See MAR History of alcohol / drug use?: Yes Longest period of sobriety (when/how long): unknown Negative Consequences of Use: Personal relationships Substance #1 Name of Substance 1: Crystal meth 1 - Last Use / Amount: 01/23/2018 Substance #2 Name of Substance 2: marijuana 2 - Last Use / Amount: 01/29/2018  CIWA: CIWA-Ar BP: 119/60 Pulse Rate: 76 COWS:    Allergies:  Allergies  Allergen Reactions  . Penicillins Rash    Has patient had a PCN reaction causing immediate rash, facial/tongue/throat swelling, SOB or lightheadedness with hypotension: Yes  Has patient had a PCN reaction causing severe rash involving mucus membranes or skin necrosis: No Has patient had a PCN reaction that required hospitalization: No Has patient had a PCN reaction occurring within the last 10 years: Yes If all of the above answers are "NO", then may   . Cogentin [Benztropine] Itching  . Depakote [Divalproex Sodium] Other (See Comments)    Creates feelings of paranoia, some suicidal feelings, and makes her feel that "people are coming after" her  . Risperidone Other (See Comments)    "Makes me cough"   . Valproic Acid Other (See Comments)    Creates paranoia/Per [facility]    Home Medications:  (Not in a hospital admission)  OB/GYN Status:  No LMP recorded.  General Assessment Data Location of Assessment: [facility] ED TTS Assessment: In system Is this a Tele or Face-to-Face Assessment?: Face-to-Face Is this an Initial Assessment or a Re-assessment for this encounter?: Initial Assessment Patient Accompanied by:: [name] Language Other than English: No Living Arrangements: Other (Comment)(home with parents) What gender do you identify as?: Female Marital status: Single Maiden name: Fouche Pregnancy Status: No Living Arrangements: Parent Can pt return to current living arrangement?: Yes Admission Status: Voluntary Is patient capable of signing voluntary admission?: Yes Referral Source: Self/Family/Friend Insurance type: Self pay     Crisis Care Plan Living Arrangements: Parent Legal Guardian: Other:(Self) Name of Psychiatrist: [facility] Name of Therapist: [facility]  Education Status Is patient currently in school?: No Is the patient employed, unemployed or receiving disability?: Unemployed  Risk to self with the past 6 months Suicidal Ideation: No-Not Currently/Within Last 6 Months Has patient been a risk to self within the past 6 months prior to admission? : Yes Suicidal Intent: No Has patient had any suicidal intent within the past 6 months prior to admission? : No Is patient at risk for suicide?: Yes Suicidal Plan?: No-Not Currently/Within Last 6 Months Has patient had any suicidal plan within the past 6 months prior to admission? : Yes Access to Means: Yes Specify Access to Suicidal Means: take pills but denies SI currently What has been your use of drugs/alcohol within the last 12 months?: crystal meth and marijuana use Previous Attempts/Gestures: No How many times?: 0 Other Self Harm Risks: none Triggers for Past Attempts: None known Intentional  Self Injurious Behavior: None Family Suicide History: No Recent stressful life event(s): Conflict (Comment)(arguments with parents) Persecutory voices/beliefs?: No Depression: No Depression Symptoms: Feeling angry/irritable, Insomnia Substance abuse history and/or treatment for substance abuse?: Yes Suicide prevention information given to non-admitted patients: Yes  Risk to Others within the past 6 months Homicidal Ideation: No Does patient have any lifetime risk of violence toward others beyond the six months prior to admission? : No Thoughts of Harm to Others: No Current Homicidal Intent: No Current Homicidal Plan: No Access to Homicidal Means: No Identified Victim: none History of harm to others?: No Assessment of Violence: None Noted Violent Behavior Description: none Does patient have access to weapons?: No Criminal Charges Pending?: No Does patient have a court date: No Is patient on probation?: No  Psychosis Hallucinations: None noted Delusions: Grandiose  Mental Status Report Appearance/Hygiene: Unremarkable Eye Contact: Good Motor Activity: Freedom of movement, Unremarkable Speech: Rapid, Tangential Level of Consciousness: Alert Mood: Labile Affect: Labile Anxiety Level: None Thought Processes: Tangential, Flight of Ideas Judgement: Impaired Orientation: Person, Place, Time, Situation Obsessive Compulsive Thoughts/Behaviors: None  Cognitive Functioning Concentration: Decreased Memory: Recent Intact, Remote Intact Is patient IDD:  No Insight: Poor Impulse Control: Poor Appetite: Fair Have you had any weight changes? : No Change Sleep: Decreased Total Hours of Sleep: 4 Vegetative Symptoms: None  ADLScreening [facility]) Patient's cognitive ability adequate to safely complete daily activities?: Yes Patient able to express need for assistance with ADLs?: Yes Independently performs ADLs?: Yes (appropriate for developmental age)  Prior  Inpatient Therapy Prior Inpatient Therapy: Yes Prior Therapy Dates: 01/2018, 2018, 2017 Prior Therapy Facilty/Provider(s): [facility], [facility] Reason for Treatment: psychosis  Prior Outpatient Therapy Prior Outpatient Therapy: Yes Prior Therapy Dates: current Prior Therapy Facilty/Provider(s): [facility] Reason for Treatment: psychosis Does patient have an ACCT team?: Unknown Does patient have Intensive In-House Services?  : No Does patient have [facility] services? : No Does patient have P4CC services?: No  ADL Screening (condition at time of admission) Patient's cognitive ability adequate to safely complete daily activities?: Yes Patient able to express need for assistance with ADLs?: Yes Independently performs ADLs?: Yes (appropriate for developmental age)       Abuse/Neglect Assessment (Assessment to be complete while patient is alone) Abuse/Neglect Assessment Can Be Completed: Yes Physical Abuse: Denies Verbal Abuse: Denies Sexual Abuse: Denies Exploitation of patient/patient's resources: Denies Self-Neglect: Denies Values / Beliefs Cultural Requests During Hospitalization: None Spiritual Requests During Hospitalization: None Consults Spiritual Care Consult Needed: No Social Work Consult Needed: No Merchant navy officer (For Healthcare) Does Patient Have a Medical Advance Directive?: No          Disposition:  Disposition Initial Assessment Completed for this Encounter: Yes   NP [name] recommends the pt be observed overnight for safety and stabilization.  The pt is to be reassessed by psychiatry in the morning.     This service was provided via telemedicine using a 2-way, interactive audio and video technology.  Names of all persons participating in this telemedicine service and their role in this encounter. Name: Shavonda Risi Role: Pt  Name: Riley Churches Role: TTS  Name:  Role:   Name:  Role:     Ottis Stain 01/30/2018 6:53 PM

## 2018-01-30 NOTE — ED Notes (Signed)
Pt in A hall. Given blanket and sandwich.

## 2018-01-30 NOTE — ED Notes (Signed)
Patient changed in to purple scrubs, updated on POC

## 2018-01-30 NOTE — ED Notes (Signed)
Pt belongings placed in locker 3 in [facility].

## 2018-01-30 NOTE — ED Provider Notes (Signed)
Patient placed in Quick Look pathway, seen and evaluated   Chief Complaint:   HPI:  Amy [name] is a 23 y.o. female who presents to the ED for S/I, H/I. Patient ran into the ED stating that she almost killed her family and herself. Patient is tearful and upset but reports she did not hurt anyone.   ROS: Psych: S/I, H/I  Physical Exam: BP (!) 143/75 (BP Location: Left Arm)   Pulse 74   Temp 98 F (36.7 C) (Oral)   Resp 20   SpO2 99%    Gen: No distress  Neuro: Awake and Alert  Skin: Warm and dry  Psych: tearful     Initiation of care has begun. The patient has been counseled on the process, plan, and necessity for staying for the completion/evaluation, and the remainder of the medical screening examination    Janne Napoleon, NP 01/30/18 1423    Vanetta Mulders, MD 02/02/18 [phone]

## 2018-01-31 ENCOUNTER — Encounter (HOSPITAL_COMMUNITY): Payer: Self-pay | Admitting: Registered Nurse

## 2018-01-31 MED ORDER — LITHIUM CARBONATE ER 300 MG PO TBCR
300.0000 mg | EXTENDED_RELEASE_TABLET | Freq: Two times a day (BID) | ORAL | Status: DC
  Administered 2018-01-31: 300 mg via ORAL
  Filled 2018-01-31: qty 1

## 2018-01-31 MED ORDER — TRAZODONE HCL 100 MG PO TABS: 100.0000 mg | ORAL_TABLET | Freq: Every day | ORAL | Status: DC

## 2018-01-31 MED ORDER — ARIPIPRAZOLE ER 400 MG IM SRER: 400.0000 mg | INTRAMUSCULAR | Status: DC

## 2018-01-31 NOTE — ED Notes (Signed)
Pt stats no to suicidal questions but continue until provider dc.

## 2018-01-31 NOTE — ED Notes (Signed)
D/c reviewed with patient, encouraged to follow up with resources, all belongings returned to patient.

## 2018-01-31 NOTE — ED Notes (Signed)
Pt oob to bathroom with steady gait. 

## 2018-01-31 NOTE — ED Notes (Signed)
Pt states she will not take abilify. Dr. Penne LashIssacs aware.

## 2018-01-31 NOTE — Consult Note (Signed)
Telepsych Consultation   Reason for Consult:  Suicidal/homicidal ideation Referring Physician:  Okey Regal, PA-C Location of Patient: [facility] Location of Provider: [facility]  Patient Identification: Amy [name] MRN:  [id number] Principal Diagnosis: Cocaine abuse with cocaine-induced mood disorder [facility]) Diagnosis:   Patient Active Problem List   Diagnosis Date Noted  . Amphetamine abuse ([place]) [F15.10] 11/14/2017  . Substance induced mood disorder ([place]) [F19.94] 11/14/2017  . 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 Substance abuse ([place]) [F19.10]   . Cocaine abuse with cocaine-induced mood disorder ([place]) [F14.14] 12/16/2016  . Bipolar disorder, curr episode mixed, severe, with psychotic features ([place]) [F31.64] 10/25/2016  . Cocaine use disorder, mild, abuse ([place]) [F14.10] 10/25/2016  . Cannabis use disorder, severe, dependence ([place]) [F12.20] 10/25/2016  . Insomnia [G47.00]   . Anxiety state [F41.1]   . Cocaine abuse ([place]) [F14.10] 07/04/2015  . Cannabis abuse, continuous [F12.10] 07/02/2015  . Non compliance with medical treatment [Z91.19] 06/18/2015    Total Time spent with patient: 30 minutes  Subjective:   Amy [name] is a 22 y.o. female patient presented to [facility] stating that stating  "I just almost killed my family and myself." Then retracting her statement later stating she was going to kill herself because of her family.  Reviewing patients' chart she has on multiple occassions referred to her family in a negative aspect even calling them devils during one visit.  Patient has history of meth amphetamine abuse eliciting acute psychosis and it also appears patient goes from one hospital to the next not following up on outpatient psychiatric services and non-adherent to medication regimen.   Patient has had multiple psychiatric hospitalizations mainly in [place]: 3/13-07/24/17; 3/25-3/27/19;  4/8-4/11/19; 7/4-11/25/17; 9/5-9/11/19; and 4 ED visit.  Patient has also presented to [facility] with multiple ED visits 3/31, 6/26, 12/06/2017.  Patient discharged from [facility] yesterday 01/30/18.  Per record review of [facility] summary of hospital course/discharge summary; reviewed by this provider. Hospital Course:  This was a near identical presentation to her previous admissions this year she has 21 has schizoaffective disorder currently on long-acting injectable but again abusing meth amphetamines and cannabis leading to acute psychosis. Once here she organized very quickly and displayed no danger behaviors to the weekend achieved her baseline status by the date of the ninth. Out he has a long-acting injectable administered early in the month. Principal Problem (Resolved): Amphetamine abuse ([place]) Active Problems: * No active hospital problems. * Discharge Mental Status Evaluation: Alert oriented cooperative affect appropriate speech normal rate and tone mood stable and euthymic no thoughts of harming self or others no psychosis or seizure activity no involuntary movements    HPI:  Francisco Capuchin, 22 y.o., female patient seen via telepsych by this provider; chart reviewed and consulted with Dr. Dwyane Dee on 01/31/18.  On evaluation Amy [name] reports that she just got out of [facility] but don't feel they helped her.  States that she doesn't take her medications once discharged "because they are downers and they make me feel depressed.  I need an upper to make me feel better.  [facility] doesn't understand.  Patient asked about another outpatient provider and patient stated that she did not need to go to any of them because she has had bad stories about them from her friends "I just stick with [facility]."  Patient states that it has been 2 months since her  last Abilify Maintena and that she did not want to have one prior to discharge.  Patient also states that  she is not going to take any of her medication because there are downers.  Patient states that she lives with her mother and father and thinks she has an appointment with [facility] next week and that her father will be taking her there.  Patient states that she has done no other drugs other than marijuana since hospital discharge and that was done on her birthday; and doesn't feel that marijuana is bad for anyone.  At current time patient denies suicidal/self-harm/homicidal ideation, psychosis, and paranoia.  Patient stating that she wanted to go to another hospital for different medication.   During evaluation Amy [name] is alert/oriented x 4; calm/cooperative with pleasant affect.  She did not appear to be responding to internal/external stimuli or delusional thoughts; and at current time patient denied suicidal/self-harm/homicidal ideation, psychosis, and paranoia.  Patient answered question appropriately.  Patient continues to be non adherent to medication regimen and outpatient psychiatric services.  Patient encouraged to keep any scheduled appointment with [facility] or to schedule a follow up appointment for medication management.      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  Non-adherent to outpatient psychiatric services and medication regimen  Risk to Self: Suicidal Ideation: No-Not Currently/Within Last 6 Months Suicidal Intent: No Is patient at risk for suicide?: Yes Suicidal Plan?: No-Not Currently/Within Last 6 Months Access to Means: Yes Specify Access to Suicidal Means: take pills but denies SI currently What has been your use of drugs/alcohol within the last 12 months?: crystal meth and marijuana use How many times?: 0 Other Self Harm Risks:  none Triggers for Past Attempts: None known Intentional Self Injurious Behavior: None Risk to Others: Homicidal Ideation: No Thoughts of Harm to Others: No Current Homicidal Intent: No Current Homicidal Plan: No Access to Homicidal Means: No Identified Victim: none History of harm to others?: No Assessment of Violence: None Noted Violent Behavior Description: none Does patient have access to weapons?: No Criminal Charges Pending?: No Does patient have a court date: No Prior Inpatient Therapy: Prior Inpatient Therapy: Yes Prior Therapy Dates: 01/2018, 2018, 2017 Prior Therapy Facilty/Provider(s): [facility], [place] Reason for Treatment: psychosis Prior Outpatient Therapy: Prior Outpatient Therapy: Yes Prior Therapy Dates: current Prior Therapy Facilty/Provider(s): [facility] Reason for Treatment: psychosis Does patient have an ACCT team?: Unknown Does patient have Intensive In-House Services?  : No Does patient have [facility] services? : No Does patient have P4CC services?: No  Past Medical History:  Past Medical History:  Diagnosis Date  . Cannabis abuse   . Depression   . Methamphetamine abuse ([place])   . Schizo affective schizophrenia ([place])    History reviewed. No pertinent surgical history. Family History:  Family History  Problem Relation Age of Onset  . Mental illness Neg Hx    Family Psychiatric  History: Unaware Social History:  Social History   Substance and Sexual Activity  Alcohol Use No     Social History   Substance and Sexual Activity  Drug Use Yes  . Types: Marijuana   Comment: denies    Social History   Socioeconomic History  . Marital status: Single    Spouse name: Not on file  . Number of children: Not on file  . Years of education: Not on file  . Highest  education level: Not on file  Occupational History  . Not on file  Social Needs  . Financial resource strain: Not on file  . Food insecurity:    Worry: Not on file    Inability: Not on  file  . Transportation needs:    Medical: Not on file    Non-medical: Not on file  Tobacco Use  . Smoking status: Current Every Day Smoker    Types: Cigarettes  . Smokeless tobacco: Never Used  Substance and Sexual Activity  . Alcohol use: No  . Drug use: Yes    Types: Marijuana    Comment: denies  . Sexual activity: Not on file  Lifestyle  . Physical activity:    Days per week: Not on file    Minutes per session: Not on file  . Stress: Not on file  Relationships  . Social connections:    Talks on phone: Not on file    Gets together: Not on file    Attends religious service: Not on file    Active member of club or organization: Not on file    Attends meetings of clubs or organizations: Not on file    Relationship status: Not on file  Other Topics Concern  . Not on file  Social History Narrative  . Not on file   Additional Social History:    Allergies:   Allergies  Allergen Reactions  . Penicillins Rash    Has patient had a PCN reaction causing immediate rash, facial/tongue/throat swelling, SOB or lightheadedness with hypotension: Yes Has patient had a PCN reaction causing severe rash involving mucus membranes or skin necrosis: No Has patient had a PCN reaction that required hospitalization: No Has patient had a PCN reaction occurring within the last 10 years: Yes If all of the above answers are "NO", then may   . Cogentin [Benztropine] Itching  . Divalproex Sodium Other (See Comments)    Creates feelings of paranoia, some suicidal feelings, and makes her feel that "people are coming after" her Creates feelings of paranoia, some suicidal feelings, and makes her feel that "people are coming after" her  . Risperidone Other (See Comments)    "Makes me cough"  . Valproic Acid Other (See Comments)    Creates paranoia/Per [place] paranoia/Per [place]:  Results for orders placed or performed during the hospital encounter of 01/30/18  (from the past 48 hour(s))  Comprehensive metabolic panel     Status: None   Collection Time: 01/30/18  2:24 PM  Result Value Ref Range   Sodium 139 135 - 145 mmol/L   Potassium 4.2 3.5 - 5.1 mmol/L   Chloride 110 98 - 111 mmol/L   CO2 22 22 - 32 mmol/L   Glucose, Bld 90 70 - 99 mg/dL   BUN 6 6 - 20 mg/dL   Creatinine, Ser 0.75 0.44 - 1.00 mg/dL   Calcium 9.2 8.9 - 10.3 mg/dL   Total Protein 7.0 6.5 - 8.1 g/dL   Albumin 4.1 3.5 - 5.0 g/dL   AST 15 15 - 41 U/L   ALT 10 0 - 44 U/L   Alkaline Phosphatase 49 38 - 126 U/L   Total Bilirubin 0.5 0.3 - 1.2 mg/dL   GFR calc non Af Amer >60 >60 mL/min   GFR calc Af Amer >60 >60 mL/min    Comment: (NOTE) The eGFR has been calculated using the CKD EPI equation. This calculation has not been validated  in all clinical situations. eGFR's persistently <60 mL/min signify possible Chronic Kidney Disease.    Anion gap 7 5 - 15    Comment: Performed at [facility] [address] [place], [place] [address]  Ethanol     Status: None   Collection Time: 01/30/18  2:24 PM  Result Value Ref Range   Alcohol, Ethyl (B) <10 <10 mg/dL    Comment: (NOTE) Lowest detectable limit for serum alcohol is 10 mg/dL. For medical purposes only. Performed at [facility], [place] [address], [place], [place] [address]   Salicylate level     Status: None   Collection Time: 01/30/18  2:24 PM  Result Value Ref Range   Salicylate Lvl <0.8 2.8 - 30.0 mg/dL    Comment: Performed at [facility] [address] [place], [place] [address]  Acetaminophen level     Status: Abnormal   Collection Time: 01/30/18  2:24 PM  Result Value Ref Range   Acetaminophen (Tylenol), Serum <10 (L) 10 - 30 ug/mL    Comment: (NOTE) Therapeutic concentrations vary significantly. A range of 10-30 ug/mL  may be an effective concentration for many patients. However, some  are best treated at concentrations outside of this range. Acetaminophen concentrations >150 ug/mL at  4 hours after ingestion  and >50 ug/mL at 12 hours after ingestion are often associated with  toxic reactions. Performed at [facility], [place] [address] [place], [place] [address]   cbc     Status: Abnormal   Collection Time: 01/30/18  2:24 PM  Result Value Ref Range   WBC 7.3 4.0 - 10.5 K/uL   RBC 4.01 3.87 - 5.11 MIL/uL   Hemoglobin 13.3 12.0 - 15.0 g/dL   HCT 40.7 36.0 - 46.0 %   MCV 101.5 (H) 78.0 - 100.0 fL   MCH 33.2 26.0 - 34.0 pg   MCHC 32.7 30.0 - 36.0 g/dL   RDW 12.5 11.5 - 15.5 %   Platelets 320 150 - 400 K/uL    Comment: Performed at [facility] [address], [place], [place] [address]  I-Stat beta hCG blood, ED     Status: None   Collection Time: 01/30/18  2:51 PM  Result Value Ref Range   I-stat hCG, quantitative <5.0 <5 mIU/mL   Comment 3            Comment:   GEST. AGE      CONC.  (mIU/mL)   <=1 WEEK        5 - 50     2 WEEKS       50 - 500     3 WEEKS       100 - 10,000     4 WEEKS     1,000 - 30,000        FEMALE AND NON-PREGNANT FEMALE:     LESS THAN 5 mIU/mL   Rapid urine drug screen (hospital performed)     Status: Abnormal   Collection Time: 01/30/18  8:48 PM  Result Value Ref Range   Opiates NONE DETECTED NONE DETECTED   Cocaine NONE DETECTED NONE DETECTED   Benzodiazepines NONE DETECTED NONE DETECTED   Amphetamines NONE DETECTED NONE DETECTED   Tetrahydrocannabinol POSITIVE (A) NONE DETECTED   Barbiturates NONE DETECTED NONE DETECTED    Comment: (NOTE) DRUG SCREEN FOR MEDICAL PURPOSES ONLY.  IF CONFIRMATION IS NEEDED FOR ANY PURPOSE, NOTIFY LAB WITHIN 5 DAYS. LOWEST DETECTABLE LIMITS  FOR URINE DRUG SCREEN Drug Class                     Cutoff (ng/mL) Amphetamine and metabolites    1000 Barbiturate and metabolites    200 Benzodiazepine                 601 Tricyclics and metabolites     300 Opiates and metabolites        300 Cocaine and metabolites        300 THC                            50 Performed at [facility], [place] [address] [place], [place] [address]     Medications:  Current Facility-Administered Medications  Medication Dose Route Frequency Provider Last Rate Last Dose  . lithium carbonate (LITHOBID) CR tablet 300 mg  300 mg Oral Q12H Domenic Moras, PA-C   300 mg at 01/31/18 1039  . traZODone (DESYREL) tablet 100 mg  100 mg Oral QHS Domenic Moras, PA-C       Current Outpatient Medications  Medication Sig Dispense Refill  . ARIPiprazole ER 400 MG SRER Inject 400 mg into the muscle every 28 (twenty-eight) days. (Due 11-28-16): For mood control (Patient not taking: Reported on 10/27/2017) 1 each 0  . ibuprofen (ADVIL,MOTRIN) 800 MG tablet Take 1 tablet (800 mg total) by mouth 2 (two) times daily. With food (Patient not taking: Reported on 11/14/2017) 12 tablet 0  . lithium carbonate (LITHOBID) 300 MG CR tablet Take 1 tablet (300 mg total) by mouth every 12 (twelve) hours. (Patient not taking: Reported on 11/02/2017) 28 tablet 0  . traZODone (DESYREL) 100 MG tablet Take 1 tablet (100 mg total) by mouth at bedtime. (Patient not taking: Reported on 11/02/2017) 14 tablet 0    Musculoskeletal: Strength & Muscle Tone: within normal limits Gait & Station: normal Patient leans: [name]  Psychiatric Specialty Exam: Physical Exam  Nursing note and vitals reviewed.   ROS  Blood pressure 110/61, pulse (!) 48, temperature 98.2 F (36.8 C), temperature source Oral, resp. rate 16, SpO2 97 %.There is no height or weight on file to calculate BMI.  General Appearance: Casual  Eye Contact:  Good  Speech:  Clear and Coherent and Normal Rate  Volume:  Normal  Mood:  Euthymic  Affect:  Congruent  Thought Process:  Coherent and Descriptions of Associations: Intact  Orientation:  Full (Time, Place, and Person)  Thought Content:  WDL and Logical  Suicidal Thoughts:  No  Homicidal Thoughts:  No  Memory:  Immediate;   Good Recent;   Good Remote;   Good  Judgement:  Fair  Insight:  Fair  Psychomotor  Activity:  Normal  Concentration:  Concentration: Good and Attention Span: Good  Recall:  Good  Fund of Knowledge:  Good  Language:  Good  Akathisia:  No  Handed:  Right  AIMS (if indicated):     Assets:  Leisure Time Physical Health Social Support  ADL's:  Intact  Cognition:  WNL  Sleep:        Treatment Plan Summary: Plan Psychiatrically clear; follow up with current psychiatric provider  Disposition:  Patient psychiatrically cleared.  No evidence of imminent risk to self or others at present.   Patient does not meet criteria for psychiatric inpatient admission. Supportive therapy provided about ongoing stressors. Refer to IOP. Discussed crisis plan, support from social  network, calling 911, coming to the Emergency Department, and calling Suicide Hotline.  Spoke with Marjorie Smolder, NP; informed of above recommendation and disposition  This service was provided via telemedicine using a 2-way, interactive audio and video technology.  Names of all persons participating in this telemedicine service and their role in this encounter. Name: Earleen Newport, Rn Role: Tele psych Assessment  Name: Dr. Dwyane Dee Role: Psychiatrist  Name: [place] C. Smethers Role:   Name:  Role:     Earleen Newport, NP 01/31/2018 3:28 PM

## 2018-01-31 NOTE — ED Notes (Signed)
Pt. At desk using the phone.

## 2018-02-06 ENCOUNTER — Encounter (HOSPITAL_BASED_OUTPATIENT_CLINIC_OR_DEPARTMENT_OTHER): Payer: Self-pay

## 2018-02-06 ENCOUNTER — Emergency Department (HOSPITAL_BASED_OUTPATIENT_CLINIC_OR_DEPARTMENT_OTHER)
Admission: EM | Admit: 2018-02-06 | Discharge: 2018-02-07 | Disposition: A | Discharge status: Home / Self Care | Payer: Self-pay | Attending: Emergency Medicine | Admitting: Emergency Medicine

## 2018-02-06 DIAGNOSIS — F17210 Nicotine dependence, cigarettes, uncomplicated: Secondary | ICD-10-CM | POA: Insufficient documentation

## 2018-02-06 DIAGNOSIS — F251 Schizoaffective disorder, depressive type: Secondary | ICD-10-CM | POA: Insufficient documentation

## 2018-02-06 DIAGNOSIS — F250 Schizoaffective disorder, bipolar type: Secondary | ICD-10-CM | POA: Insufficient documentation

## 2018-02-06 DIAGNOSIS — R45851 Suicidal ideations: Secondary | ICD-10-CM | POA: Insufficient documentation

## 2018-02-06 LAB — COMPREHENSIVE METABOLIC PANEL
ALBUMIN: 4.4 g/dL (ref 3.5–5.0)
ALT: 12 U/L (ref 0–44)
AST: 35 U/L (ref 15–41)
Alkaline Phosphatase: 48 U/L (ref 38–126)
Anion gap: 14 (ref 5–15)
BILIRUBIN TOTAL: 0.5 mg/dL (ref 0.3–1.2)
BUN: 7 mg/dL (ref 6–20)
CO2: 20 mmol/L — ABNORMAL LOW (ref 22–32)
Calcium: 9.1 mg/dL (ref 8.9–10.3)
Chloride: 106 mmol/L (ref 98–111)
Creatinine, Ser: 0.88 mg/dL (ref 0.44–1.00)
GFR calc Af Amer: >60 mL/min (ref >60)
GFR calc non Af Amer: >60 mL/min (ref >60)
GLUCOSE: 101 mg/dL — AB (ref 70–99)
Potassium: 3.5 mmol/L (ref 3.5–5.1)
SODIUM: 140 mmol/L (ref 135–145)
TOTAL PROTEIN: 7.4 g/dL (ref 6.5–8.1)

## 2018-02-06 LAB — RAPID URINE DRUG SCREEN, HOSP PERFORMED
AMPHETAMINES: NOT DETECTED
BENZODIAZEPINES: NOT DETECTED
Barbiturates: NOT DETECTED
Cocaine: NOT DETECTED
Opiates: NOT DETECTED
TETRAHYDROCANNABINOL: POSITIVE — AB

## 2018-02-06 LAB — CBC
HCT: 36.8 % (ref 36.0–46.0)
Hemoglobin: 12.7 g/dL (ref 12.0–15.0)
MCH: 33.2 pg (ref 26.0–34.0)
MCHC: 34.5 g/dL (ref 30.0–36.0)
MCV: 96.3 fL (ref 78.0–100.0)
Platelets: 268 10*3/uL (ref 150–400)
RBC: 3.82 MIL/uL — ABNORMAL LOW (ref 3.87–5.11)
RDW: 12.3 % (ref 11.5–15.5)
WBC: 9.4 10*3/uL (ref 4.0–10.5)

## 2018-02-06 LAB — SALICYLATE LEVEL: Salicylate Lvl: <7 mg/dL (ref 2.8–30.0)

## 2018-02-06 LAB — PREGNANCY, URINE: Preg Test, Ur: NEGATIVE

## 2018-02-06 LAB — ACETAMINOPHEN LEVEL: Acetaminophen (Tylenol), Serum: <10 ug/mL — ABNORMAL LOW (ref 10–30)

## 2018-02-06 LAB — ETHANOL: Alcohol, Ethyl (B): <10 mg/dL (ref <10)

## 2018-02-06 MED ORDER — PROMETHAZINE HCL 25 MG/ML IJ SOLN
25.00 | INTRAMUSCULAR | Status: DC
Start: ? — End: 2018-02-06

## 2018-02-06 MED ORDER — HYDROXYZINE HCL 25 MG PO TABS
50.00 | ORAL_TABLET | ORAL | Status: DC
Start: ? — End: 2018-02-06

## 2018-02-06 MED ORDER — NICOTINE POLACRILEX 2 MG MT GUM
2.00 | CHEWING_GUM | OROMUCOSAL | Status: DC
Start: ? — End: 2018-02-06

## 2018-02-06 MED ORDER — LORAZEPAM 2 MG/ML IJ SOLN
1.0000 mg | Freq: Once | INTRAMUSCULAR | Status: AC
  Administered 2018-02-06: 1 mg via INTRAVENOUS
  Filled 2018-02-06: qty 1

## 2018-02-06 MED ORDER — ACETAMINOPHEN 325 MG PO TABS
650.00 | ORAL_TABLET | ORAL | Status: DC
Start: ? — End: 2018-02-06

## 2018-02-06 MED ORDER — GENERIC EXTERNAL MEDICATION
5.00 | Status: DC
Start: ? — End: 2018-02-06

## 2018-02-06 MED ORDER — PROMETHAZINE HCL 25 MG PO TABS
25.00 | ORAL_TABLET | ORAL | Status: DC
Start: ? — End: 2018-02-06

## 2018-02-06 MED ORDER — CLONIDINE HCL 0.1 MG PO TABS
0.10 | ORAL_TABLET | ORAL | Status: DC
Start: ? — End: 2018-02-06

## 2018-02-06 MED ORDER — HALOPERIDOL LACTATE 5 MG/ML IJ SOLN
5.0000 mg | Freq: Once | INTRAMUSCULAR | Status: AC
  Administered 2018-02-06: 5 mg via INTRAVENOUS
  Filled 2018-02-06: qty 1

## 2018-02-06 MED ORDER — HALOPERIDOL 5 MG PO TABS
5.00 | ORAL_TABLET | ORAL | Status: DC
Start: ? — End: 2018-02-06

## 2018-02-06 MED ORDER — DIPHENHYDRAMINE HCL 50 MG/ML IJ SOLN
25.0000 mg | Freq: Once | INTRAMUSCULAR | Status: AC
  Administered 2018-02-06: 25 mg via INTRAVENOUS
  Filled 2018-02-06: qty 1

## 2018-02-06 MED ORDER — ZIPRASIDONE MESYLATE 20 MG IM SOLR
20.00 | INTRAMUSCULAR | Status: DC
Start: ? — End: 2018-02-06

## 2018-02-06 MED ORDER — LORAZEPAM 1 MG PO TABS
1.0000 mg | ORAL_TABLET | Freq: Four times a day (QID) | ORAL | Status: DC | PRN
  Administered 2018-02-06: 1 mg via ORAL

## 2018-02-06 MED ORDER — METHOCARBAMOL 750 MG PO TABS
1500.00 | ORAL_TABLET | ORAL | Status: DC
Start: ? — End: 2018-02-06

## 2018-02-06 MED ORDER — LORAZEPAM 1 MG PO TABS
ORAL_TABLET | ORAL | Status: AC
  Filled 2018-02-06: qty 1

## 2018-02-06 MED ORDER — GENERIC EXTERNAL MEDICATION
150.00 | Status: DC
Start: 2018-02-05 — End: 2018-02-06

## 2018-02-06 MED ORDER — NICOTINE 14 MG/24HR TD PT24
1.00 | MEDICATED_PATCH | TRANSDERMAL | Status: DC
Start: 2018-02-05 — End: 2018-02-06

## 2018-02-06 MED ORDER — PERPHENAZINE 2 MG PO TABS
4.00 | ORAL_TABLET | ORAL | Status: DC
Start: 2018-02-05 — End: 2018-02-06

## 2018-02-06 NOTE — ED Triage Notes (Signed)
Brought in my [place] [employer]. Speech rapid, states," [facility] am suicidal [facility]"

## 2018-02-06 NOTE — ED Notes (Signed)
Pt awake and eating, denies any needs at this time

## 2018-02-06 NOTE — Progress Notes (Signed)
Pt. meets criteria for inpatient treatment per Greer EeArchna Kumar, MD.  Referred out to the following hospitals: [facility]  [facility]  [facility]  [facility]  [facility]  [facility]  [facility]  [facility]  [facility]  [facility]  [facility]  [facility]    [facility]  Disposition CSW will continue to follow for placement.

## 2018-02-06 NOTE — ED Notes (Signed)
Staffing called for sitter, unavailable at this time

## 2018-02-06 NOTE — ED Notes (Signed)
Pt sitting up in bed eating

## 2018-02-06 NOTE — ED Notes (Signed)
Pt still resting with eyes closed, sitter at bedside

## 2018-02-06 NOTE — ED Notes (Signed)
Given frozen meal

## 2018-02-06 NOTE — ED Notes (Signed)
Screaming out loudly, incoherent. ED MD aware. Reassurance given

## 2018-02-06 NOTE — ED Notes (Signed)
Urinated in bed, linens changed as, well as paper scrubs

## 2018-02-06 NOTE — Progress Notes (Signed)
[facility] [employer] and assessed that the provider had not reviewed pt yet. CSW spoke with admissions staff Mary at 9pm, who stated that pt had been declined due to acuity.   Wells GuilesSarah Shikara Mcauliffe, LCSW, LCAS Disposition CSW [facility] [phone]

## 2018-02-06 NOTE — ED Notes (Signed)
Given crackers and kind bar.

## 2018-02-06 NOTE — ED Provider Notes (Signed)
[facility] EMERGENCY DEPARTMENT Provider Note   CSN: [id number] Arrival date & time: 02/06/18  0827     History   Chief Complaint Chief Complaint  Patient presents with  . Suicidal    HPI Amy [name] is a 22 y.o. female.  22 yo F with a chief complaint of suicidal ideation.  The patient states that she wants to be put on the right medicines.  She was just admitted and discharged from [facility] and she said the medicines were not working for her so she is here today.  She is unable to maintain a thought long enough to give me a plan or a reason for her suicidal ideation.  She is having multiple repetitive statements almost like tics.  Level 5 caveat mental illness.  The history is provided by the patient.  Illness  This is a new problem. The current episode started more than 1 week ago. The problem occurs constantly. The problem has not changed since onset.Pertinent negatives include no chest pain, no headaches and no shortness of breath. Nothing aggravates the symptoms. Nothing relieves the symptoms. She has tried nothing for the symptoms.    Past Medical History:  Diagnosis Date  . Cannabis abuse   . Depression   . 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 Schizo affective schizophrenia [facility])     Patient Active Problem List   Diagnosis Date Noted  . 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 Substance induced mood disorder (HCC) 11/14/2017  . 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 Substance abuse (HCC)   . Cocaine abuse with cocaine-induced mood disorder (HCC) 12/16/2016  . Bipolar disorder, curr episode mixed, severe, with psychotic features (HCC) 10/25/2016  . Cocaine use disorder, mild, abuse (HCC) 10/25/2016  . Cannabis use disorder, severe, dependence (HCC) 10/25/2016  . Insomnia   . Anxiety state   . Cocaine abuse (HCC) 07/04/2015  . Cannabis abuse, continuous 07/02/2015  . Non compliance with medical treatment  06/18/2015    History reviewed. No pertinent surgical history.   OB History   None      Home Medications    Prior to Admission medications   Medication Sig Start Date End Date Taking? Authorizing Provider  ARIPiprazole ER 400 MG SRER Inject 400 mg into the muscle every 28 (twenty-eight) days. (Due 11-28-16): For mood control Patient not taking: Reported on 10/27/2017 11/28/16   Armandina Stammer I, NP  ibuprofen (ADVIL,MOTRIN) 800 MG tablet Take 1 tablet (800 mg total) by mouth 2 (two) times daily. With food Patient not taking: Reported on 11/14/2017 11/02/17   Arthor Captain, PA-C  lithium carbonate (LITHOBID) 300 MG CR tablet Take 1 tablet (300 mg total) by mouth every 12 (twelve) hours. Patient not taking: Reported on 11/02/2017 10/28/17   Laveda Abbe, NP  traZODone (DESYREL) 100 MG tablet Take 1 tablet (100 mg total) by mouth at bedtime. Patient not taking: Reported on 11/02/2017 10/28/17   Laveda Abbe, NP    Family History Family History  Problem Relation Age of Onset  . Mental illness Neg Hx     Social History Social History   Tobacco Use  . Smoking status: Current Every Day Smoker    Types: Cigarettes  . Smokeless tobacco: Never Used  Substance Use Topics  . Alcohol use: No  . Drug use: Yes    Frequency: 7.0 times per week    Types: Marijuana    Comment: +THC  Allergies   Penicillins; Cogentin [benztropine]; Divalproex sodium; Risperidone; and Valproic acid   Review of Systems Review of Systems  Constitutional: Negative for chills and fever.  HENT: Negative for congestion and rhinorrhea.   Eyes: Negative for redness and visual disturbance.  Respiratory: Negative for shortness of breath and wheezing.   Cardiovascular: Negative for chest pain and palpitations.  Gastrointestinal: Negative for nausea and vomiting.  Genitourinary: Negative for dysuria and urgency.  Musculoskeletal: Negative for arthralgias and myalgias.  Skin: Negative for pallor  and wound.  Neurological: Negative for dizziness and headaches.  Psychiatric/Behavioral: Positive for agitation and suicidal ideas.     Physical Exam Updated Vital Signs BP 121/70 (BP Location: Left Arm)   Pulse 90   Temp 99.1 F (37.3 C) (Oral)   Resp (!) 24   SpO2 97%   Physical Exam  Constitutional: She is oriented to person, place, and time. She appears well-developed and well-nourished. No distress.  HENT:  Head: Normocephalic and atraumatic.  Eyes: Pupils are equal, round, and reactive to light. EOM are normal.  Neck: Normal range of motion. Neck supple.  Cardiovascular: Normal rate and regular rhythm. Exam reveals no gallop and no friction rub.  No murmur heard. Pulmonary/Chest: Effort normal. She has no wheezes. She has no rales.  Abdominal: Soft. She exhibits no distension and no mass. There is no tenderness. There is no guarding.  Musculoskeletal: She exhibits no edema or tenderness.  Neurological: She is alert and oriented to person, place, and time.  Skin: Skin is warm and dry. She is not diaphoretic.  Psychiatric: Her behavior is normal. Her affect is blunt. Her speech is rapid and/or pressured and tangential. She expresses impulsivity. She expresses suicidal ideation. She expresses no homicidal ideation. She expresses no suicidal plans and no homicidal plans.  Nursing note and vitals reviewed.    ED Treatments / Results  Labs (all labs ordered are listed, but only abnormal results are displayed) Labs Reviewed  COMPREHENSIVE METABOLIC PANEL - Abnormal; Notable for the following components:      Result Value   CO2 20 (*)    Glucose, Bld 101 (*)    All other components within normal limits  ACETAMINOPHEN LEVEL - Abnormal; Notable for the following components:   Acetaminophen (Tylenol), Serum <10 (*)    All other components within normal limits  CBC - Abnormal; Notable for the following components:   RBC 3.82 (*)    All other components within normal limits    RAPID URINE DRUG SCREEN, HOSP PERFORMED - Abnormal; Notable for the following components:   Tetrahydrocannabinol POSITIVE (*)    All other components within normal limits  ETHANOL  SALICYLATE LEVEL  PREGNANCY, URINE    EKG None  Radiology No results found.  Procedures Procedures (including critical care time)  Medications Ordered in ED Medications  haloperidol lactate (HALDOL) injection 5 mg (5 mg Intravenous Given 02/06/18 0939)  LORazepam (ATIVAN) injection 1 mg (1 mg Intravenous Given 02/06/18 0938)  diphenhydrAMINE (BENADRYL) injection 25 mg (25 mg Intravenous Given 02/06/18 0938)     Initial Impression / Assessment and Plan / ED Course  I have reviewed the triage vital signs and the nursing notes.  Pertinent labs & imaging results that were available during my care of the patient were reviewed by me and considered in my medical decision making (see chart for details).     22 yo F here for suicidal ideation.  The patient has a history of schizoaffective disorder.  She was  recently in the hospital.  She has not been taking her medicines except while she was in the hospital.  She wants to be started on medication to help her.  The patient was spontaneously screaming and running onto the hallway and yelling at different people.  She was given Haldol and Ativan and Benadryl with some improvement.  Will have TTS evaluate.  Labs with trace acidosis, otherwise unremarkable.  Feel she is medically clear, TTS recommends inpatient.  Waiting for placement.   The patients results and plan were reviewed and discussed.   Any x-rays performed were independently reviewed by myself.   Differential diagnosis were considered with the presenting HPI.  Medications  haloperidol lactate (HALDOL) injection 5 mg (5 mg Intravenous Given 02/06/18 0939)  LORazepam (ATIVAN) injection 1 mg (1 mg Intravenous Given 02/06/18 0938)  diphenhydrAMINE (BENADRYL) injection 25 mg (25 mg Intravenous Given  02/06/18 0938)    Vitals:   02/06/18 0901  BP: 121/70  Pulse: 90  Resp: (!) 24  Temp: 99.1 F (37.3 C)  TempSrc: Oral  SpO2: 97%    Final diagnoses:  Schizoaffective disorder, bipolar type [facility](HCC)      Final Clinical Impressions(s) / ED Diagnoses   Final diagnoses:  Schizoaffective disorder, bipolar type [facility](HCC)    ED Discharge Orders    None       Melene PlanFloyd, Ariele Vidrio, DO 02/06/18 1133

## 2018-02-06 NOTE — BH Assessment (Signed)
Tele Assessment Note   Patient Name: Amy [name] MRN: [id number] Referring Physician: Edward Jolly Location of Patient: [facility] Location of Provider: Behavioral Health TTS Department  Amy [name] is a 22 y.o. female who presented to [facility] via police with complaint of suicidal ideation and with apparent psychosis.  Pt was last assessed by TTS on 01/30/18.  At that time, she presented to [facility] with complaint of suicidal ideation and psychosis.  Pt was admitted to [facility], and she was just discharged on 02/05/18.  Pt is single, unemployed, and she lives with her parents in [place].   Pt was a poor historian as she was very tangential in her speech and was difficult to redirect.  Pt reported that she felt suicidal today.  At this Pt showed her wrist which had apparent scarring.  Pt was labile and tangential.  Her content of speech ranged from discussion of aliens to how she loves a Environmental consultant to issues with God.  Pt repeated statements many times (echolalia).  When asked about hallucination, Pt talked about ''telecommunication'' and ''mind implants.''  When asked what assistance she would like, Pt stated that she wanted to make sure she was clean.  Pt also spoke about alien visitation.  Pt's UDS was positive for THC, and Pt stated that she smokes marijuana four times a day.  In previous assessment, Pt reported Pt stated that she receives outpatient services through [facility], but that ''they don't help me.''  Pt endorsed daily use of marijuana -- four times a day.  Per previous history, Pt used crystal meth on or about 01/23/18.  She denied current use.  Pt was released from [facility] on 01/28/18.  Pt has several upcoming court dates -- 02/07/18 for felony possession of a stolen motor vehicle; 02/15/18 for second degree trespass; and 03/14/18 for injury to real property.  Pt also reported that she does not sleep or eat well.  Per previous assessment, pt chews food and then spits it  out.  During assessment, Pt presented as alert, oriented, and appropriately groomed.  She had good eye contact.  Pt endorsed suicidal ideation (without plan or intent), and she appeared to be experiencing a psychotic episode.  Pt's speech was tangential.  Pt's thought processes suggested flight of ideas, and thought content suggested delusion and hallucination.  Pt's memory and concentration were poor.  Insight, judgment, and impulse control were poor.  Consulted with ALucianne Muss, MD, who determined that Pt meets inpatient criteria.  Diagnosis: F25.1 Schizoaffective Disorder, Depressive Type; Cannabis Use Disorder  Past Medical History:  Past Medical History:  Diagnosis Date  . Cannabis abuse   . Depression   . 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 Schizo affective schizophrenia (HCC)     History reviewed. No pertinent surgical history.  Family History:  Family History  Problem Relation Age of Onset  . Mental illness Neg Hx     Social History:  reports that she has been smoking cigarettes. She has never used smokeless tobacco. She reports that she has current or past drug history. Drug: Marijuana. Frequency: 7.00 times per week. She reports that she does not drink alcohol.  Additional Social History:  Alcohol / Drug Use Pain Medications: See MAR Prescriptions: See MAR Over the Counter: See MAR History of alcohol / drug use?: Yes Substance #1 Name of Substance 1: Marijuana 1 - Amount (size/oz): Varied 1 - Frequency: Daily 1 - Duration: Ongoing 1 - Last Use / Amount: 02/05/18  Substance #2 Name of Substance 2: Crystal meth 2 - Last Use / Amount: 01/23/18  CIWA: CIWA-Ar BP: 121/70 Pulse Rate: 90 COWS:    Allergies:  Allergies  Allergen Reactions  . Penicillins Rash    Has patient had a PCN reaction causing immediate rash, facial/tongue/throat swelling, SOB or lightheadedness with hypotension: Yes Has patient had a PCN reaction causing severe rash involving mucus membranes or skin  necrosis: No Has patient had a PCN reaction that required hospitalization: No Has patient had a PCN reaction occurring within the last 10 years: Yes If all of the above answers are "NO", then may   . Cogentin [Benztropine] Itching  . Divalproex Sodium Other (See Comments)    Creates feelings of paranoia, some suicidal feelings, and makes her feel that "people are coming after" her Creates feelings of paranoia, some suicidal feelings, and makes her feel that "people are coming after" her  . Risperidone Other (See Comments)    "Makes me cough"  . Valproic Acid Other (See Comments)    [facility]ia/[facility] Creates paranoia/Per [facility]    Home Medications:  (Not in a hospital admission)  OB/GYN Status:  No LMP recorded.  General Assessment Data Location of Assessment: [facility] TTS Assessment: In system Is this a Tele or Face-to-Face Assessment?: Tele Assessment Is this an Initial Assessment or a Re-assessment for this encounter?: Initial Assessment Patient Accompanied by:: [name] Language Other than English: No Living Arrangements: Other (Comment)(Per history, lives with parents) What gender do you identify as?: Female Marital status: Single Maiden name: Bowsher Pregnancy Status: No Living Arrangements: Parent Can pt return to current living arrangement?: No Admission Status: Voluntary Is patient capable of signing voluntary admission?: Yes Referral Source: Other(Police) Insurance type: Self pay     Crisis Care Plan Living Arrangements: Parent Legal Guardian: Other:(Self) Name of Psychiatrist: [facility] Name of Therapist: [facility]  Education Status Is patient currently in school?: No Is the patient employed, unemployed or receiving disability?: Unemployed  Risk to self with the past 6 months Suicidal Ideation: Yes-Currently Present Has patient been a risk to self within the past 6 months prior to admission? :  Yes Suicidal Intent: No Has patient had any suicidal intent within the past 6 months prior to admission? : No Is patient at risk for suicide?: Yes Suicidal Plan?: No-Not Currently/Within Last 6 Months Has patient had any suicidal plan within the past 6 months prior to admission? : Yes Access to Means: Yes Specify Access to Suicidal Means: Pills What has been your use of drugs/alcohol within the last 12 months?: Marijuana, Crystal Meth Previous Attempts/Gestures: Yes How many times?: (Unknown -- Pt said yes, but did not say how often) Triggers for Past Attempts: None known Intentional Self Injurious Behavior: Cutting Comment - Self Injurious Behavior: Evident scasrs on right wrist Family Suicide History: No Recent stressful life event(s): Other (Comment)(Unknown; [facility] Persecutory voices/beliefs?: Yes Depression: No Depression Symptoms: Feeling angry/irritable Substance abuse history and/or treatment for substance abuse?: Yes Suicide prevention information given to non-admitted patients: Not applicable  Risk to Others within the past 6 months Homicidal Ideation: No Does patient have any lifetime risk of violence toward others beyond the six months prior to admission? : No Thoughts of Harm to Others: No Current Homicidal Intent: No Current Homicidal Plan: No Access to Homicidal Means: No Identified Victim: None History of harm to others?: No Assessment of Violence: None Noted Does  patient have access to weapons?: No Criminal Charges Pending?: No Does patient have a court date: No Is patient on probation?: No  Psychosis Hallucinations: Auditory, Visual Delusions: Grandiose(Religious, talk of aliens)  Mental Status Report Appearance/Hygiene: Unremarkable, In scrubs Eye Contact: Good Motor Activity: Freedom of movement, Unremarkable Speech: Rapid, Tangential, Echolalia, Incoherent Level of Consciousness: Alert Mood: Labile Affect: Labile Anxiety  Level: None Thought Processes: Flight of Ideas Judgement: Impaired Orientation: Person, Place, Time, Situation Obsessive Compulsive Thoughts/Behaviors: None  Cognitive Functioning Concentration: Decreased Memory: Unable to Assess Is patient IDD: No Insight: Poor Impulse Control: Poor Appetite: Fair Have you had any weight changes? : No Change Sleep: Decreased Total Hours of Sleep: 4(Per previous history) Vegetative Symptoms: None  ADLScreening [facility] Assessment Services) Patient's cognitive ability adequate to safely complete daily activities?: Yes Patient able to express need for assistance with ADLs?: Yes Independently performs ADLs?: Yes (appropriate for developmental age)  Prior Inpatient Therapy Prior Inpatient Therapy: Yes Prior Therapy Dates: 01/2018 x2, 2018, 2017 Prior Therapy Facilty/Provider(s): [facility], [facility] Reason for Treatment: psychosis  Prior Outpatient Therapy Prior Outpatient Therapy: Yes Prior Therapy Dates: Ongoing Prior Therapy Facilty/Provider(s): [facility] Reason for Treatment: Schizoaffective Disorder Does patient have an ACCT team?: No Does patient have Intensive In-House Services?  : No Does patient have [facility] services? : No Does patient have P4CC services?: No  ADL Screening (condition at time of admission) Patient's cognitive ability adequate to safely complete daily activities?: Yes Is the patient deaf or have difficulty hearing?: No Does the patient have difficulty seeing, even when wearing glasses/contacts?: No Does the patient have difficulty concentrating, remembering, or making decisions?: No Patient able to express need for assistance with ADLs?: Yes Does the patient have difficulty dressing or bathing?: No Independently performs ADLs?: Yes (appropriate for developmental age) Does the patient have difficulty walking or climbing stairs?: No Weakness of Legs: None Weakness of Arms/Hands: None  Home Assistive  Devices/Equipment Home Assistive Devices/Equipment: None  Therapy Consults (therapy consults require a physician order) PT Evaluation Needed: No OT Evalulation Needed: No SLP Evaluation Needed: No Abuse/Neglect Assessment (Assessment to be complete while patient is alone) Abuse/Neglect Assessment Can Be Completed: Unable to assess, patient is non-responsive or altered mental status(Pt has denied in the past) Self-Neglect: Denies Values / Beliefs Cultural Requests During Hospitalization: None Spiritual Requests During Hospitalization: None Consults Spiritual Care Consult Needed: No Social Work Consult Needed: No Merchant navy officer (For Healthcare) Does Patient Have a Medical Advance Directive?: No Would patient like information on creating a medical advance directive?: No - Patient declined          Disposition:  Disposition Initial Assessment Completed for this Encounter: Yes Disposition of Patient: Admit Type of inpatient treatment program: (Per Alyse Low, MD, Pt meets inpt criteira)  This service was provided via telemedicine using a 2-way, interactive audio and video technology.  Names of all persons participating in this telemedicine service and their role in this encounter. Name: Darleene, [place] Role: Patient             Earline Mayotte 02/06/2018 10:33 AM

## 2018-02-06 NOTE — ED Notes (Signed)
Asking for something to eat. Will heat another frozen dinner .

## 2018-02-06 NOTE — ED Triage Notes (Signed)
Pt speaking rapid, religious and repetitive. States she is suicidal with a plan to cut her wrist as she showed me scars from the past

## 2018-02-07 ENCOUNTER — Encounter (HOSPITAL_BASED_OUTPATIENT_CLINIC_OR_DEPARTMENT_OTHER): Payer: Self-pay | Admitting: Registered Nurse

## 2018-02-07 NOTE — ED Provider Notes (Signed)
Patient cleared by psychiatry.  Okay for discharge to home.  Remained hemodynamically stable throughout my care.   Virgina NorfolkCuratolo, Naylani Bradner, DO 02/07/18 1030

## 2018-02-07 NOTE — ED Notes (Signed)
Pt sleeping, respirations are even and unlabored, sitter at bedside

## 2018-02-07 NOTE — ED Notes (Signed)
Pt was cleared from TTS, denies SI at this time. Pt discharged home. I spoke with pts dad, he states they had her felony charge for a stolen vehicle dismissed, states she can't come home it will be trespassing. Pt notified and states will call her friend to come get her. Pt a/o x4, speaking in normal conversation and understanding.

## 2018-02-07 NOTE — ED Notes (Signed)
History and current documentation reviewed. Pt appears to be resting quietly at this time with eyes closed, door to room open for constant observation

## 2018-02-07 NOTE — ED Notes (Signed)
Pt awake, pt calm and talkative to nursing staff. Alert and oriented, requesting breakfast. Pt states she feels much better, stated to this nurse she is worried about her being required to attend court today. Informed pt that the court would be made aware of her requiring medical treatment on the scheduled court date. Pt felt much better after speaking with this RN. Pt reassured, safety measures remain in place, sitter remains at bedside. Explained care currently being provided. Pt cooperative.

## 2018-02-07 NOTE — Consult Note (Signed)
  Tele Assessment   Amy [name], 22 y.o., female patient presented to [facility] ED with complaints of suicidal ideation; requesting to be put back on her medications.  Patient seen via telepsych by this provider; chart reviewed and consulted with Dr. Lucianne MussKumar on 02/07/18.    Recent inpatient psychiatric treatment at [facility] 9/13/ -02/05/18.  Per [facility] Admission/Discharge Summary; reviewed by this provider.  This was a latest of numerous admissions and encounters for [name]-she is well-known to the service she is 22 has a schizoaffective condition complicated by continued substance abuse this time it was simply cannabis dependence and noncompliance with her schizoaffective meds led to disorganized thought and behavior and petition for involuntary Hospital Course: Once here she still had some residual psychotic symptoms or period of time but routine meds were continued and she displayed no dangerous behaviors here she reported she had court tomorrow and wanted to leave on the 17th she was alert and oriented without acute psychosis the lessons are the same we keep telling her to avoid cannabis and methamphetamines and to be compliant with her meds she is on a long-acting injectable she has an act team following her Principal Problem (Resolved): Schizoaffective disorder (HCC) Active Problems: * No active hospital problems. *  Patient has had multiple psychiatric hospitalizations mainly [facility]: 3/13-07/24/17; 3/25-3/27/19; 4/8-4/11/19; 7/4-11/25/17; 9/5-9/11/19; 9/13-9/17/19, and 4 ED visit.  Patient has also presented to [facility] with multiple ED visits 3/31, 6/26, 7/18, 01/30/2018 and again today just after discharge [facility] on 02[facility]  On evaluation Amy [name] reports patient states that she has to go to court today.  Patient admits to being discharged [facility] 2 days ago because she thought she had to be in court but got  her court dates mixed up and has to be in court this morning.  Patient reports that she goes to [facility] for outpatient psychiatric services and that she will go today or tomorrow after her court date.  Patient denies suicidal/self-harm/homicidal ideation, psychosis, and paranoia.   During evaluation Amy [name] is alert/oriented x 4; calm/cooperative; and mood congruent with affect.  She does not appear to be responding to internal/external stimuli or delusional thoughts.  Patient denied suicidal/self-harm/homicidal ideation, psychosis, and paranoia.  Patient answered question appropriately.  Recommendations:  Follow up with [facility]; continue taking current medications  Disposition:  Patient psychiatrically cleared No evidence of imminent risk to self or others at present.   Patient does not meet criteria for psychiatric inpatient admission. Supportive therapy provided about ongoing stressors. Discussed crisis plan, support from social network, calling 911, coming to the Emergency Department, and calling Suicide Hotline.  Spoke with charge nurse Dianne, RN informed that Dr. Loa Socksuratala with a patient at this time.  Dianne, RN was informed of above recommendation and disposition and states that she will inform Dr. Loa Socksuratala.     Assunta FoundShuvon Dasani Thurlow, NP

## 2018-03-03 ENCOUNTER — Other Ambulatory Visit: Payer: Self-pay

## 2018-03-03 ENCOUNTER — Encounter (HOSPITAL_BASED_OUTPATIENT_CLINIC_OR_DEPARTMENT_OTHER): Payer: Self-pay

## 2018-03-03 ENCOUNTER — Emergency Department (HOSPITAL_BASED_OUTPATIENT_CLINIC_OR_DEPARTMENT_OTHER)
Admission: EM | Admit: 2018-03-03 | Discharge: 2018-03-03 | Disposition: A | Discharge status: Home / Self Care | Payer: Self-pay | Attending: Emergency Medicine | Admitting: Emergency Medicine

## 2018-03-03 DIAGNOSIS — F329 Major depressive disorder, single episode, unspecified: Secondary | ICD-10-CM | POA: Insufficient documentation

## 2018-03-03 DIAGNOSIS — R45851 Suicidal ideations: Secondary | ICD-10-CM | POA: Insufficient documentation

## 2018-03-03 DIAGNOSIS — Z9114 Patient's other noncompliance with medication regimen: Secondary | ICD-10-CM | POA: Insufficient documentation

## 2018-03-03 DIAGNOSIS — Z046 Encounter for general psychiatric examination, requested by authority: Secondary | ICD-10-CM | POA: Insufficient documentation

## 2018-03-03 DIAGNOSIS — F17210 Nicotine dependence, cigarettes, uncomplicated: Secondary | ICD-10-CM | POA: Insufficient documentation

## 2018-03-03 LAB — PREGNANCY, URINE: PREG TEST UR: NEGATIVE

## 2018-03-03 LAB — COMPREHENSIVE METABOLIC PANEL
ALBUMIN: 4.2 g/dL (ref 3.5–5.0)
ALT: 16 U/L (ref 0–44)
ANION GAP: 7 (ref 5–15)
AST: 15 U/L (ref 15–41)
Alkaline Phosphatase: 43 U/L (ref 38–126)
BUN: 10 mg/dL (ref 6–20)
CHLORIDE: 103 mmol/L (ref 98–111)
CO2: 26 mmol/L (ref 22–32)
Calcium: 9.1 mg/dL (ref 8.9–10.3)
Creatinine, Ser: 0.67 mg/dL (ref 0.44–1.00)
GFR calc Af Amer: >60 mL/min (ref >60)
GFR calc non Af Amer: >60 mL/min (ref >60)
GLUCOSE: 101 mg/dL — AB (ref 70–99)
POTASSIUM: 3.9 mmol/L (ref 3.5–5.1)
Sodium: 136 mmol/L (ref 135–145)
TOTAL PROTEIN: 7.2 g/dL (ref 6.5–8.1)
Total Bilirubin: 0.7 mg/dL (ref 0.3–1.2)

## 2018-03-03 LAB — RAPID URINE DRUG SCREEN, HOSP PERFORMED
AMPHETAMINES: NOT DETECTED
BARBITURATES: NOT DETECTED
BENZODIAZEPINES: NOT DETECTED
Cocaine: NOT DETECTED
Opiates: NOT DETECTED
TETRAHYDROCANNABINOL: POSITIVE — AB

## 2018-03-03 LAB — LITHIUM LEVEL: LITHIUM LVL: 0.12 mmol/L — AB (ref 0.60–1.20)

## 2018-03-03 LAB — CBC
HEMATOCRIT: 41.7 % (ref 36.0–46.0)
HEMOGLOBIN: 13.6 g/dL (ref 12.0–15.0)
MCH: 33.2 pg (ref 26.0–34.0)
MCHC: 32.6 g/dL (ref 30.0–36.0)
MCV: 101.7 fL — AB (ref 80.0–100.0)
NRBC: 0 % (ref 0.0–0.2)
Platelets: 261 10*3/uL (ref 150–400)
RBC: 4.1 MIL/uL (ref 3.87–5.11)
RDW: 13.3 % (ref 11.5–15.5)
WBC: 6.2 10*3/uL (ref 4.0–10.5)

## 2018-03-03 LAB — SALICYLATE LEVEL: Salicylate Lvl: <7 mg/dL (ref 2.8–30.0)

## 2018-03-03 LAB — ACETAMINOPHEN LEVEL

## 2018-03-03 LAB — ETHANOL

## 2018-03-03 MED ORDER — TRAZODONE HCL 50 MG PO TABS: 100.0000 mg | ORAL_TABLET | Freq: Every day | ORAL | Status: DC

## 2018-03-03 MED ORDER — LITHIUM CARBONATE ER 300 MG PO TBCR
300.0000 mg | EXTENDED_RELEASE_TABLET | Freq: Two times a day (BID) | ORAL | Status: DC
  Filled 2018-03-03: qty 1
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
## 2018-03-03 NOTE — Discharge Instructions (Addendum)
Make sure that you call your act team member tomorrow to arrange for outpatient counseling and to get your prescription medications refilled.  If you feel that you are unsafe or that you may be in danger of harming yourself call 911 immediately
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
## 2018-03-03 NOTE — ED Notes (Signed)
Pt eating a frozen a dinner, meatloaf & peanut butter crackers x2, x2 drinks of orange juice. No plastic knife with food. Plastic fork & spoon recovered after she was done with food.

## 2018-03-03 NOTE — ED Notes (Signed)
ED Provider at bedside. 
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
## 2018-03-03 NOTE — Progress Notes (Signed)
Patient is seen by me via tele-psych and I have consulted with Dr. Jola Babinski.  Patient states that she is suicidal only if she has to return back to her home with her mother and her father.  She reports that she if we find her another place to go she will not be suicidal. Patient has an ACT team, [facility], and they are contacted and have agreed to come and get the patient and help her find housing or a shelter. She denies nay homicidal ideations and hallucinations. She has frequent ED visits and is seeking housing. She continues to smoke marijuana regularly. At this time she does not meet inpatient criteria and is psychiatrically cleared. She has been arranged transportation with her ACT team, they will help her with housing and this has removed her from the situation that has made her feel suicidal. I have contacted Dr. Ethelda Chick and notified him of the recommendations.

## 2018-03-03 NOTE — ED Notes (Addendum)
Pt was eating a frozen dinner. Fork and trash went to the outside trash can and handing information to [employer]

## 2018-03-03 NOTE — ED Notes (Signed)
Pt was talking with TTS

## 2018-05-04 DIAGNOSIS — F1210 Cannabis abuse, uncomplicated: Secondary | ICD-10-CM | POA: Insufficient documentation

## 2018-05-16 ENCOUNTER — Emergency Department (HOSPITAL_BASED_OUTPATIENT_CLINIC_OR_DEPARTMENT_OTHER)
Admission: EM | Admit: 2018-05-16 | Discharge: 2018-05-17 | Disposition: A | Discharge status: Home / Self Care | Payer: Self-pay | Attending: Emergency Medicine | Admitting: Emergency Medicine

## 2018-05-16 ENCOUNTER — Encounter (HOSPITAL_BASED_OUTPATIENT_CLINIC_OR_DEPARTMENT_OTHER): Payer: Self-pay | Admitting: Emergency Medicine

## 2018-05-16 ENCOUNTER — Other Ambulatory Visit: Payer: Self-pay

## 2018-05-16 DIAGNOSIS — R443 Hallucinations, unspecified: Secondary | ICD-10-CM

## 2018-05-16 DIAGNOSIS — F259 Schizoaffective disorder, unspecified: Secondary | ICD-10-CM | POA: Insufficient documentation

## 2018-05-16 DIAGNOSIS — F309 Manic episode, unspecified: Secondary | ICD-10-CM

## 2018-05-16 DIAGNOSIS — F17210 Nicotine dependence, cigarettes, uncomplicated: Secondary | ICD-10-CM | POA: Insufficient documentation

## 2018-05-16 DIAGNOSIS — R45851 Suicidal ideations: Secondary | ICD-10-CM | POA: Insufficient documentation

## 2018-05-16 LAB — RAPID URINE DRUG SCREEN, HOSP PERFORMED
AMPHETAMINES: NOT DETECTED
Barbiturates: NOT DETECTED
Benzodiazepines: NOT DETECTED
Cocaine: NOT DETECTED
Opiates: NOT DETECTED
Tetrahydrocannabinol: POSITIVE — AB

## 2018-05-16 LAB — COMPREHENSIVE METABOLIC PANEL
ALT: 10 U/L (ref 0–44)
AST: 13 U/L — ABNORMAL LOW (ref 15–41)
Albumin: 3.9 g/dL (ref 3.5–5.0)
Alkaline Phosphatase: 38 U/L (ref 38–126)
Anion gap: 7 (ref 5–15)
BUN: 12 mg/dL (ref 6–20)
CHLORIDE: 108 mmol/L (ref 98–111)
CO2: 25 mmol/L (ref 22–32)
Calcium: 8.9 mg/dL (ref 8.9–10.3)
Creatinine, Ser: 0.85 mg/dL (ref 0.44–1.00)
GFR calc Af Amer: >60 mL/min (ref >60)
GFR calc non Af Amer: >60 mL/min (ref >60)
Glucose, Bld: 77 mg/dL (ref 70–99)
POTASSIUM: 3.7 mmol/L (ref 3.5–5.1)
Sodium: 140 mmol/L (ref 135–145)
Total Bilirubin: 0.5 mg/dL (ref 0.3–1.2)
Total Protein: 6.9 g/dL (ref 6.5–8.1)

## 2018-05-16 LAB — CBC WITH DIFFERENTIAL/PLATELET
Abs Immature Granulocytes: 0.03 10*3/uL (ref 0.00–0.07)
Basophils Absolute: 0 10*3/uL (ref 0.0–0.1)
Basophils Relative: 0 %
Eosinophils Absolute: 0.1 10*3/uL (ref 0.0–0.5)
Eosinophils Relative: 1 %
HCT: 37.8 % (ref 36.0–46.0)
HEMOGLOBIN: 12.4 g/dL (ref 12.0–15.0)
Immature Granulocytes: 0 %
LYMPHS PCT: 30 %
Lymphs Abs: 3.1 10*3/uL (ref 0.7–4.0)
MCH: 32.8 pg (ref 26.0–34.0)
MCHC: 32.8 g/dL (ref 30.0–36.0)
MCV: 100 fL (ref 80.0–100.0)
Monocytes Absolute: 0.9 10*3/uL (ref 0.1–1.0)
Monocytes Relative: 9 %
Neutro Abs: 5.9 10*3/uL (ref 1.7–7.7)
Neutrophils Relative %: 60 %
Platelets: 263 10*3/uL (ref 150–400)
RBC: 3.78 MIL/uL — AB (ref 3.87–5.11)
RDW: 12.6 % (ref 11.5–15.5)
WBC: 10.1 10*3/uL (ref 4.0–10.5)
nRBC: 0 % (ref 0.0–0.2)

## 2018-05-16 LAB — URINALYSIS, ROUTINE W REFLEX MICROSCOPIC
BILIRUBIN URINE: NEGATIVE
Glucose, UA: NEGATIVE mg/dL
Hgb urine dipstick: NEGATIVE
Ketones, ur: NEGATIVE mg/dL
Leukocytes, UA: NEGATIVE
Nitrite: NEGATIVE
Protein, ur: NEGATIVE mg/dL
Specific Gravity, Urine: 1.02 (ref 1.005–1.030)
pH: 5.5 (ref 5.0–8.0)

## 2018-05-16 LAB — ETHANOL

## 2018-05-16 LAB — ACETAMINOPHEN LEVEL: Acetaminophen (Tylenol), Serum: <10 ug/mL — ABNORMAL LOW (ref 10–30)

## 2018-05-16 LAB — SALICYLATE LEVEL

## 2018-05-16 LAB — LITHIUM LEVEL: Lithium Lvl: <0.06 mmol/L — ABNORMAL LOW (ref 0.60–1.20)

## 2018-05-16 LAB — PREGNANCY, URINE: PREG TEST UR: NEGATIVE
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
## 2018-05-16 NOTE — ED Notes (Signed)
TTS at the bedside. 

## 2018-05-16 NOTE — ED Notes (Signed)
TTS consult completed. Given snacks and soda. Calm and cooperative

## 2018-05-16 NOTE — ED Notes (Signed)
Remains calm and cooperative, asking for meal tray. Dinner heated and given
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
## 2018-05-16 NOTE — ED Notes (Signed)
[facility] patient meets inpatient criteria.

## 2018-05-16 NOTE — Care Management (Signed)
Writer referred patient to several inpatient psychiatric hospitals.  

## 2018-05-16 NOTE — ED Notes (Signed)
Faxed paperwork to behavioral health at [facility]

## 2018-05-16 NOTE — ED Triage Notes (Signed)
Reports she is paranoid.  Seen previously for same.  Has delusions of something growing inside her father.  States she got a "milk shot" yesterday.  States "people are picking with me".  Speaking in broken phrases.  Flight of ideas.  States fathers MRSA is talking to him.

## 2018-05-16 NOTE — ED Provider Notes (Signed)
[facility] EMERGENCY DEPARTMENT Provider Note   CSN: [id number] Arrival date & time: 05/16/18  [id number]     History   Chief Complaint Chief Complaint  Patient presents with  . Delusional    HPI Amy [name] is a 22 y.o. female.  The history is provided by the patient.  Mental Health Problem  Presenting symptoms: bizarre behavior, delusional, disorganized speech, disorganized thought process and paranoid behavior   Presenting symptoms: no suicidal thoughts and no suicidal threats   Patient accompanied by:  Law enforcement Degree of incapacity (severity):  Severe Onset quality:  Gradual Timing:  Constant Progression:  Unchanged Chronicity:  Recurrent Context: drug abuse (marijuana) and noncompliance   Treatment compliance:  Some of the time Relieved by:  Nothing Worsened by:  Drugs Associated symptoms: anxiety   Associated symptoms: no abdominal pain and no chest pain   Risk factors: hx of mental illness (schizo-affective disorder)     Past Medical History:  Diagnosis Date  . Cannabis abuse   . Depression   . 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 Schizo affective schizophrenia [facility](HCC)     Patient Active Problem List   Diagnosis Date Noted  . 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 Substance induced mood disorder (HCC) 11/14/2017  . 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 Substance abuse (HCC)   . Cocaine abuse with cocaine-induced mood disorder (HCC) 12/16/2016  . Bipolar disorder, curr episode mixed, severe, with psychotic features (HCC) 10/25/2016  . Cocaine use disorder, mild, abuse (HCC) 10/25/2016  . Cannabis use disorder, severe, dependence (HCC) 10/25/2016  . Insomnia   . Anxiety state   . Cocaine abuse (HCC) 07/04/2015  . Cannabis abuse, continuous 07/02/2015  . Non compliance with medical treatment 06/18/2015    History reviewed. No pertinent surgical history.   OB History   No obstetric history on file.       Home Medications    Prior to Admission medications   Medication Sig Start Date End Date Taking? Authorizing Provider  ARIPiprazole ER 400 MG SRER Inject 400 mg into the muscle every 28 (twenty-eight) days. (Due 11-28-16): For mood control Patient not taking: Reported on 10/27/2017 11/28/16   Armandina StammerNwoko, Agnes I, NP  ibuprofen (ADVIL,MOTRIN) 800 MG tablet Take 1 tablet (800 mg total) by mouth 2 (two) times daily. With food Patient not taking: Reported on 11/14/2017 11/02/17   Arthor CaptainHarris, Abigail, PA-C  lithium carbonate (LITHOBID) 300 MG CR tablet Take 1 tablet (300 mg total) by mouth every 12 (twelve) hours. Patient not taking: Reported on 11/02/2017 10/28/17   Laveda AbbeParks, Laurie Britton, NP  traZODone (DESYREL) 100 MG tablet Take 1 tablet (100 mg total) by mouth at bedtime. Patient not taking: Reported on 11/02/2017 10/28/17   Laveda AbbeParks, Laurie Britton, NP    Family History Family History  Problem Relation Age of Onset  . Mental illness Neg Hx     Social History Social History   Tobacco Use  . Smoking status: Current Every Day Smoker    Packs/day: 0.50    Types: Cigarettes  . Smokeless tobacco: Never Used  Substance Use Topics  . Alcohol use: Yes  . Drug use: Yes    Frequency: 7.0 times per week    Types: Marijuana    Comment: +THC     Allergies   Penicillins; Cogentin [benztropine]; Divalproex sodium; Risperidone; and Valproic acid   Review of Systems Review of Systems  Constitutional: Negative for chills and fever.  HENT: Negative for  ear pain and sore throat.   Eyes: Negative for pain and visual disturbance.  Respiratory: Negative for cough and shortness of breath.   Cardiovascular: Negative for chest pain and palpitations.  Gastrointestinal: Negative for abdominal pain and vomiting.  Genitourinary: Negative for dysuria and hematuria.  Musculoskeletal: Negative for arthralgias and back pain.  Skin: Negative for color change and rash.  Neurological: Negative for syncope.   Psychiatric/Behavioral: Positive for paranoia. Negative for suicidal ideas. The patient is nervous/anxious.   All other systems reviewed and are negative.    Physical Exam Updated Vital Signs  ED Triage Vitals  Enc Vitals Group     BP 05/16/18 0734 130/84     Pulse Rate 05/16/18 0734 77     Resp 05/16/18 0734 16     Temp 05/16/18 0734 98.1 F (36.7 C)     Temp Source 05/16/18 0734 Oral     SpO2 05/16/18 0734 100 %     Weight 05/16/18 0737 176 lb (79.8 kg)     Height 05/16/18 0737 6\' 1"  (1.854 m)     Head Circumference --      Peak Flow --      Pain Score 05/16/18 0737 0     Pain Loc --      Pain Edu? --      Excl. in GC? --     Physical Exam Vitals signs and nursing note reviewed.  Constitutional:      General: She is not in acute distress.    Appearance: She is well-developed.  HENT:     Head: Normocephalic and atraumatic.     Nose: Nose normal.     Mouth/Throat:     Mouth: Mucous membranes are moist.  Eyes:     Conjunctiva/sclera: Conjunctivae normal.     Pupils: Pupils are equal, round, and reactive to light.  Neck:     Musculoskeletal: Normal range of motion and neck supple.  Cardiovascular:     Rate and Rhythm: Normal rate and regular rhythm.     Pulses: Normal pulses.     Heart sounds: Normal heart sounds. No murmur.  Pulmonary:     Effort: Pulmonary effort is normal. No respiratory distress.     Breath sounds: Normal breath sounds.  Abdominal:     Palpations: Abdomen is soft.     Tenderness: There is no abdominal tenderness.  Musculoskeletal: Normal range of motion.  Skin:    General: Skin is warm and dry.     Capillary Refill: Capillary refill takes less than 2 seconds.  Neurological:     General: No focal deficit present.     Mental Status: She is alert.  Psychiatric:        Attention and Perception: She is inattentive.        Mood and Affect: Affect is labile.        Speech: Speech is rapid and pressured and tangential.        Behavior:  Behavior is hyperactive.        Thought Content: Thought content is paranoid and delusional. Thought content does not include homicidal or suicidal ideation. Thought content does not include homicidal or suicidal plan.        Judgment: Judgment is impulsive.      ED Treatments / Results  Labs (all labs ordered are listed, but only abnormal results are displayed) Labs Reviewed  COMPREHENSIVE METABOLIC PANEL  ETHANOL  RAPID URINE DRUG SCREEN, HOSP PERFORMED  CBC WITH DIFFERENTIAL/PLATELET  PREGNANCY, URINE  SALICYLATE LEVEL  ACETAMINOPHEN LEVEL  URINALYSIS, ROUTINE W REFLEX MICROSCOPIC    EKG None  Radiology No results found.  Procedures Procedures (including critical care time)  Medications Ordered in ED Medications - No data to display   Initial Impression / Assessment and Plan / ED Course  I have reviewed the triage vital signs and the nursing notes.  Pertinent labs & imaging results that were available during my care of the patient were reviewed by me and considered in my medical decision making (see chart for details).     Amy [name] is a 22 year old female with history of schizoaffective disorder, marijuana abuse, methamphetamine abuse who presents to the ED with concern for decompensated schizophrenia.  Patient with normal vitals.  No fever.  [facility] but states that she has not taken her medicine since she left.  Overall patient appears manic on exam.  She is tangential in her speech and overall difficult to redirect for questioning.  She has multiple delusional thoughts, paranoid thoughts and her rapid and pressured speech.  She is very labile in the room.  It appears that she has been noncompliant with her medications.  Possibly has continued to smoke marijuana.  Lives at housing with her parents.  Law Enforcement has brought the patient here.  Overall she appears physically okay.  Actually was seen at  another hospital today for knee pain and had Ace wrap applied.  She does not have any specific chest pain, shortness of breath.  We will get medical clearance labs and has psychiatry to evaluate.  It appears that patient may have act team and may need to get them involved.  This could be patient's chronic behavior but overall patient appears to be decompensated. Medical clearance labs were ordered and were overall unremarkable.  Awaiting final psychiatric evaluation.  Will fill out IVC until this work-up can be completed.  Labs okay. To be admitted to psych. Awaiting placement.   This chart was dictated using voice recognition software.  Despite best efforts to proofread,  errors can occur which can change the documentation meaning.   Final Clinical Impressions(s) / ED Diagnoses   Final diagnoses:  [facility](HCC)    ED Discharge Orders    None       Virgina NorfolkCuratolo, Masaye Gatchalian, DO 05/16/18 1056

## 2018-05-17 ENCOUNTER — Emergency Department (HOSPITAL_COMMUNITY)
Admission: EM | Admit: 2018-05-17 | Discharge: 2018-05-18 | Disposition: A | Discharge status: Other Acute Inpatient Hospital | Payer: Self-pay | Attending: Emergency Medicine | Admitting: Emergency Medicine

## 2018-05-17 DIAGNOSIS — F1290 Cannabis use, unspecified, uncomplicated: Secondary | ICD-10-CM | POA: Insufficient documentation

## 2018-05-17 DIAGNOSIS — F309 Manic episode, unspecified: Secondary | ICD-10-CM

## 2018-05-17 DIAGNOSIS — F17210 Nicotine dependence, cigarettes, uncomplicated: Secondary | ICD-10-CM | POA: Insufficient documentation

## 2018-05-17 DIAGNOSIS — F29 Unspecified psychosis not due to a substance or known physiological condition: Secondary | ICD-10-CM

## 2018-05-17 LAB — I-STAT BETA HCG BLOOD, ED (MC, WL, AP ONLY): I-stat hCG, quantitative: <5 m[IU]/mL (ref <5)

## 2018-05-17 LAB — CBC
HCT: 39.7 % (ref 36.0–46.0)
Hemoglobin: 13.1 g/dL (ref 12.0–15.0)
MCH: 32.8 pg (ref 26.0–34.0)
MCHC: 33 g/dL (ref 30.0–36.0)
MCV: 99.3 fL (ref 80.0–100.0)
Platelets: 328 10*3/uL (ref 150–400)
RBC: 4 MIL/uL (ref 3.87–5.11)
RDW: 12.5 % (ref 11.5–15.5)
WBC: 11 10*3/uL — AB (ref 4.0–10.5)
nRBC: 0 % (ref 0.0–0.2)

## 2018-05-17 LAB — RAPID URINE DRUG SCREEN, HOSP PERFORMED
Amphetamines: NOT DETECTED
Barbiturates: NOT DETECTED
Benzodiazepines: NOT DETECTED
Cocaine: NOT DETECTED
Opiates: NOT DETECTED
Tetrahydrocannabinol: POSITIVE — AB

## 2018-05-17 MED ORDER — LORAZEPAM 2 MG/ML IJ SOLN
2.0000 mg | Freq: Once | INTRAMUSCULAR | Status: AC
  Administered 2018-05-17: 2 mg via INTRAVENOUS
  Filled 2018-05-17: qty 1

## 2018-05-17 MED ORDER — HALOPERIDOL LACTATE 5 MG/ML IJ SOLN
5.0000 mg | Freq: Once | INTRAMUSCULAR | Status: AC
  Administered 2018-05-17: 5 mg via INTRAMUSCULAR
  Filled 2018-05-17: qty 1

## 2018-05-17 NOTE — ED Provider Notes (Signed)
[facility] and seems to be back to baseline.  No SI/HI and hallucinations are more chronic.  Also pt recently had med changes and not taking meds regularly.  Pt has an ACT team who will come and pick up the pt and take her home and pt also has a psychiatrist she can f/u with.   Gwyneth SproutPlunkett, Kaylee Wombles, MD 05/17/18 1149

## 2018-05-17 NOTE — ED Provider Notes (Addendum)
[facility] Provider Note   CSN: [id number] Arrival date & time: 05/17/18  2213     History   Chief Complaint Chief Complaint  Patient presents with  . Psychiatric Evaluation    HPI Amy [name] is a 22 y.o. female.  The history is provided by the patient and medical records. No language interpreter was used.  Mental Health Problem  Presenting symptoms: aggressive behavior, agitation, bizarre behavior, delusional and disorganized speech   Degree of incapacity (severity):  Severe Onset quality:  Gradual Timing:  Constant Progression:  Waxing and waning Chronicity:  Recurrent Context: drug abuse and noncompliance   Treatment compliance:  Untreated Relieved by:  Nothing Worsened by:  Nothing Ineffective treatments:  None tried Associated symptoms: no fatigue and no headaches     Past Medical History:  Diagnosis Date  . Cannabis abuse   . Depression   . 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 Schizo affective schizophrenia [facility](HCC)     Patient Active Problem List   Diagnosis Date Noted  . 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 Substance induced mood disorder (HCC) 11/14/2017  . 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 Substance abuse (HCC)   . Cocaine abuse with cocaine-induced mood disorder (HCC) 12/16/2016  . Bipolar disorder, curr episode mixed, severe, with psychotic features (HCC) 10/25/2016  . Cocaine use disorder, mild, abuse (HCC) 10/25/2016  . Cannabis use disorder, severe, dependence (HCC) 10/25/2016  . Insomnia   . Anxiety state   . Cocaine abuse (HCC) 07/04/2015  . Cannabis abuse, continuous 07/02/2015  . Non compliance with medical treatment 06/18/2015    No past surgical history on file.   OB History   No obstetric history on file.      Home Medications    Prior to Admission medications   Medication Sig Start Date End Date Taking? Authorizing Provider  ARIPiprazole ER 400  MG SRER Inject 400 mg into the muscle every 28 (twenty-eight) days. (Due 11-28-16): For mood control Patient not taking: Reported on 10/27/2017 11/28/16   Armandina StammerNwoko, Agnes I, NP  ibuprofen (ADVIL,MOTRIN) 800 MG tablet Take 1 tablet (800 mg total) by mouth 2 (two) times daily. With food Patient not taking: Reported on 11/14/2017 11/02/17   Arthor CaptainHarris, Abigail, PA-C  lithium carbonate (LITHOBID) 300 MG CR tablet Take 1 tablet (300 mg total) by mouth every 12 (twelve) hours. Patient not taking: Reported on 11/02/2017 10/28/17   Laveda AbbeParks, Laurie Britton, NP  traZODone (DESYREL) 100 MG tablet Take 1 tablet (100 mg total) by mouth at bedtime. Patient not taking: Reported on 11/02/2017 10/28/17   Laveda AbbeParks, Laurie Britton, NP    Family History Family History  Problem Relation Age of Onset  . Mental illness Neg Hx     Social History Social History   Tobacco Use  . Smoking status: Current Every Day Smoker    Packs/day: 0.50    Types: Cigarettes  . Smokeless tobacco: Never Used  Substance Use Topics  . Alcohol use: Not Currently  . Drug use: Yes    Frequency: 7.0 times per week    Types: Marijuana    Comment: +THC     Allergies   Penicillins; Cogentin [benztropine]; Divalproex sodium; Risperidone; and Valproic acid   Review of Systems Review of Systems  Constitutional: Negative for chills, diaphoresis, fatigue and fever.  HENT: Negative for congestion.   Eyes: Negative for visual disturbance.  Respiratory: Negative for chest tightness and shortness of breath.  Gastrointestinal: Negative for constipation, diarrhea, nausea and vomiting.  Genitourinary: Negative for dysuria.  Musculoskeletal: Negative for back pain, neck pain and neck stiffness.  Skin: Negative for rash and wound.  Neurological: Negative for light-headedness and headaches.  Psychiatric/Behavioral: Positive for agitation. Negative for confusion.  All other systems reviewed and are negative.    Physical Exam Updated Vital Signs BP (!)  141/95   Pulse (!) 107   Temp 98 F (36.7 C) (Oral)   Resp 16   SpO2 99%   Physical Exam Vitals signs and nursing note reviewed.  Constitutional:      General: She is not in acute distress.    Appearance: She is well-developed. She is not ill-appearing, toxic-appearing or diaphoretic.  HENT:     Head: Normocephalic and atraumatic.     Right Ear: External ear normal.     Left Ear: External ear normal.     Nose: Nose normal. No congestion or rhinorrhea.     Mouth/Throat:     Pharynx: No oropharyngeal exudate.  Eyes:     Conjunctiva/sclera: Conjunctivae normal.     Pupils: Pupils are equal, round, and reactive to light.  Neck:     Musculoskeletal: Normal range of motion and neck supple.  Cardiovascular:     Rate and Rhythm: Tachycardia present.     Heart sounds: No murmur.  Pulmonary:     Effort: Pulmonary effort is normal. No respiratory distress.     Breath sounds: No stridor. No wheezing, rhonchi or rales.  Chest:     Chest wall: No tenderness.  Abdominal:     General: There is no distension.     Tenderness: There is no abdominal tenderness. There is no rebound.  Skin:    General: Skin is warm.     Capillary Refill: Capillary refill takes less than 2 seconds.     Coloration: Skin is not jaundiced.     Findings: No erythema or rash.  Neurological:     General: No focal deficit present.     Mental Status: She is alert.     Cranial Nerves: No cranial nerve deficit or dysarthria.     Sensory: Sensation is intact.     Motor: Motor function is intact. No abnormal muscle tone.     Coordination: Coordination normal.     Gait: Gait is intact.     Deep Tendon Reflexes: Reflexes are normal and symmetric.     Comments: Patient moving all extremities.  Normal sensation all extremities.  Patient is alert.      ED Treatments / Results  Labs (all labs ordered are listed, but only abnormal results are displayed) Labs Reviewed  COMPREHENSIVE METABOLIC PANEL - Abnormal; Notable  for the following components:      Result Value   Alkaline Phosphatase 37 (*)    All other components within normal limits  ACETAMINOPHEN LEVEL - Abnormal; Notable for the following components:   Acetaminophen (Tylenol), Serum <10 (*)    All other components within normal limits  CBC - Abnormal; Notable for the following components:   WBC 11.0 (*)    All other components within normal limits  RAPID URINE DRUG SCREEN, HOSP PERFORMED - Abnormal; Notable for the following components:   Tetrahydrocannabinol POSITIVE (*)    All other components within normal limits  ETHANOL  SALICYLATE LEVEL  I-STAT BETA HCG BLOOD, ED ([facility], [facility], [facility] ONLY)    EKG None  Radiology No results found.  Procedures Procedures (including critical care time)  Medications Ordered in ED Medications  haloperidol lactate (HALDOL) injection 5 mg (5 mg Intramuscular Given 05/17/18 2311)  LORazepam (ATIVAN) injection 2 mg (2 mg Intravenous Given 05/17/18 2311)     Initial Impression / Assessment and Plan / ED Course  I have reviewed the triage vital signs and the nursing notes.  Pertinent labs & imaging results that were available during my care of the patient were reviewed by me and considered in my medical decision making (see chart for details).     Amy [name] is a 22 y.o. female has a history of schizoaffective disorder who was brought in for acute mania and psychosis.  Patient is having racing thoughts and is speaking rapidly.  Patient is visibly bouncing in her chair.  Patient is saying that people are living inside of her and she needs to feed them with water inside her.  She thinks she needs to go to [facility] to help "be a mediator".  She is not making sense.  Patient will answer questions about SI or HI.  Family told nursing she has not been taking her medications and is acutely psychotic.  Due to patient's inability to clearly describe what is going on and her clear mania and psychosis, patient was  placed under involuntary commitment.  On exam, patient's lungs were clear and chest was nontender.  Abdomen was nontender.  Patient was perseverating on needing to give people water inside her.  Patient will place under IVC.  Patient will screen laboratory testing for medical clearance however patient will need to talk with psychiatry.  As patient is still very agitated and psychotic, patient was given IM Haldol and Ativan and subsequently calm down.  EKG prior to administration showed normal QTC.  12:12 AM Patient resting calmly now.     Patient's laboratory testing was overall reassuring.  Place test negative.  No anemia.  Metabolic panel reassuring.  Patient denied taking any medicine since leaving the hospital earlier today.  Low suspicion that salicylate or Tylenol will be abnormal.  Patient is medically cleared for TTS evaluation.     TTS reports that patient is appropriate for reevaluation in a.m.  Holding orders placed.  Final Clinical Impressions(s) / ED Diagnoses   Final diagnoses:  Psychosis, unspecified psychosis type (HCC)  Mania (HCC)   Clinical Impression: 1. Psychosis, unspecified psychosis type (HCC)   2. Mania (HCC)     Disposition: Patient placed under IVC for mania and acute psychosis.  Awaiting TTS recommendations.  This note was prepared with assistance of Conservation officer, historic buildings. Occasional wrong-word or sound-a-like substitutions may have occurred due to the inherent limitations of voice recognition software.      Tegeler, Canary Brim, MD 05/18/18 0012    Tegeler, Canary Brim, MD 05/18/18 0300

## 2018-05-17 NOTE — Progress Notes (Addendum)
Patient is seen by me via tele-psych and I have consulted with Dr. Jama Flavorsobos.  Patient denies any suicidal homicidal ideations and denies any hallucinations.  Patient does admit to having some paranoid thoughts from time to time and she states that this is been ongoing for quite some time.  She also admits to not being compliant with her medications especially her lithium, but she also states that she is recently started her Abilify and that her shoulder is still been a little sore.  Patient states that she is ready to go home and states that she will be safe and not harm herself or anyone else.  [facility] which is her ACT team.  They have agreed to come and pick the patient up from the hospital she is discharged.  At this time patient does not meet inpatient criteria and is psychiatrically cleared.  TTS is contacted and [facility] and notified them to come pick the patient up.  I have contacted Dr. Anitra LauthPlunkett and notified her to the recommendations.  Marland Kitchen..Agree with NP Progress Note

## 2018-05-17 NOTE — BH Assessment (Signed)
[facility] Assessment Progress Note    Patient was seen today for re-assessment.  Patient denies SI/HI. Patient states that she continues to have some paranoid thoughts.  Patient was somewhat disorganized today.  Patient states that she has been eating and sleeping okay.  Because patient continues to have some paranoia and is disorganized, TTS/Soical Work will continue to seek placement for her.

## 2018-05-17 NOTE — ED Notes (Signed)
Pt changing into scrubs now. Obtained blood and sending off now.

## 2018-05-17 NOTE — ED Triage Notes (Signed)
Pt's mother Alen BlewLori Pehrson [phone] brought pt in and left.  Pt is actively psychotic, pressured speech, not oriented, granulosis thinking and a danger to self.

## 2018-05-17 NOTE — ED Notes (Signed)
Per TTS-ACT team to pick patient up from ED.  Patient given belongings and has gotten dressed.  Patient will wait in room for ACT team.

## 2018-05-17 NOTE — Progress Notes (Signed)
Pt is psychiatrically cleared per Reola Calkinsravis Money, NP. CSW contacted pt's ACT team ([facility]) and notified staff of disposition. Staff voiced understanding that pt would need to be picked up from ED by ACT team member.   Wells GuilesSarah Vernice Bowker, LCSW, LCAS Disposition CSW [facility]/TTS [phone]

## 2018-05-17 NOTE — ED Notes (Signed)
Pt using phone for calling mother. REassessment by completed by telepsych,.

## 2018-05-18 ENCOUNTER — Inpatient Hospital Stay (HOSPITAL_COMMUNITY)
Admission: AD | Admit: 2018-05-18 | Discharge: 2018-05-29 | DRG: 885 | Disposition: A | Discharge status: Home / Self Care | Payer: Federal, State, Local not specified - Other | Attending: Psychiatry | Admitting: Psychiatry

## 2018-05-18 ENCOUNTER — Other Ambulatory Visit: Payer: Self-pay

## 2018-05-18 ENCOUNTER — Encounter (HOSPITAL_COMMUNITY): Payer: Self-pay | Admitting: Behavioral Health

## 2018-05-18 DIAGNOSIS — F419 Anxiety disorder, unspecified: Secondary | ICD-10-CM | POA: Diagnosis present

## 2018-05-18 DIAGNOSIS — F250 Schizoaffective disorder, bipolar type: Secondary | ICD-10-CM | POA: Diagnosis present

## 2018-05-18 DIAGNOSIS — Z880 Allergy status to penicillin: Secondary | ICD-10-CM | POA: Diagnosis not present

## 2018-05-18 DIAGNOSIS — Z9119 Patient's noncompliance with other medical treatment and regimen: Secondary | ICD-10-CM

## 2018-05-18 DIAGNOSIS — Z888 Allergy status to other drugs, medicaments and biological substances status: Secondary | ICD-10-CM

## 2018-05-18 DIAGNOSIS — Z79899 Other long term (current) drug therapy: Secondary | ICD-10-CM

## 2018-05-18 DIAGNOSIS — R451 Restlessness and agitation: Secondary | ICD-10-CM | POA: Diagnosis not present

## 2018-05-18 DIAGNOSIS — Z79891 Long term (current) use of opiate analgesic: Secondary | ICD-10-CM | POA: Diagnosis not present

## 2018-05-18 DIAGNOSIS — Z716 Tobacco abuse counseling: Secondary | ICD-10-CM | POA: Diagnosis not present

## 2018-05-18 DIAGNOSIS — Z9114 Patient's other noncompliance with medication regimen: Secondary | ICD-10-CM | POA: Diagnosis not present

## 2018-05-18 DIAGNOSIS — G4700 Insomnia, unspecified: Secondary | ICD-10-CM | POA: Diagnosis present

## 2018-05-18 DIAGNOSIS — F17210 Nicotine dependence, cigarettes, uncomplicated: Secondary | ICD-10-CM | POA: Diagnosis present

## 2018-05-18 DIAGNOSIS — F259 Schizoaffective disorder, unspecified: Secondary | ICD-10-CM | POA: Diagnosis present

## 2018-05-18 DIAGNOSIS — Z560 Unemployment, unspecified: Secondary | ICD-10-CM | POA: Diagnosis not present

## 2018-05-18 DIAGNOSIS — F12159 Cannabis abuse with psychotic disorder, unspecified: Secondary | ICD-10-CM | POA: Diagnosis present

## 2018-05-18 LAB — COMPREHENSIVE METABOLIC PANEL
ALT: 10 U/L (ref 0–44)
AST: 15 U/L (ref 15–41)
Albumin: 4.3 g/dL (ref 3.5–5.0)
Alkaline Phosphatase: 37 U/L — ABNORMAL LOW (ref 38–126)
Anion gap: 8 (ref 5–15)
BUN: 9 mg/dL (ref 6–20)
CO2: 27 mmol/L (ref 22–32)
Calcium: 9.1 mg/dL (ref 8.9–10.3)
Chloride: 107 mmol/L (ref 98–111)
Creatinine, Ser: 0.99 mg/dL (ref 0.44–1.00)
Glucose, Bld: 88 mg/dL (ref 70–99)
Potassium: 3.7 mmol/L (ref 3.5–5.1)
Sodium: 142 mmol/L (ref 135–145)
Total Bilirubin: 0.3 mg/dL (ref 0.3–1.2)
Total Protein: 7.1 g/dL (ref 6.5–8.1)

## 2018-05-18 LAB — ETHANOL: Alcohol, Ethyl (B): <10 mg/dL (ref <10)

## 2018-05-18 LAB — SALICYLATE LEVEL: Salicylate Lvl: <7 mg/dL (ref 2.8–30.0)

## 2018-05-18 LAB — ACETAMINOPHEN LEVEL: Acetaminophen (Tylenol), Serum: <10 ug/mL — ABNORMAL LOW (ref 10–30)

## 2018-05-18 MED ORDER — OLANZAPINE 5 MG PO TBDP
5.0000 mg | ORAL_TABLET | Freq: Three times a day (TID) | ORAL | Status: DC | PRN
  Administered 2018-05-18 – 2018-05-24 (×8): 5 mg via ORAL
  Filled 2018-05-18 (×6): qty 1

## 2018-05-18 MED ORDER — OLANZAPINE 5 MG PO TBDP
5.0000 mg | ORAL_TABLET | Freq: Two times a day (BID) | ORAL | Status: DC
  Administered 2018-05-18 – 2018-05-19 (×2): 5 mg via ORAL
  Filled 2018-05-18 (×6): qty 1

## 2018-05-18 MED ORDER — ALUM & MAG HYDROXIDE-SIMETH 200-200-20 MG/5ML PO SUSP: 30.0000 mL | ORAL | Status: DC | PRN

## 2018-05-18 MED ORDER — OLANZAPINE 5 MG PO TBDP
5.0000 mg | ORAL_TABLET | Freq: Two times a day (BID) | ORAL | Status: DC | PRN
  Administered 2018-05-18: 5 mg via ORAL
  Filled 2018-05-18: qty 1

## 2018-05-18 MED ORDER — ZIPRASIDONE MESYLATE 20 MG IM SOLR
20.0000 mg | INTRAMUSCULAR | Status: DC | PRN
  Filled 2018-05-18: qty 20

## 2018-05-18 MED ORDER — ACETAMINOPHEN 325 MG PO TABS
650.0000 mg | ORAL_TABLET | Freq: Four times a day (QID) | ORAL | Status: DC | PRN
  Filled 2018-05-18: qty 2

## 2018-05-18 MED ORDER — LITHIUM CARBONATE ER 300 MG PO TBCR
300.0000 mg | EXTENDED_RELEASE_TABLET | Freq: Two times a day (BID) | ORAL | Status: DC
  Administered 2018-05-18 – 2018-05-26 (×16): 300 mg via ORAL
  Filled 2018-05-18 (×20): qty 1

## 2018-05-18 MED ORDER — MAGNESIUM HYDROXIDE 400 MG/5ML PO SUSP: 30.0000 mL | Freq: Every day | ORAL | Status: DC | PRN

## 2018-05-18 MED ORDER — TRAZODONE HCL 100 MG PO TABS
100.0000 mg | ORAL_TABLET | Freq: Every day | ORAL | Status: DC
  Filled 2018-05-18: qty 1

## 2018-05-18 MED ORDER — NICOTINE POLACRILEX 2 MG MT GUM
2.0000 mg | CHEWING_GUM | OROMUCOSAL | Status: DC | PRN
  Administered 2018-05-18 – 2018-05-21 (×3): 2 mg via ORAL
  Filled 2018-05-18 (×3): qty 1

## 2018-05-18 MED ORDER — LORAZEPAM 1 MG PO TABS
1.0000 mg | ORAL_TABLET | ORAL | Status: AC | PRN
  Administered 2018-05-19: 1 mg via ORAL
  Filled 2018-05-18 (×2): qty 1

## 2018-05-18 MED ORDER — TRAZODONE HCL 100 MG PO TABS
100.0000 mg | ORAL_TABLET | Freq: Every day | ORAL | Status: DC
  Administered 2018-05-18 – 2018-05-23 (×2): 100 mg via ORAL
  Filled 2018-05-18 (×13): qty 1

## 2018-05-18 NOTE — Progress Notes (Addendum)
CSW received call back from [place]ir[place]a ([facility]) stating that she did not know if it would be best for her to come in to assess patient as she is running a fever but stated that from the sound of information given in the assessment that patient is at her baseline. Inetta Fermoina was informed that with that being the case, discharge would be recommended by provider. Inetta Fermoina stated that she was going to try to get in contact with patient's mother again as housing potentially will be an issue if mother will not allow her to come back. Inetta Fermoina stated that she will follow up with CSW.   Vilma MeckelEarl R. Algis GreenhouseForbes, MSW, LCSW Clinical Social Work/Disposition Phone: [phone] Fax: [phone]

## 2018-05-18 NOTE — Progress Notes (Signed)
Pt admitted to the adult unit [facility] On admission, pt speech noted to be tangential and thoughts are disorganized. Pt stated that she was pregnant with 10 babies that she's ovulating. Pt then began to talk about aliens and how they are on a mission and that she needs to talk to them about the mission. Pt stated [place]. Pt expressed that [place]. Pt denies AVH but reported having paranoia from the antennas inside of her. Pt denies SI/HI. Pt safety taken onto the unit and report given to Kelton PillarMichael S., RN.

## 2018-05-18 NOTE — B[facility] Counselor (Signed)
[facility]  [facility] accepting Dr. Jeannine KittenFarah.  Information provided to Nurse Thurston Poundsrey to call report.

## 2018-05-18 NOTE — Progress Notes (Addendum)
Contacted ACT team today,[facility], and they stated that patient is at baseline for being untreated.  They feel that patient needs to be stabilized in the hospital and there are issues at her house with her mom.  ACT team reports that the mom does not want her to live with her and that they are a lot of issues between mom and patient.  After reviewing the chart patient has had approximately 12 ED visits and 4 admissions since July along with being in jail for approximately 2 to 3 months.  Patient was started on Abilify maintain but the act team could not give me a definite start date and requested him to give his medications as soon as possible.  Would recommend patient to be hospitalized for stability.  Marland Kitchen..Agree with NP Progress Note

## 2018-05-18 NOTE — [facility] Suicide Risk Assessment (Signed)
[facility]   Nursing information obtained from:  Patient Demographic factors:  Low socioeconomic status, Unemployed Current Mental Status:  NA Loss Factors:  NA Historical Factors:  Impulsivity Risk Reduction Factors:  Sense of responsibility to family, Religious beliefs about death  Total Time spent with patient: 45 minutes Principal Problem: Schizoaffective disorder, substance abuse (cannabis, stimulants) Diagnosis:  Active Problems:   Schizoaffective psychosis (HCC)  Subjective Data:   Continued Clinical Symptoms:  Alcohol Use Disorder Identification Test Final Score (AUDIT): 0 The "Alcohol Use Disorders Identification Test", Guidelines for Use in Primary Care, Second Edition.  World Science writerHealth Organization [facility](WHO). Score between 0-7:  no or low risk or alcohol related problems. Score between 8-15:  moderate risk of alcohol related problems. Score between 16-19:  high risk of alcohol related problems. Score 20 or above:  warrants further diagnostic evaluation for alcohol dependence and treatment.   CLINICAL FACTORS:  22 year old female, presented to the emergency room on December 26 with disorganized behavior, disorganized thought process, bizarre ideations/delusions.  Presents as limited historian and with significant thought disorder, bizarre delusions.  She describes a history of chronic mental illness and has been diagnosed with bipolar disorder and schizoaffective disorder in the past.  As per chart [facility].  Chart indicates history of decompensations related to substance abuse (cannabis, stimulants) and medication noncompliance. Patient reports history of good response to lithium in the past.     Psychiatric Specialty Exam: Physical Exam  ROS  Blood pressure 103/70, pulse (!) 104, temperature 98.6 F (37 C), temperature source Oral, resp. rate 18, height 6\' 1"  (1.854 m), weight 87.1 kg, SpO2  100 %.Body mass index is 25.33 kg/m.   see admit note MSE  COGNITIVE FEATURES THAT CONTRIBUTE TO RISK:  Closed-mindedness and Loss of executive function    SUICIDE RISK:   Moderate:  Frequent suicidal ideation with limited intensity, and duration, some specificity in terms of plans, no associated intent, good self-control, limited dysphoria/symptomatology, some risk factors present, and identifiable protective factors, including available and accessible social support.  PLAN OF CARE: Patient will be admitted to inpatient psychiatric unit for stabilization and safety. Will provide and encourage milieu participation. Provide medication management and maked adjustments as needed.  Will follow daily.    I certify that inpatient services furnished can reasonably be expected to improve the patient's condition.   Craige CottaFernando A Florenda Watt, MD 05/18/2018, 6:17 PM

## 2018-05-18 NOTE — ED Notes (Signed)
[facility]

## 2018-05-18 NOTE — ED Notes (Signed)
Breakfast ordered 

## 2018-05-18 NOTE — ED Notes (Signed)
[employer] present, vitals collected and [facility], Pt and her belongings left with officers.

## 2018-05-18 NOTE — Progress Notes (Signed)
CSW contacted [facility] ACTT to advise of patient reentry into emergency room, as she was discharged to them on yesterday, and inform of [facility] from [facility] that she be assessed by them to see if this is her baseline. Inetta Fermoina from [facility] is contacting her supervisor regarding this request as she reports that patient continues to be med non-compliant and voices concerns regarding patient disposition home if mother has brought her into the hospital again. Inetta Fermoina was given contact information for CSW to follow up regarding this contact.  Vilma MeckelEarl R. Algis GreenhouseForbes, MSW, LCSW Clinical Social Work/Disposition Phone: [phone] Fax: [phone]

## 2018-05-18 NOTE — ED Notes (Signed)
Breakfast ordered reg 

## 2018-05-18 NOTE — Progress Notes (Signed)
CSW spoke with Amy [name] ([facility] Team Lead) who voiced concerns regarding housing and med non-compliance. He reports that patient does not really have anywhere to live but that ACTT is working on housing but that it does sound like her baseline. He recommends inpatient treatment in order to get stabilization on medications. Charles left his information to be contacted by the provider regarding recommendations.   Amy [name], MSW, LCSW Clinical Social Work/Disposition Phone: [phone] Fax: [phone]
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
## 2018-05-18 NOTE — H&P (Signed)
Psychiatric Admission Assessment Adult  Patient Identification: Amy [name] MRN:  [id number] Date of Evaluation:  05/18/2018 Chief Complaint: " I told my mom not [facility]" Principal Diagnosis: Schizoaffective Disorder, Bipolar Type. Diagnosis:  Active Problems:   Schizoaffective psychosis (HCC)  History of Present Illness: Patient is a 22 year old female.  Presented to the emergency room voluntarily on December 26.  Presented with disorganized behavior, disorganized thought process, bizarre ideations. Patient is a limited historian and presents with significant thought disorder, pressured speech, tangentiality, delusional ideations.  During session makes bizarre statements such as "I'm supposed to marry the alien, we need a good aliens , not 4 July ones , like Amy [name] said" " I have a little baby with an antenna in me, it bounces", " no babies are allowed at the [facility]"  " the first floor is like arcades but the third is like [employer]" Although disorganized in speech and thought process as noted above, she does endorse a history of mental illness, prior outpatient psychiatric treatment, and medication non compliance x several weeks to months, and reports she realizes she needs help " to get better". She states she has been depressed, sad, but denies suicidal ideations and currently contracts for safety on the unit. She also reports Cannabis abuse, and has been using regularly. Admission UDS positive for cannabis. Admission BAL negative. She has a history of stimulant abuse, but denies any recent use .  *Of note, does request to be prescribed Modafanil stating " it is really good for me", although denies recent use.      Associated Signs/Symptoms: Depression Symptoms: reports anhedonia and poor sleep. States she has been sleeping 2 -3 hours per night. (Hypo) Manic Symptoms:  Pressured speech, flight of ideations, poor sleep. Anxiety Symptoms:  States she has been anxious   Psychotic Symptoms:  Denies hallucinations . (+) thought disorder and delusions as above  PTSD Symptoms: Does not currently endorse  Total Time spent with patient: 45 minutes  Past Psychiatric History: patient endorses history of mental illness and prior psychiatric admissions , but cannot specify when her last admission was .  Chart review indicates prior diagnosis of Bipolar Disorder , Schizoaffective Disorder , Substance Abuse, with history of decompensating in the context of cannabis and stimulant abuse. She had a  recent psychiatric admission at [facility] for affective decompensation, delusions, cannabis abuse. Was most recently discharged on Abilify 20 mgrs QDAY , Abilify Maintena, with next dose being scheduled for 06/06/18, Wellbutrin SR 150 mgrs QDAY . She denies history of suicide attempts    Is the patient at risk to self? Yes.    Has the patient been a risk to self in the past 6 months? No.  Has the patient been a risk to self within the distant past? No.  Is the patient a risk to others? No.  Has the patient been a risk to others in the past 6 months? No.  Has the patient been a risk to others within the distant past? No.   Prior Inpatient Therapy:  as above Prior Outpatient Therapy:  states she had been seeing Dr. Jeannine Kitten for psychiatric treatment  Alcohol Screening: 1. How often do you have a drink containing alcohol?: Never 2. How many drinks containing alcohol do you have on a typical day when you are drinking?: 1 or 2 3. How often do you have six or more drinks on one occasion?: Never AUDIT-C Score: 0 4. How often during the last  year have you found that you were not able to stop drinking once you had started?: Never 5. How often during the last year have you failed to do what was normally expected from you becasue of drinking?: Never 6. How often during the last year have you needed a first drink in the morning to get yourself going after a heavy  drinking session?: Never 7. How often during the last year have you had a feeling of guilt of remorse after drinking?: Never 8. How often during the last year have you been unable to remember what happened the night before because you had been drinking?: Never 9. Have you or someone else been injured as a result of your drinking?: No 10. Has a relative or friend or a doctor or another health worker been concerned about your drinking or suggested you cut down?: No Alcohol Use Disorder Identification Test Final Score (AUDIT): 0 Intervention/Follow-up: AUDIT Score <7 follow-up not indicated Substance Abuse History in the last 12 months:  Reports Cannabis Abuse. History of Methamphetamine Abuse, but denies recent use. Denies alcohol , BZD, or Opiate Abuse  Consequences of Substance Abuse: Psychosis Previous Psychotropic Medications: patient reports she has been on Lithium in the past . States she has not been taking medications recently. Chart notes , as above, indicate she has recently been prescribed Abilify and that she was started on Abilify Maintena, with her next dose scheduled for 06/06/18. Psychological Evaluations: No  Past Medical History: Denies medical illnesses . Currently denies allergies, but chart reports allergy to PCN. Also reports Risperidone causing " coughing", Depakote worsening psychiatric symptoms. Past Medical History:  Diagnosis Date  . Cannabis abuse   . Depression   . 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 Schizo affective schizophrenia (HCC)    History reviewed. No pertinent surgical history. Family History: Difficult to obtain at this time due to patient's presentation Family History  Problem Relation Age of Onset  . Mental illness Neg Hx    Family Psychiatric  History:none endorsed  Tobacco Screening: Have you used any form of tobacco in the last 30 days? (Cigarettes, Smokeless Tobacco, Cigars, and/or Pipes): Yes Tobacco use, Select all that apply: 5 or more cigarettes  per day Are you interested in Tobacco Cessation Medications?: Yes, will notify MD for an order Counseled patient on smoking cessation including recognizing danger situations, developing coping skills and basic information about quitting provided: Yes Social History: single, no children,lives with parents, unemployed  Social History   Substance and Sexual Activity  Alcohol Use Not Currently     Social History   Substance and Sexual Activity  Drug Use Yes  . Frequency: 7.0 times per week  . Types: Marijuana   Comment: +THC    Additional Social History:  Allergies:   Allergies  Allergen Reactions  . Penicillins Rash    Has patient had a PCN reaction causing immediate rash, facial/tongue/throat swelling, SOB or lightheadedness with hypotension: Yes Has patient had a PCN reaction causing severe rash involving mucus membranes or skin necrosis: No Has patient had a PCN reaction that required hospitalization: No Has patient had a PCN reaction occurring within the last 10 years: Yes If all of the above answers are "NO", then may   . Cogentin [Benztropine] Itching  . Divalproex Sodium Other (See Comments)    Creates feelings of paranoia, some suicidal feelings, and makes her feel that "people are coming after" her Creates feelings of paranoia, some suicidal feelings, and makes her feel that "people  are coming after" her  . Risperidone Other (See Comments)    "Makes me cough"  . Valproic Acid Other (See Comments)    Creates paranoia/Per [facility] Creates paranoia/Per [facility]   Lab Results:  Results for orders placed or performed during the hospital encounter of 05/17/18 (from the past 48 hour(s))  Comprehensive metabolic panel     Status: Abnormal   Collection Time: 05/17/18 10:36 PM  Result Value Ref Range   Sodium 142 135 - 145 mmol/L   Potassium 3.7 3.5 - 5.1 mmol/L   Chloride 107 98 - 111 mmol/L   CO2 27 22 - 32 mmol/L   Glucose, Bld 88 70 - 99 mg/dL   BUN 9 6 -  20 mg/dL   Creatinine, Ser 1.61 0.44 - 1.00 mg/dL   Calcium 9.1 8.9 - 09.6 mg/dL   Total Protein 7.1 6.5 - 8.1 g/dL   Albumin 4.3 3.5 - 5.0 g/dL   AST 15 15 - 41 U/L   ALT 10 0 - 44 U/L   Alkaline Phosphatase 37 (L) 38 - 126 U/L   Total Bilirubin 0.3 0.3 - 1.2 mg/dL   GFR calc non Af Amer >60 >60 mL/min   GFR calc Af Amer >60 >60 mL/min   Anion gap 8 5 - 15    Comment: Performed at [facility], [address], [place], [place] [address]  Ethanol     Status: None   Collection Time: 05/17/18 10:36 PM  Result Value Ref Range   Alcohol, Ethyl (B) <10 <10 mg/dL    Comment: (NOTE) Lowest detectable limit for serum alcohol is 10 mg/dL. For medical purposes only. Performed at [facility], [address] [place], [place] [address]   Salicylate level     Status: None   Collection Time: 05/17/18 10:36 PM  Result Value Ref Range   Salicylate Lvl <7.0 2.8 - 30.0 mg/dL    Comment: Performed at [facility], [address], [place], [place] [address]  Acetaminophen level     Status: Abnormal   Collection Time: 05/17/18 10:36 PM  Result Value Ref Range   Acetaminophen (Tylenol), Serum <10 (L) 10 - 30 ug/mL    Comment: (NOTE) Therapeutic concentrations vary significantly. A range of 10-30 ug/mL  may be an effective concentration for many patients. However, some  are best treated at concentrations outside of this range. Acetaminophen concentrations >150 ug/mL at 4 hours after ingestion  and >50 ug/mL at 12 hours after ingestion are often associated with  toxic reactions. Performed at [facility], [address] [place], [place] [address]   cbc     Status: Abnormal   Collection Time: 05/17/18 10:36 PM  Result Value Ref Range   WBC 11.0 (H) 4.0 - 10.5 K/uL   RBC 4.00 3.87 - 5.11 MIL/uL   Hemoglobin 13.1 12.0 - 15.0 g/dL   HCT 30.8 65.7 - 84.6 %   MCV 99.3 80.0 - 100.0 fL   MCH 32.8 26.0 - 34.0 pg   MCHC 33.0 30.0 - 36.0 g/dL   RDW 96.2 95.2 - 84.1 %    Platelets 328 150 - 400 K/uL   nRBC 0.0 0.0 - 0.2 %    Comment: Performed at [facility], [address], [place], [place] [address]  I-Stat beta hCG blood, ED     Status: None   Collection Time: 05/17/18 10:50 PM  Result Value Ref Range  I-stat hCG, quantitative <5.0 <5 mIU/mL   Comment 3            Comment:   GEST. AGE      CONC.  (mIU/mL)   <=1 WEEK        5 - 50     2 WEEKS       50 - 500     3 WEEKS       100 - 10,000     4 WEEKS     1,000 - 30,000        FEMALE AND NON-PREGNANT FEMALE:     LESS THAN 5 mIU/mL   Rapid urine drug screen (hospital performed)     Status: Abnormal   Collection Time: 05/17/18 10:55 PM  Result Value Ref Range   Opiates NONE DETECTED NONE DETECTED   Cocaine NONE DETECTED NONE DETECTED   Benzodiazepines NONE DETECTED NONE DETECTED   Amphetamines NONE DETECTED NONE DETECTED   Tetrahydrocannabinol POSITIVE (A) NONE DETECTED   Barbiturates NONE DETECTED NONE DETECTED    Comment: (NOTE) DRUG SCREEN FOR MEDICAL PURPOSES ONLY.  IF CONFIRMATION IS NEEDED FOR ANY PURPOSE, NOTIFY LAB WITHIN 5 DAYS. LOWEST DETECTABLE LIMITS FOR URINE DRUG SCREEN Drug Class                     Cutoff (ng/mL) Amphetamine and metabolites    1000 Barbiturate and metabolites    200 Benzodiazepine                 200 Tricyclics and metabolites     300 Opiates and metabolites        300 Cocaine and metabolites        300 THC                            50 Performed at [facility], [address] [place], [place] [address]     Blood Alcohol level:  Lab Results  Component Value Date   [facility]   ETH <10 05/16/2018    Metabolic Disorder Labs:  Lab Results  Component Value Date   HGBA1C 4.8 10/25/2016   MPG 91 10/25/2016   MPG 91 12/03/2015   Lab Results  Component Value Date   PROLACTIN 33.3 (H) 10/25/2016   PROLACTIN 66.9 (H) 12/03/2015   Lab Results  Component Value Date   CHOL 123 10/25/2016   TRIG 115 10/25/2016   HDL 39 (L)  10/25/2016   CHOLHDL 3.2 10/25/2016   VLDL 23 10/25/2016   LDLCALC 61 10/25/2016   LDLCALC 50 12/03/2015    Current Medications: Current Facility-Administered Medications  Medication Dose Route Frequency Provider Last Rate Last Dose  . acetaminophen (TYLENOL) tablet 650 mg  650 mg Oral Q6H PRN Maryagnes AmosStarkes-Perry, Takia S, FNP      . alum & mag hydroxide-simeth (MAALOX/MYLANTA) 200-200-20 MG/5ML suspension 30 mL  30 mL Oral Q4H PRN Starkes-Perry, Juel Burrowakia S, FNP      . lithium carbonate (LITHOBID) CR tablet 300 mg  300 mg Oral Q12H Starkes-Perry, Juel Burrowakia S, FNP      . magnesium hydroxide (MILK OF MAGNESIA) suspension 30 mL  30 mL Oral Daily PRN Starkes-Perry, Juel Burrowakia S, FNP      . nicotine polacrilex (NICORETTE) gum 2 mg  2 mg Oral PRN , Rockey SituFernando A, MD   2 mg at 05/18/18 1638  . OLANZapine zydis (ZYPREXA) disintegrating tablet 5 mg  5  mg Oral BID PRN Maryagnes Amos, FNP   5 mg at 05/18/18 1637  . traZODone (DESYREL) tablet 100 mg  100 mg Oral QHS Maryagnes Amos, FNP       PTA Medications: Medications Prior to Admission  Medication Sig Dispense Refill Last Dose  . ARIPiprazole ER 400 MG SRER Inject 400 mg into the muscle every 28 (twenty-eight) days. (Due 11-28-16): For mood control (Patient not taking: Reported on 10/27/2017) 1 each 0 Not Taking at Unknown time  . ibuprofen (ADVIL,MOTRIN) 800 MG tablet Take 1 tablet (800 mg total) by mouth 2 (two) times daily. With food (Patient not taking: Reported on 11/14/2017) 12 tablet 0 Completed Course at Unknown time  . lithium carbonate (LITHOBID) 300 MG CR tablet Take 1 tablet (300 mg total) by mouth every 12 (twelve) hours. (Patient not taking: Reported on 11/02/2017) 28 tablet 0 Not Taking at Unknown time  . traZODone (DESYREL) 100 MG tablet Take 1 tablet (100 mg total) by mouth at bedtime. (Patient not taking: Reported on 11/02/2017) 14 tablet 0 Not Taking at Unknown time    Musculoskeletal: Strength & Muscle Tone: within normal  limits Gait & Station: normal Patient leans: [name]  Psychiatric Specialty Exam: Physical Exam  Review of Systems  Constitutional: Negative.   HENT: Negative.   Eyes: Negative.   Respiratory: Negative.   Cardiovascular: Negative.   Gastrointestinal: Negative.   Genitourinary: Negative.   Musculoskeletal: Negative.   Skin: Negative.   Neurological: Negative.   Endo/Heme/Allergies: Negative.   Psychiatric/Behavioral: Positive for depression, hallucinations and substance abuse. The patient has insomnia.   All other systems reviewed and are negative.   Blood pressure 103/70, pulse (!) 104, temperature 98.6 F (37 C), temperature source Oral, resp. rate 18, height 6\' 1"  (1.854 m), weight 87.1 kg, SpO2 100 %.Body mass index is 25.33 kg/m.  General Appearance: Fairly Groomed  Eye Contact:  Fair  Speech:  Pressured  Volume:  Normal  Mood:  reports feeling depressed, labile  Affect:  labile, not irritable  Thought Process:  Disorganized and Descriptions of Associations: Tangential- loose at times   Orientation:  Other:  she is alert, attentive, and is oriented x 3 at this time   Thought Content:  denies hallucinations- + thought disorder and bizarre ideations/delusions   Suicidal Thoughts:  No currently denies suicidal ideations , denies self injurious ideations, contracts for safety on unit, denies homicidal or violent ideations  Homicidal Thoughts:  No  Memory:  recent and remote fair  Judgement:  Impaired  Insight:  Shallow  Psychomotor Activity:  some restlessness, but in general able to sit throughout session  Concentration:  Concentration: Fair and Attention Span: Fair  Recall:  [employer] of Knowledge:  Fair  Language:  Fair  Akathisia:  Negative  Handed:  Right  AIMS (if indicated):     Assets:  Desire for Improvement Resilience  ADL's:  Intact  Cognition:  WNL  Sleep:       Treatment Plan Summary: Daily contact with patient to assess and evaluate symptoms and  progress in treatment, Medication management, Plan inpatient treatment  and medications as below  Observation Level/Precautions:  15 minute checks  Laboratory: labs reviewed. Of note, 12/26 pregnancy test negative. UDS positive for Cannabis, BAL negative. 12/27 EKG- NSR , HR 58, QTc 410  Psychotherapy:  Milieu, group therapy   Medications: We reviewed options - as per chart notes, on oral Abilify until very recently , but presents with worsening psychosis. Would  consider Zyprexa trial. Patient does state she remembers Dierdre SearlesLi as helpful in the past . Zyprexa Zydis 5 mgrs BID Lithium Carbonate 300 mgrs BID Will not resume Wellbutrin or other antidepressant at this time due to manic presentation Agitation protocol ( Zyprexa, Ativan, Geodon IM) if  needed for psychotic agitation  Consultations:  -   Discharge Concerns:  -   Estimated LOS: 7 days   Other:     Physician Treatment Plan for Primary Diagnosis:  Schizoaffective Disorder, Bipolar Type versus Bipolar Disorder- Mixed/Manic Long Term Goal(s): Improvement in symptoms so as ready for discharge  Short Term Goals: Ability to identify changes in lifestyle to reduce recurrence of condition will improve and Ability to maintain clinical measurements within normal limits will improve  Physician Treatment Plan for Secondary Diagnosis: Cannabis , Stimulant Abuse  Long Term Goal(s): Improvement in symptoms so as ready for discharge  Short Term Goals: Ability to identify changes in lifestyle to reduce recurrence of condition will improve, Ability to verbalize feelings will improve, Ability to disclose and discuss suicidal ideas, Ability to demonstrate self-control will improve, Ability to identify and develop effective coping behaviors will improve and Ability to maintain clinical measurements within normal limits will improve  I certify that inpatient services furnished can reasonably be expected to improve the patient's condition.    Craige CottaFernando A ,  MD 12/28/20195:29 PM

## 2018-05-18 NOTE — ED Notes (Signed)
IVC papers served, copies on chart, [facility] and copies sent medical Records.

## 2018-05-18 NOTE — Tx Team (Signed)
Initial Treatment Plan 05/18/2018 1:39 PM Trinidee Hedda Slade Daw [id number]    PATIENT STRESSORS: Marital or family conflict Medication change or noncompliance   PATIENT STRENGTHS: Ability for insight Motivation for treatment/growth   PATIENT IDENTIFIED PROBLEMS: "coping skills"  "paranoia"                   DISCHARGE CRITERIA:  Ability to meet basic life and health needs Adequate post-discharge living arrangements Improved stabilization in mood, thinking, and/or behavior  PRELIMINARY DISCHARGE PLAN: Attend aftercare/continuing care group Attend PHP/IOP  PATIENT/FAMILY INVOLVEMENT: This treatment plan has been presented to and reviewed with the patient, Amy [name], and/or family member.  The patient and family have been given the opportunity to ask questions and make suggestions.  Layla BarterWhite, Shantale Holtmeyer L, RN 05/18/2018, 1:39 PM

## 2018-05-18 NOTE — ED Notes (Signed)
Patient moved from 003 to 054. Alert and calm now.

## 2018-05-18 NOTE — ED Notes (Signed)
Pt's belongings placed in locker 2, needs to be inventoried.

## 2018-05-18 NOTE — Progress Notes (Signed)
Pt observe talking to self in the dayroom. Pt appears labile/manic/restless/disorganized/tangential with flight of ideas. Pt denies SI/HI/AVH/Pain at this time. Pt state "Have you seen Mellody DanceKeith; Where is he"? Pt had to be redirected multiple times throughout the evening. Pt was preoccupied with taking "the pill that dissolved under my tongue". Pt kept asking for food/snacks which was provided to her. Support and encouragement provided. Will continue with POC.

## 2018-05-18 NOTE — Progress Notes (Signed)
Writer unable to release sign and held orders. Admission and medication orders obtained from Wyvonna Plumakia, Perry, NP. Per Fredna Dowakia, NP., writer may order Trazodone 100 mg daily at bedtime for sleep, resume Lithium CR 300 mg every 12 hours, and start Zyprexa Zydis 5 mg BID PRN for psychosis.

## 2018-05-19 LAB — LIPID PANEL
CHOLESTEROL: 110 mg/dL (ref 0–200)
HDL: 42 mg/dL (ref >40)
LDL Cholesterol: 52 mg/dL (ref 0–99)
Total CHOL/HDL Ratio: 2.6 RATIO
Triglycerides: 81 mg/dL (ref <150)
VLDL: 16 mg/dL (ref 0–40)

## 2018-05-19 LAB — HEMOGLOBIN A1C
Hgb A1c MFr Bld: 4.8 % (ref 4.8–5.6)
Mean Plasma Glucose: 91.06 mg/dL

## 2018-05-19 LAB — TSH: TSH: 3.401 u[IU]/mL (ref 0.350–4.500)

## 2018-05-19 MED ORDER — DIPHENHYDRAMINE HCL 25 MG PO TABS
50.0000 mg | ORAL_TABLET | Freq: Four times a day (QID) | ORAL | Status: DC | PRN
  Administered 2018-05-21: 50 mg via ORAL
  Filled 2018-05-19: qty 20
  Filled 2018-05-19: qty 2
  Filled 2018-05-19: qty 20

## 2018-05-19 MED ORDER — HALOPERIDOL 5 MG PO TABS
5.0000 mg | ORAL_TABLET | Freq: Two times a day (BID) | ORAL | Status: DC
  Administered 2018-05-19 – 2018-05-29 (×19): 5 mg via ORAL
  Filled 2018-05-19 (×22): qty 1

## 2018-05-19 MED ORDER — HALOPERIDOL LACTATE 5 MG/ML IJ SOLN
10.0000 mg | Freq: Two times a day (BID) | INTRAMUSCULAR | Status: DC
  Filled 2018-05-19 (×24): qty 2

## 2018-05-19 MED ORDER — LORAZEPAM 1 MG PO TABS
1.0000 mg | ORAL_TABLET | ORAL | Status: AC
  Administered 2018-05-19: 1 mg via ORAL
  Filled 2018-05-19: qty 1

## 2018-05-19 MED ORDER — HALOPERIDOL 5 MG PO TABS
5.0000 mg | ORAL_TABLET | Freq: Two times a day (BID) | ORAL | Status: DC
  Filled 2018-05-19 (×5): qty 1

## 2018-05-19 NOTE — [facility] Group Notes (Signed)
[facility]  Date/Time:  05/19/2018  11:00AM-12:00PM  Type of Therapy and Topic:  Group Therapy:  Music and Mood  Participation Level:  Active   Description of Group: In this process group, members listened to a variety of genres of music and identified that different types of music evoke different responses.  Patients were encouraged to identify music that was soothing for them and music that was energizing for them.  Patients discussed how this knowledge can help with wellness and recovery in various ways including managing depression and anxiety as well as encouraging healthy sleep habits.    Therapeutic Goals: 1. Patients will explore the impact of different varieties of music on mood 2. Patients will verbalize the thoughts they have when listening to different types of music 3. Patients will identify music that is soothing to them as well as music that is energizing to them 4. Patients will discuss how to use this knowledge to assist in maintaining wellness and recovery 5. Patients will explore the use of music as a coping skill  Summary of Patient Progress:  At the beginning of group, patient expressed that she felt "awful" and started a lengthy story about why this was.  She was very resistant to CSW stopping her and telling her this was not the specific time for that.  She became angry when told she could not select a song to listen to, and would not listen to the reason why.  However, she immediately started singing and dancing for the first few songs, smiled and acted happy.  She talked throughout the music, regardless.  She then started insisting on a specific song being played, and when CSW expressed concerns for the appropriateness, angrily said "look at the lyrics."  After actually doing so, CSW continued to refuse to play this song because the lyrics were not appropriate to the group, and she insisted they were about something entirely different.  She was in and out  of the room, was loud and disruptive even while music was playing.  She was quite angry and delusional throughout the entire group, and this made it difficult for other members to participate.  Therapeutic Modalities: Solution Focused Brief Therapy Activity   Ambrose MantleMareida Grossman-Orr, LCSW

## 2018-05-19 NOTE — Progress Notes (Signed)
[facility] Second Physician Opinion Progress Note for Medication Administration to Non-consenting Patients (For Involuntarily Committed Patients)  Patient: Amy [name] Date of Birth: [birth year] MRN: [id number]  Reason for the Medication: The patient, without the benefit of the specific treatment measure, is incapable of participating in any available treatment plan that will give the patient a realistic opportunity of improving the patient's condition. There is, without the benefit of the specific treatment measure, a significant possibility that the patient will harm self or others before improvement of the patient's condition is realized.  Consideration of Side Effects: Consideration of the side effects related to the medication plan has been given.  Rationale for Medication Administration: Patient was seen and evaluated for second opinion for medication administration due to her refusal to receive medication treatment.  During the evaluation patient appeared to have labile affect, irritable, intermittent psychomotor agitation, thought process characterized by loose and clang association, grandiose and bizarre delusions(niece thoughts [place] President and assistant of [place], driving to [place] after meeting her [place] who lives [place] on fifth day after last Saturday of August, spatial to being buried by her called "[employer]" to go to [place]), bizarre behavior(mimiking voice of alliens she is connecting to and when Clinical research associatewriter moved a piece of paper to sit and she rearranged the paper which had a photo of wedding ring and reported that she is keeping the paper because it has ring in it for her marriage in [place]).   Given above-mentioned manic and psychotic symptoms, poor reality testing patient appears to be in acute danger to self or others, and not able to take care of self and therefore will benefit from psychiatric medications recommended by primary team and agrees with medication  management plan recommended by primary team.   Darcel SmallingHiren M Anarie Kalish, MD 05/19/18  2:21 PM   This documentation is good for (7) seven days from the date of the MD signature. New documentation must be completed every seven (7) days with detailed justification in the medical record if the patient requires continued non-emergent administration of psychotropic medications.

## 2018-05-19 NOTE — Plan of Care (Signed)
  Problem: Activity: Goal: Interest or engagement in activities will improve Outcome: Progressing   Problem: Coping: Goal: Ability to verbalize frustrations and anger appropriately will improve Outcome: Not Progressing   D: Pt alert and oriented on the unit. Pt engaging with RN staff and other pts. Pt denies SI/HI, A/VH, but has been preoccupied with internal stimuli. Pt was hyperactive during the day and talked about aliens and spaceships and how she had alien babies and everyone should respect aliens. Pt was also refusing her medications today. Pt denied any pain. Pt also participated during unit group and was disruptive and argumentative during group. A: Education, support and encouragement provided, q15 minute safety checks remain in effect. Medications administered per MD orders. R: No reactions/side effects to medicine noted. Pt denies any concerns at this time, and verbally contracts for safety. Pt ambulating on the unit with no issues. Pt remains safe on and off the unit.

## 2018-05-19 NOTE — Progress Notes (Addendum)
[facility] MD Progress Note  05/19/2018 12:42 PM Amy [name]  MRN:  [id number] Subjective:  Objective: 22 year old female presented to ED with disorganized behavior, disorganized thought process, and bizarre ideations.  During the evaluation patient remains difficult to assess due to significant thought disorder, pressured speech, delusional ideations, and bizarre statements.  Patient was seen and chart reviewed.   During today's assessment, pt reported poor sleep and poor appetite.As noted there is much difficulty assessing this patient due to current psychotic state she remains a level 5 caveat. She has been very disruptive in the milieu. She continues to ruminate and make inappropriate comments and speech. At times she presents with word salad, and speech is incomprehensible. She denies any depression, anxiety, agitation or psychosis. She is unable to recall the events that led to her admission as she continues to have ongoing disorganized thought processes. She is noncompliant with medication and having delusions that someone is trying to poison her and she states she is allergic to Haldol.   Currently depression at 7/10 but anxiety at 10/10. Pt states that nothing has helped with her anxiety or depression since she has been here. Pt denies SI, HI, and AVH, but states that she has not improved in the last 24 hours. Does contract for safety.    Principal Problem: <principal problem not specified> Diagnosis: Active Problems:   Schizoaffective psychosis (HCC)  Total Time spent with patient: 30 minutes  Past Psychiatric History: Substance Abuse History in the last 12 months:  Reports Cannabis Abuse. History of Methamphetamine Abuse, but denies recent use. Denies alcohol , BZD, or Opiate Abuse  Consequences of Substance Abuse: Psychosis Previous Psychotropic Medications: patient reports she has been on Lithium in the past . States she has not been taking medications recently. Chart notes , as above,  indicate she has recently been prescribed Abilify and that she was started on Abilify Maintena, with her next dose scheduled for 06/06/18.  Inpatient: Per chart review patient has 7 psychiatric inpatient admissions since March 2019.  She also has multiple ED visits. Outpatient: Currently under [facility]  Past Medical History:  Past Medical History:  Diagnosis Date  . Cannabis abuse   . Depression   . 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 Schizo affective schizophrenia (HCC)    History reviewed. No pertinent surgical history. Family History:  Family History  Problem Relation Age of Onset  . Mental illness Neg Hx    Family Psychiatric  History: none endorsed  Social History:  Social History   Substance and Sexual Activity  Alcohol Use Not Currently     Social History   Substance and Sexual Activity  Drug Use Yes  . Frequency: 7.0 times per week  . Types: Marijuana   Comment: +THC    Social History   Socioeconomic History  . Marital status: Single    Spouse name: Not on file  . Number of children: Not on file  . Years of education: 42  . Highest education level: 12th grade  Occupational History  . Occupation: Unemployed  Social Needs  . Financial resource strain: Not on file  . Food insecurity:    Worry: Not on file    Inability: Not on file  . Transportation needs:    Medical: Not on file    Non-medical: Not on file  Tobacco Use  . Smoking status: Current Every Day Smoker    Packs/day: 0.50    Types: Cigarettes  . Smokeless tobacco: Never  Used  Substance and Sexual Activity  . Alcohol use: Not Currently  . Drug use: Yes    Frequency: 7.0 times per week    Types: Marijuana    Comment: +THC  . Sexual activity: Not Currently    Birth control/protection: None  Lifestyle  . Physical activity:    Days per week: Not on file    Minutes per session: Not on file  . Stress: Not on file  Relationships  . Social connections:    Talks on  phone: Not on file    Gets together: Not on file    Attends religious service: Not on file    Active member of club or organization: Not on file    Attends meetings of clubs or organizations: Not on file    Relationship status: Not on file  Other Topics Concern  . Not on file  Social History Narrative   Pt is unemployed.  She stated that she [place] in [place].  Due to altered mental status, difficult to obtain history.  Per previous history, Pt lives with her parents.   Additional Social History:    Sleep: Fair  Appetite:  Poor  Current Medications: Current Facility-Administered Medications  Medication Dose Route Frequency Provider Last Rate Last Dose  . acetaminophen (TYLENOL) tablet 650 mg  650 mg Oral Q6H PRN Starkes-Perry, Juel Burrowakia S, FNP      . diphenhydrAMINE (BENADRYL) capsule 50 mg  50 mg Oral Q6H PRN Starkes-Perry, Juel Burrowakia S, FNP      . haloperidol (HALDOL) tablet 5 mg  5 mg Oral BID Maryagnes AmosStarkes-Perry, Takia S, FNP       Or  . haloperidol lactate (HALDOL) injection 10 mg  10 mg Intramuscular BID Starkes-Perry, Takia S, FNP      . lithium carbonate (LITHOBID) CR tablet 300 mg  300 mg Oral Q12H Maryagnes AmosStarkes-Perry, Takia S, FNP   300 mg at 05/19/18 0757  . nicotine polacrilex (NICORETTE) gum 2 mg  2 mg Oral PRN Janequa Kipnis, Rockey SituFernando A, MD   2 mg at 05/18/18 1638  . OLANZapine zydis (ZYPREXA) disintegrating tablet 5 mg  5 mg Oral Q8H PRN Zahari Fazzino, Rockey SituFernando A, MD   5 mg at 05/19/18 0854   And  . ziprasidone (GEODON) injection 20 mg  20 mg Intramuscular PRN Lizza Huffaker, Rockey SituFernando A, MD      . traZODone (DESYREL) tablet 100 mg  100 mg Oral QHS Nira ConnBerry, Jason A, NP   100 mg at 05/18/18 2038    Lab Results:  Results for orders placed or performed during the hospital encounter of 05/18/18 (from the past 48 hour(s))  Lipid panel     Status: None   Collection Time: 05/19/18  6:53 AM  Result Value Ref Range   Cholesterol 110 0 - 200 mg/dL   Triglycerides 81 <161<150 mg/dL   HDL 42 >09>40 mg/dL   Total CHOL/HDL  Ratio 2.6 RATIO   VLDL 16 0 - 40 mg/dL   LDL Cholesterol 52 0 - 99 mg/dL    Comment:        Total Cholesterol/HDL:CHD Risk Coronary Heart Disease Risk Table                     Men   Women  1/2 Average Risk   3.4   3.3  Average Risk       5.0   4.4  2 X Average Risk   9.6   7.1  3 X Average Risk  23.4  11.0        Use the calculated Patient Ratio above and the CHD Risk Table to determine the patient's CHD Risk.        ATP III CLASSIFICATION (LDL):  <100     mg/dL   Optimal  [id number]  mg/dL   Near or Above                    Optimal  130-159  mg/dL   Borderline  [id number]  mg/dL   High  >562>190     mg/dL   Very High Performed at [facility], [address] [place], [place] [address]   Hemoglobin A1c     Status: None   Collection Time: 05/19/18  6:53 AM  Result Value Ref Range   Hgb A1c MFr Bld 4.8 4.8 - 5.6 %    Comment: (NOTE) Pre diabetes:          5.7%-6.4% Diabetes:              >6.4% Glycemic control for   <7.0% adults with diabetes    Mean Plasma Glucose 91.06 mg/dL    Comment: Performed at [facility], [address] [place], [place] [address]  TSH     Status: None   Collection Time: 05/19/18  6:53 AM  Result Value Ref Range   TSH 3.401 0.350 - 4.500 uIU/mL    Comment: Performed by a 3rd Generation assay with a functional sensitivity of <=0.01 uIU/mL. Performed at [facility], [address] [place], [place] [address]     Blood Alcohol level:  Lab Results  Component Value Date   ETH <10 05/17/2018   ETH <10 05/16/2018    Metabolic Disorder Labs: Lab Results  Component Value Date   HGBA1C 4.8 05/19/2018   MPG 91.06 05/19/2018   MPG 91 10/25/2016   Lab Results  Component Value Date   PROLACTIN 33.3 (H) 10/25/2016   PROLACTIN 66.9 (H) 12/03/2015   Lab Results  Component Value Date   CHOL 110 05/19/2018   TRIG 81 05/19/2018   HDL 42 05/19/2018   CHOLHDL 2.6 05/19/2018   VLDL 16 05/19/2018   LDLCALC  52 05/19/2018   LDLCALC 61 10/25/2016    Physical Findings: AIMS: Facial and Oral Movements Muscles of Facial Expression: None, normal Lips and Perioral Area: None, normal Jaw: None, normal Tongue: None, normal,Extremity Movements Upper (arms, wrists, hands, fingers): None, normal Lower (legs, knees, ankles, toes): None, normal, Trunk Movements Neck, shoulders, hips: None, normal, Overall Severity Severity of abnormal movements (highest score from questions above): None, normal Incapacitation due to abnormal movements: None, normal Patient's awareness of abnormal movements (rate only patient's report): No Awareness, Dental Status Current problems with teeth and/or dentures?: No Does patient usually wear dentures?: No  CIWA:    COWS:     Musculoskeletal: Strength & Muscle Tone: within normal limits Gait & Station: normal Patient leans: [name]  Psychiatric Specialty Exam: Physical Exam  ROS  Blood pressure (!) 115/49, pulse (!) 102, temperature (!) 97.5 F (36.4 C), temperature source Oral, resp. rate 18, height 6\' 1"  (1.854 m), weight 87.1 kg, SpO2 100 %.Body mass index is 25.33 kg/m.  General Appearance: Bizarre and Fairly Groomed  Eye Contact:  Fair  Speech:  Pressured and rapid  Volume:  Increased  Mood:  Euphoric  Affect:  Inappropriate, Labile and Full Range  Thought Process:  Disorganized, Irrelevant and Descriptions of Associations: Tangential  Orientation:  Other:  oriented to name and place  Thought Content:  Delusions, Ideas of Reference:   Paranoia Delusions, Paranoid Ideation, Rumination and Tangential  Suicidal Thoughts:  No  Homicidal Thoughts:  No  Memory:  Immediate;   Fair Recent;   Fair  Judgement:  Impaired  Insight:  Lacking  Psychomotor Activity:  Increased  Concentration:  Concentration: Poor and Attention Span: Poor  Recall:  Poor  Fund of Knowledge:  Poor  Language:  Poor  Akathisia:  No  Handed:  Right  AIMS (if indicated):     Assets:   Architect Housing Leisure Time Physical Health Social Support  ADL's:  Intact  Cognition:  WNL  Sleep:  Number of Hours: 5.75     Treatment Plan Summary: Daily contact with patient to assess and evaluate symptoms and progress in treatment and Medication management   Treatment Plan/Recommendations:  1 Admit for crisis management and stabilization. Estimated length of stay 5-7 days past his current stay of 1.  2 Individual and group therapy. 3 Medication management for depression, and anxiety to reduce current symptoms to base line and improve the overall levels of functioning: Medications reviewed with the patient and she stated no untoward effects, home medications in place.   Upon further chart review it was determined that patient responds well to Haldol, when in acute psychotic state and manic. We have also resumed lithium 300mg  po BID, her lithium level on admission was 0.06. Will need lithium level in 4-5 days.  Due to severe agitation and psychosis she has been placed on  Agitation protocol ( Zyprexa, Ativan, Geodon IM).  EKG obtained on admission showed QTC 410.  She is also taking Abilify maintenna date of last injection unknown at this time however Act team staff reports were within the last week. (patient reports shoulder soreness as well). Patient with worsening agitation and mood lability, may require force med order, will consult with 2nd physician.   4 Coping skills for depression and anxiety developing.  5 Continue crisis stabilization and management.  6 Address health issues- monitor vital signs, stable. 7 Treatment plan in progress to prevent relapse prevention and self care.  8 Psychosocial education regarding relapse prevention and self care 9 Heath care follow up as needed for any health concerns 10 Call for consult with hospitalist for additional specialty patient services as needed.  Maryagnes Amos,  FNP 05/19/2018, 12:42 PM    ..Agree with NP Progress Note

## 2018-05-19 NOTE — [facility] Counselor (Signed)
Clinical Social Work Note  It is not possible to attempt to do Psychosocial Assessment with pt today due to her disorganization and paranoia.  To be attempted again in the next 24-48 hours by CSW staff.  Ambrose MantleMareida Grossman-Orr, LCSW 05/19/2018, 2:02 PM

## 2018-05-20 MED ORDER — OLANZAPINE 10 MG PO TBDP
10.0000 mg | ORAL_TABLET | Freq: Two times a day (BID) | ORAL | Status: DC
  Administered 2018-05-20 – 2018-05-21 (×3): 10 mg via SUBLINGUAL
  Filled 2018-05-20 (×6): qty 1

## 2018-05-20 MED ORDER — LORAZEPAM 1 MG PO TABS
2.0000 mg | ORAL_TABLET | Freq: Once | ORAL | Status: AC
  Administered 2018-05-20: 2 mg via ORAL
  Filled 2018-05-20: qty 2

## 2018-05-20 NOTE — Progress Notes (Signed)
CSW attempted to meet with patient to complete assessment. Patient initially agreeable but shortly after CSW introduced self patient closed her eyes and began to make exaggerated snoring noises and twitched her feet. CSW stated she would follow up with patient later.  Enid Cutterharlotte Jahleel Stroschein, LCSW-A Clinical Social Worker

## 2018-05-20 NOTE — Progress Notes (Signed)
Recreation Therapy Notes  Date: 12.30.19 Time: 1015 Location: [address] Dayroom  Group Topic: Anger Management  Goal Area(s) Addresses:  Pt will identify things that cause them to get angry. Pt will identify their reaction to anger. Pt will identify things their anger has cost them. Pt will identify coping skills to dealing with anger.  Intervention: Worksheet  Activity: Introduction to Anger Management.  Patients were to identify the things that cause their anger, their reactions to anger, the things their anger has cost them and positive coping skills they can use when angry.  Education: Coping Skills, Discharge Planning.   Education Outcome: Acknowledges understanding/In group clarification offered/Needs additional education.   Clinical Observations/Feedback: Pt did not attend group.    Nasim Garofano, LRT/CTRS         Madason Rauls A 05/20/2018 11:20 AM 

## 2018-05-20 NOTE — Progress Notes (Addendum)
[facility] MD Progress Note  05/20/2018 10:45 AM Amy [name]  MRN:  [id number] Subjective:    Patient well-known to the examiner she has had numerous admissions usually driven by substance abuse and his substance intoxication involving history of cocaine but more predominantly cannabis, these drugs of abuse lead to a schizoaffective type psychosis, so far this morning she is been labile irritable refusing meds screaming at the staff been discussing irrelevant issues with her mother on the phone, requires a good deal of redirection. Again this is a pretty typical presentation for her but she agrees to take medication and is specifically requesting sublingual Zyprexa  Principal Problem: Schizoaffective psychosis (HCC) Diagnosis: Principal Problem:   Schizoaffective psychosis (HCC)  Total Time spent with patient: 20 minutes Past Medical History:  Past Medical History:  Diagnosis Date  . Cannabis abuse   . Depression   . 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 Schizo affective schizophrenia (HCC)    History reviewed. No pertinent surgical history. Family History:  Family History  Problem Relation Age of Onset  . Mental illness Neg Hx    Social History:  Social History   Substance and Sexual Activity  Alcohol Use Not Currently     Social History   Substance and Sexual Activity  Drug Use Yes  . Frequency: 7.0 times per week  . Types: Marijuana   Comment: +THC    Social History   Socioeconomic History  . Marital status: Single    Spouse name: Not on file  . Number of children: Not on file  . Years of education: 94  . Highest education level: 12th grade  Occupational History  . Occupation: Unemployed  Social Needs  . Financial resource strain: Not on file  . Food insecurity:    Worry: Not on file    Inability: Not on file  . Transportation needs:    Medical: Not on file    Non-medical: Not on file  Tobacco Use  . Smoking status: Current Every Day Smoker    Packs/day: 0.50     Types: Cigarettes  . Smokeless tobacco: Never Used  Substance and Sexual Activity  . Alcohol use: Not Currently  . Drug use: Yes    Frequency: 7.0 times per week    Types: Marijuana    Comment: +THC  . Sexual activity: Not Currently    Birth control/protection: None  Lifestyle  . Physical activity:    Days per week: Not on file    Minutes per session: Not on file  . Stress: Not on file  Relationships  . Social connections:    Talks on phone: Not on file    Gets together: Not on file    Attends religious service: Not on file    Active member of club or organization: Not on file    Attends meetings of clubs or organizations: Not on file    Relationship status: Not on file  Other Topics Concern  . Not on file  Social History Narrative   Pt is unemployed.  She stated that she lives in [place].  Due to altered mental status, difficult to obtain history.  Per previous history, Pt lives with her parents.   Additional Social History:                         Sleep: Good  Appetite:  Fair  Current Medications: Current Facility-Administered Medications  Medication Dose Route Frequency Provider Last Rate Last Dose  .  acetaminophen (TYLENOL) tablet 650 mg  650 mg Oral Q6H PRN Starkes-Perry, Juel Burrowakia S, FNP      . diphenhydrAMINE (BENADRYL) capsule 50 mg  50 mg Oral Q6H PRN Starkes-Perry, Juel Burrowakia S, FNP      . haloperidol (HALDOL) tablet 5 mg  5 mg Oral BID Maryagnes AmosStarkes-Perry, Takia S, FNP   5 mg at 05/20/18 [id number]   Or  . haloperidol lactate (HALDOL) injection 10 mg  10 mg Intramuscular BID Maryagnes AmosStarkes-Perry, Takia S, FNP      . lithium carbonate (LITHOBID) CR tablet 300 mg  300 mg Oral Q12H Maryagnes AmosStarkes-Perry, Takia S, FNP   300 mg at 05/20/18 [id number]  . LORazepam (ATIVAN) tablet 2 mg  2 mg Oral Once Malvin JohnsFarah, Susannah Carbin, MD      . nicotine polacrilex (NICORETTE) gum 2 mg  2 mg Oral PRN Cobos, Rockey SituFernando A, MD   2 mg at 05/18/18 1638  . OLANZapine zydis (ZYPREXA) disintegrating tablet 10 mg  10  mg Sublingual BID Malvin JohnsFarah, Akanksha Bellmore, MD      . OLANZapine zydis [facility](ZYPREXA) disintegrating tablet 5 mg  5 mg Oral Q8H PRN Cobos, Rockey SituFernando A, MD   5 mg at 05/20/18 [id number]   And  . ziprasidone (GEODON) injection 20 mg  20 mg Intramuscular PRN Cobos, Rockey SituFernando A, MD      . traZODone (DESYREL) tablet 100 mg  100 mg Oral QHS Nira ConnBerry, Jason A, NP   100 mg at 05/18/18 2038    Lab Results:  Results for orders placed or performed during the hospital encounter of 05/18/18 (from the past 48 hour(s))  Lipid panel     Status: None   Collection Time: 05/19/18  6:53 AM  Result Value Ref Range   Cholesterol 110 0 - 200 mg/dL   Triglycerides 81 <332<150 mg/dL   HDL 42 >95>40 mg/dL   Total CHOL/HDL Ratio 2.6 RATIO   VLDL 16 0 - 40 mg/dL   LDL Cholesterol 52 0 - 99 mg/dL    Comment:        Total Cholesterol/HDL:CHD Risk Coronary Heart Disease Risk Table                     Men   Women  1/2 Average Risk   3.4   3.3  Average Risk       5.0   4.4  2 X Average Risk   9.6   7.1  3 X Average Risk  23.4   11.0        Use the calculated Patient Ratio above and the CHD Risk Table to determine the patient's CHD Risk.        ATP III CLASSIFICATION (LDL):  <100     mg/dL   Optimal  [id number]  mg/dL   Near or Above                    Optimal  130-159  mg/dL   Borderline  [id number]  mg/dL   High  >601>190     mg/dL   Very High Performed at [facility], [address] [place], [place] [address]   Hemoglobin A1c     Status: None   Collection Time: 05/19/18  6:53 AM  Result Value Ref Range   Hgb A1c MFr Bld 4.8 4.8 - 5.6 %    Comment: (NOTE) Pre diabetes:          5.7%-6.4% Diabetes:              >  6.4% Glycemic control for   <7.0% adults with diabetes    Mean Plasma Glucose 91.06 mg/dL    Comment: Performed at [facility], [address] [place], [place] [address]  TSH     Status: None   Collection Time: 05/19/18  6:53 AM  Result Value Ref Range   TSH 3.401 0.350 - 4.500 uIU/mL    Comment:  Performed by a 3rd Generation assay with a functional sensitivity of <=0.01 uIU/mL. Performed at [facility], [address] [place], [place] [address]     Blood Alcohol level:  Lab Results  Component Value Date   ETH <10 05/17/2018   ETH <10 05/16/2018    Metabolic Disorder Labs: Lab Results  Component Value Date   HGBA1C 4.8 05/19/2018   MPG 91.06 05/19/2018   MPG 91 10/25/2016   Lab Results  Component Value Date   PROLACTIN 33.3 (H) 10/25/2016   PROLACTIN 66.9 (H) 12/03/2015   Lab Results  Component Value Date   CHOL 110 05/19/2018   TRIG 81 05/19/2018   HDL 42 05/19/2018   CHOLHDL 2.6 05/19/2018   VLDL 16 05/19/2018   LDLCALC 52 05/19/2018   LDLCALC 61 10/25/2016    Physical Findings: AIMS: Facial and Oral Movements Muscles of Facial Expression: None, normal Lips and Perioral Area: None, normal Jaw: None, normal Tongue: None, normal,Extremity Movements Upper (arms, wrists, hands, fingers): None, normal Lower (legs, knees, ankles, toes): None, normal, Trunk Movements Neck, shoulders, hips: None, normal, Overall Severity Severity of abnormal movements (highest score from questions above): None, normal Incapacitation due to abnormal movements: None, normal Patient's awareness of abnormal movements (rate only patient's report): No Awareness, Dental Status Current problems with teeth and/or dentures?: No Does patient usually wear dentures?: No  CIWA:    COWS:     Musculoskeletal: Strength & Muscle Tone: within normal limits Gait & Station: normal Patient leans: [name]  Psychiatric Specialty Exam: Physical Exam  ROS  Blood pressure 120/75, pulse 81, temperature 98.8 F (37.1 C), temperature source Oral, resp. rate 18, height 6\' 1"  (1.854 m), weight 87.1 kg, SpO2 100 %.Body mass index is 25.33 kg/m.  General Appearance: Disheveled  Eye Contact:  Fair  Speech:  Pressured  Volume:  Increased  Mood:  Anxious and Irritable  Affect:   Congruent  Thought Process:  Disorganized  Orientation:  Full (Time, Place, and Person)  Thought Content:  Illogical and Tangential  Suicidal Thoughts:  No  Homicidal Thoughts:  No  Memory:  Immediate;   Good  Judgement:  Fair  Insight:  Fair  Psychomotor Activity:  Increased and Restlessness  Concentration:  Concentration: Fair  Recall:  Fair  Fund of Knowledge:  Fair  Language:  Negative for issues  Akathisia:  Negative  Handed:  Right  AIMS (if indicated):     Assets:  Physical Health  ADL's:  Intact  Cognition:  WNL  Sleep:  Number of Hours: 4.25     Treatment Plan Summary: Daily contact with patient to assess and evaluate symptoms and progress in treatment, Medication management and Plan We will go ahead and order the Zyprexa Zydis for her schizoaffective condition, add Ativan x1 because of agitation, hopefully she will level out And recalibrate to her baseline soon, continue every 15 minute precautions and reality based therapies Amy Rockers, MD 05/20/2018, 10:45 AM

## 2018-05-20 NOTE — BHH Group Notes (Signed)
LCSW Group Therapy Notes    Type of Therapy and Topic: Group Therapy: Effective Communication - Criticism    Participation Level: None   Description of Group:  In this group patients will be asked to identify their own styles of communication as well as defining and identifying passive, assertive, and aggressive styles of communication. Participants will identify strategies to communicate in a more assertive manner in an effort to appropriately meet their needs. This group will be process-oriented, with patients participating in exploration of their own experiences as well as giving and receiving support and challenge from other group members.   Therapeutic Goals: 1. Patient will identify their personal communication style. 2. Patient will identify passive, assertive, and aggressive forms of communication. 3. Patient will identify strategies for developing more effective communication to appropriately meet their needs.    Summary of Patient Progress Did not attend.    Therapeutic Modalities:  Communication Skills Solution Focused Therapy Motivational Interviewing     Giankarlo Leamer, MSW, LCSWA 

## 2018-05-20 NOTE — Plan of Care (Signed)
  Problem: Education: Goal: Mental status will improve Outcome: Not Progressing   Problem: Coping: Goal: Ability to verbalize frustrations and anger appropriately will improve Outcome: Not Progressing   

## 2018-05-20 NOTE — Progress Notes (Signed)
D:  Amy [name] has been up and visible on the unit.  She denied SI/HI or A/V hallucinations.  She repeatedly asked for snacks or a meal trays.  She was given multiple snacks but when limits were set, she would sit in the day room or her room crying loudly.  She would come back later and ask other staff to give her more snacks.  She became argumentative, hostile, delusional about having to call Trump to complain about being here.  She was hyper-religious and would quote Bible verses about food.  Multiple prn's were given with minimal relief.  She did take hs medications without difficulty.  She is currently resting with her eyes closed and appears to be asleep. A:  1:1 with RN for support and encouragement.  Medications as ordered.  Q 15 minute checks maintained for safety.  Encouraged participation in group and unit activities.   R:  Amy [name] remains safe on the unit.  We will continue to monitor the progress towards her goals.

## 2018-05-20 NOTE — Progress Notes (Signed)
Recreation Therapy Notes  INPATIENT RECREATION THERAPY ASSESSMENT  Patient Details Name: Amy [name] MRN: [id number] DOB: Sep 18, 1995 Today's Date: 05/20/2018       Information Obtained From: Patient  Able to Participate in Assessment/Interview: Yes  Patient Presentation: Alert  Reason for Admission (Per Patient): Other (Comments)(Paranoia, people thinking she has diseases)  Patient Stressors: Other (Comment)(Coping skills; People)  Coping Skills:   Film/video editorsolation, Self-Injury, Journal, Arguments, Music, Exercise, Meditate, Deep Breathing, Substance Abuse, Prayer, Dance, Hot Bath/Shower  Leisure Interests (2+):  Social - Family, Social - Friends, Individual - Other (Comment)(Cooking)  Frequency of Recreation/Participation: Monthly  Awareness of Community Resources:  Yes  Community Resources:  Research scientist (physical sciences)Movie Theaters, Public affairs consultantestaurants  Current Use: Yes  If no, Barriers?:    Expressed Interest in [employer] Resource Information: No  [place] of [place]idence[place]  [place]  Patient Main Form of Transportation: Other (Comment)(Mom)  Patient Strengths:  Positive thinking; being able to move around; tell people their truths  Patient Identified Areas of Improvement:  Coping skills; Paranoia  Patient Goal for Hospitalization:  "Get help with paranoia, aliens and 4th baby Coi".  Current SI (including self-harm):  No  Current HI:  No  Current AVH: No  Staff Intervention Plan: Group Attendance, Collaborate with Interdisciplinary Treatment Team  Consent to Intern Participation: [name]   Caroll RancherMarjette Panda Crossin, LRT/CTRS   Caroll RancherLindsay, Raheem Kolbe A 05/20/2018, 2:30 PM

## 2018-05-20 NOTE — Tx Team (Signed)
Interdisciplinary Treatment and Diagnostic Plan Update  05/20/2018 Time of Session: 9:00am SANJA ELIZARDO MRN: [id number]  Principal Diagnosis: Schizoaffective psychosis ([place])  Secondary Diagnoses: Principal Problem:   Schizoaffective psychosis ([place])   Current Medications:  Current Facility-Administered Medications  Medication Dose Route Frequency Provider Last Rate Last Dose  . acetaminophen (TYLENOL) tablet 650 mg  650 mg Oral Q6H PRN Starkes-Perry, Gayland Curry, FNP      . diphenhydrAMINE (BENADRYL) capsule 50 mg  50 mg Oral Q6H PRN Starkes-Perry, Gayland Curry, FNP      . haloperidol (HALDOL) tablet 5 mg  5 mg Oral BID Suella Broad, FNP   5 mg at 05/20/18 1062   Or  . haloperidol lactate (HALDOL) injection 10 mg  10 mg Intramuscular BID Suella Broad, FNP      . lithium carbonate (LITHOBID) CR tablet 300 mg  300 mg Oral Q12H Suella Broad, FNP   300 mg at 05/20/18 6948  . nicotine polacrilex (NICORETTE) gum 2 mg  2 mg Oral PRN Cobos, Myer Peer, MD   2 mg at 05/18/18 1638  . OLANZapine zydis (ZYPREXA) disintegrating tablet 10 mg  10 mg Sublingual BID Johnn Hai, MD   10 mg at 05/20/18 1107  . OLANZapine zydis (ZYPREXA) disintegrating tablet 5 mg  5 mg Oral Q8H PRN Cobos, Myer Peer, MD   5 mg at 05/20/18 5462   And  . ziprasidone (GEODON) injection 20 mg  20 mg Intramuscular PRN Cobos, Myer Peer, MD      . traZODone (DESYREL) tablet 100 mg  100 mg Oral QHS Lindon Romp A, NP   100 mg at 05/18/18 2038   PTA Medications: Medications Prior to Admission  Medication Sig Dispense Refill Last Dose  . ARIPiprazole ER 400 MG SRER Inject 400 mg into the muscle every 28 (twenty-eight) days. (Due 11-28-16): For mood control (Patient not taking: Reported on 10/27/2017) 1 each 0 Not Taking at Unknown time  . ibuprofen (ADVIL,MOTRIN) 800 MG tablet Take 1 tablet (800 mg total) by mouth 2 (two) times daily. With food (Patient not taking: Reported on 11/14/2017) 12 tablet 0  Completed Course at Unknown time  . lithium carbonate (LITHOBID) 300 MG CR tablet Take 1 tablet (300 mg total) by mouth every 12 (twelve) hours. (Patient not taking: Reported on 11/02/2017) 28 tablet 0 Not Taking at Unknown time  . traZODone (DESYREL) 100 MG tablet Take 1 tablet (100 mg total) by mouth at bedtime. (Patient not taking: Reported on 11/02/2017) 14 tablet 0 Not Taking at Unknown time    Patient Stressors: Marital or family conflict Medication change or noncompliance  Patient Strengths: Ability for insight Motivation for treatment/growth  Treatment Modalities: Medication Management, Group therapy, Case management,  1 to 1 session with clinician, Psychoeducation, Recreational therapy.   Physician Treatment Plan for Primary Diagnosis: Schizoaffective psychosis ([place]) Long Term Goal(s): Improvement in symptoms so as ready for discharge Improvement in symptoms so as ready for discharge   Short Term Goals: Ability to identify changes in lifestyle to reduce recurrence of condition will improve Ability to maintain clinical measurements within normal limits will improve Ability to identify changes in lifestyle to reduce recurrence of condition will improve Ability to verbalize feelings will improve Ability to disclose and discuss suicidal ideas Ability to demonstrate self-control will improve Ability to identify and develop effective coping behaviors will improve Ability to maintain clinical measurements within normal limits will improve  Medication Management: Evaluate patient's response, side effects, and tolerance of  medication regimen.  Therapeutic Interventions: 1 to 1 sessions, Unit Group sessions and Medication administration.  Evaluation of Outcomes: Not Met  Physician Treatment Plan for Secondary Diagnosis: Principal Problem:   Schizoaffective psychosis ([place])  Long Term Goal(s): Improvement in symptoms so as ready for discharge Improvement in symptoms so as ready for  discharge   Short Term Goals: Ability to identify changes in lifestyle to reduce recurrence of condition will improve Ability to maintain clinical measurements within normal limits will improve Ability to identify changes in lifestyle to reduce recurrence of condition will improve Ability to verbalize feelings will improve Ability to disclose and discuss suicidal ideas Ability to demonstrate self-control will improve Ability to identify and develop effective coping behaviors will improve Ability to maintain clinical measurements within normal limits will improve     Medication Management: Evaluate patient's response, side effects, and tolerance of medication regimen.  Therapeutic Interventions: 1 to 1 sessions, Unit Group sessions and Medication administration.  Evaluation of Outcomes: Not Met   RN Treatment Plan for Primary Diagnosis: Schizoaffective psychosis ([place]) Long Term Goal(s): Knowledge of disease and therapeutic regimen to maintain health will improve  Short Term Goals: Ability to remain free from injury will improve, Ability to verbalize frustration and anger appropriately will improve, Ability to demonstrate self-control, Ability to verbalize feelings will improve, Ability to disclose and discuss suicidal ideas and Compliance with prescribed medications will improve  Medication Management: RN will administer medications as ordered by provider, will assess and evaluate patient's response and provide education to patient for prescribed medication. RN will report any adverse and/or side effects to prescribing provider.  Therapeutic Interventions: 1 on 1 counseling sessions, Psychoeducation, Medication administration, Evaluate responses to treatment, Monitor vital signs and CBGs as ordered, Perform/monitor CIWA, COWS, AIMS and Fall Risk screenings as ordered, Perform wound care treatments as ordered.  Evaluation of Outcomes: Not Met   LCSW Treatment Plan for Primary Diagnosis:  Schizoaffective psychosis ([place]) Long Term Goal(s): Safe transition to appropriate next level of care at discharge, Engage patient in therapeutic group addressing interpersonal concerns.  Short Term Goals: Engage patient in aftercare planning with referrals and resources, Increase social support, Increase ability to appropriately verbalize feelings, Increase emotional regulation, Identify triggers associated with mental health/substance abuse issues and Increase skills for wellness and recovery  Therapeutic Interventions: Assess for all discharge needs, 1 to 1 time with Social worker, Explore available resources and support systems, Assess for adequacy in community support network, Educate family and significant other(s) on suicide prevention, Complete Psychosocial Assessment, Interpersonal group therapy.  Evaluation of Outcomes: Not Met   Progress in Treatment: Attending groups: No. Participating in groups: No. Taking medication as prescribed: Yes. Toleration medication: Yes. Family/Significant other contact made: No, will contact:  supports if consent is given Patient understands diagnosis: No. Discussing patient identified problems/goals with staff: No. Medical problems stabilized or resolved: No. Denies suicidal/homicidal ideation: No. Issues/concerns per patient self-inventory: No.  New problem(s) identified: No, Describe:  patient paranoid and disruptive on unit, was not able to be assessed over weekend  New Short Term/Long Term Goal(s): detox, medication management for mood stabilization; elimination of SI thoughts; development of comprehensive mental wellness/sobriety plan.  Patient Goals:  Unable to assess. Patient would not speak with CSW.  Discharge Plan or Barriers: Continuing to assess. [place] pamphlet, Mobile Crisis information, and AA/NA information provided to patient for additional community support and resources.   Reason for Continuation of Hospitalization:  Aggression Anxiety Delusions  Depression Hallucinations Medical Issues Suicidal ideation  Estimated Length of Stay: 3-5 days  Attendees: Patient: 05/20/2018 2:46 PM  Physician: Dr.Farah 05/20/2018 2:46 PM  Nursing: Nicoletta Dress RN 05/20/2018 2:46 PM  RN Care Manager: 05/20/2018 2:46 PM  Social Worker: Stephanie Acre, [place] 05/20/2018 2:46 PM  Recreational Therapist:  05/20/2018 2:46 PM  Other:  05/20/2018 2:46 PM  Other:  05/20/2018 2:46 PM  Other: 05/20/2018 2:46 PM    Scribe for Treatment Team: Joellen Jersey, [place] 05/20/2018 2:46 PM

## 2018-05-20 NOTE — Progress Notes (Signed)
DAR Note: Pt visible in hall on initial encounter. Denies SI, HI, AVH and pain when assessed. Presents suspicious and paranoid of others "y'all trying to hurt me in here". Pt has been agitated, irritable, restless with loud, pressured speech and continues to be verbally abusive to staff majority of this shift "what the fuck, why the hell y'all sweating me for, I want to get the fuck out of here, don't give me no fucking medications let Dr. Otelia SanteeFarrah give it to me". Pt remains delusional on interactions "[facility] because I'm going to get shot, I have papers and y'all still holding me here against my will" "I'm going to sue y'all and you will be fucking ashame". Took her medications with increased prompts and Denies side effects and physical discomfort at this time. Pt did not attend groups as she is very intrusive, restless / hyperactive and disruptive to stay in groups. Emotional support, encouragement and reassurance offered to pt throughout this shift. All medication given per MD's orders with verbal education and effects monitored. Safety checks maintained without self harm gestures or outburst thus far.  Pt asleep at this time. Mood remains labile, continues to be disruptive in milieu when awake. Tolerates all PO intake well. POC continues for safety and mood stability.

## 2018-05-21 MED ORDER — OLANZAPINE 5 MG PO TBDP
15.0000 mg | ORAL_TABLET | Freq: Two times a day (BID) | ORAL | Status: DC
  Administered 2018-05-21 – 2018-05-24 (×6): 15 mg via SUBLINGUAL
  Filled 2018-05-21 (×6): qty 3
  Filled 2018-05-21: qty 1
  Filled 2018-05-21 (×2): qty 3

## 2018-05-21 MED ORDER — LORAZEPAM 1 MG PO TABS
ORAL_TABLET | ORAL | Status: AC
  Filled 2018-05-21: qty 1

## 2018-05-21 MED ORDER — HYDROXYZINE HCL 50 MG PO TABS
50.0000 mg | ORAL_TABLET | ORAL | Status: DC | PRN
  Administered 2018-05-21 – 2018-05-27 (×3): 50 mg via ORAL
  Filled 2018-05-21 (×4): qty 1
  Filled 2018-05-21: qty 10

## 2018-05-21 MED ORDER — LORAZEPAM 1 MG PO TABS
1.0000 mg | ORAL_TABLET | Freq: Once | ORAL | Status: AC
  Administered 2018-05-21: 1 mg via ORAL
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
## 2018-05-21 NOTE — Plan of Care (Signed)
  Problem: Education: Goal: Emotional status will improve Outcome: Progressing   Problem: Activity: Goal: Sleeping patterns will improve Outcome: Progressing   

## 2018-05-21 NOTE — [facility] Group Notes (Signed)
LCSW Group Therapy Notes    Type of Therapy and Topic: Group Therapy: Effective Communication   Participation Level: Minimal   Description of Group:  In this group patients will be asked to identify their own styles of communication as well as defining and identifying passive, assertive, and aggressive styles of communication. Participants will identify strategies to communicate in a more assertive manner in an effort to appropriately meet their needs. This group will be process-oriented, with patients participating in exploration of their own experiences as well as giving and receiving support and challenge from other group members.   Therapeutic Goals: 1. Patient will identify their personal communication style. 2. Patient will identify passive, assertive, and aggressive forms of communication. 3. Patient will identify strategies for developing more effective communication to appropriately meet their needs.    Summary of Patient Progress Patient present for most of group, but sat in the back corner facing the wall. Patient angrily interjected a handful of times off topic, saying Jesus is ready for her and complaining about other patients on the unit.   Therapeutic Modalities:  Communication Skills Solution Focused Therapy Motivational Interviewing     Enid Cutterharlotte Zyona Pettaway, MSW, [address]CSWA

## 2018-05-21 NOTE — Plan of Care (Signed)
Progress note  D: pt found in the hallway; compliant with medication administration. Pt is still confused and delusional. Pt is paranoid with her family trying to get her into rehab. Pt has delusions of aliens and having 9 babies. Pt denies any si/hi/ah/vh and verbally agrees to approach staff if these become apparent or before harming herself or others [facility]. Pt has been pacing and intrusive with other patients. This pt and another pt on the hall got into a disagreement. Pt was able to be redirected. A: pt provided support and encouragement. Pt given medication per protocol and standing orders. [id number] safety checks implemented and continued.  R: pt safe on the unit. Will continue to monitor.   Pt progressing in the following metrics  Problem: Education: Goal: Knowledge of [facility] Education information/materials will improve Outcome: Progressing Goal: Verbalization of understanding the information provided will improve Outcome: Progressing   Problem: Coping: Goal: Ability to demonstrate self-control will improve Outcome: Progressing   Problem: Health Behavior/Discharge Planning: Goal: Compliance with treatment plan for underlying cause of condition will improve Outcome: Progressing

## 2018-05-21 NOTE — Progress Notes (Signed)
D:  Amy [name] was in the day room all evening.  She was guarded, minimal but not loud or intrusive this evening.  She denied SI/HI or A/V hallucinations.  She did request extra food along with her snack this evening and didn't request any more food/snacks after that.  She took her hs medications without difficulty. She is currently resting with her eyes closed and appeared to be asleep. A:  1:1 with RN for support and encouragement.  Medications as ordered.  Q 15 minute checks maintained for safety.  Encouraged participation in group and unit activities.   R:  Amy [name] remains safe on the unit.  We will continue to monitor the progress towards her goals.

## 2018-05-21 NOTE — Progress Notes (Signed)
[facility] MD Progress Note  05/21/2018 9:43 AM Amy [name]  MRN:  [id number] Subjective:    No longer paranoid and irritable like yesterday and is taking her meds is not refusing meds until I personally give them to her as she demanded yesterday however she is adamantly refusing rehab and this is making her irritable even discussing rehab.  Stating she will have a place to live even if her parents do not let her back in the home.  Stating she does not want to need rehab measures, weak Splane repeatedly that her psychiatric pathology is almost 100% driven by her substance abuse either cannabis or anything stronger the bottom line is she is refusing these rehab options now.  Principal Problem: Schizoaffective psychosis (HCC) Diagnosis: Principal Problem:   Schizoaffective psychosis (HCC)  Total Time spent with patient: 20 minutes  Past Medical History:  Past Medical History:  Diagnosis Date  . Cannabis abuse   . Depression   . 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 Schizo affective schizophrenia (HCC)    History reviewed. No pertinent surgical history. Family History:  Family History  Problem Relation Age of Onset  . Mental illness Neg Hx    Social History:  Social History   Substance and Sexual Activity  Alcohol Use Not Currently     Social History   Substance and Sexual Activity  Drug Use Yes  . Frequency: 7.0 times per week  . Types: Marijuana   Comment: +THC    Social History   Socioeconomic History  . Marital status: Single    Spouse name: Not on file  . Number of children: Not on file  . Years of education: 13  . Highest education level: 12th grade  Occupational History  . Occupation: Unemployed  Social Needs  . Financial resource strain: Not on file  . Food insecurity:    Worry: Not on file    Inability: Not on file  . Transportation needs:    Medical: Not on file    Non-medical: Not on file  Tobacco Use  . Smoking status: Current Every Day Smoker   Packs/day: 0.50    Types: Cigarettes  . Smokeless tobacco: Never Used  Substance and Sexual Activity  . Alcohol use: Not Currently  . Drug use: Yes    Frequency: 7.0 times per week    Types: Marijuana    Comment: +THC  . Sexual activity: Not Currently    Birth control/protection: None  Lifestyle  . Physical activity:    Days per week: Not on file    Minutes per session: Not on file  . Stress: Not on file  Relationships  . Social connections:    Talks on phone: Not on file    Gets together: Not on file    Attends religious service: Not on file    Active member of club or organization: Not on file    Attends meetings of clubs or organizations: Not on file    Relationship status: Not on file  Other Topics Concern  . Not on file  Social History Narrative   Pt is unemployed.  She stated that she lives in [place].  Due to altered mental status, difficult to obtain history.  Per previous history, Pt lives with her parents.   Additional Social History:                         Sleep: Fair  Appetite:  Fair  Current Medications:  Current Facility-Administered Medications  Medication Dose Route Frequency Provider Last Rate Last Dose  . acetaminophen (TYLENOL) tablet 650 mg  650 mg Oral Q6H PRN Starkes-Perry, Juel Burrowakia S, FNP      . diphenhydrAMINE (BENADRYL) capsule 50 mg  50 mg Oral Q6H PRN Maryagnes AmosStarkes-Perry, Takia S, FNP   50 mg at 05/21/18 0805  . haloperidol (HALDOL) tablet 5 mg  5 mg Oral BID Maryagnes AmosStarkes-Perry, Takia S, FNP   5 mg at 05/21/18 0805   Or  . haloperidol lactate (HALDOL) injection 10 mg  10 mg Intramuscular BID Maryagnes AmosStarkes-Perry, Takia S, FNP      . lithium carbonate (LITHOBID) CR tablet 300 mg  300 mg Oral Q12H Maryagnes AmosStarkes-Perry, Takia S, FNP   300 mg at 05/21/18 0805  . nicotine polacrilex (NICORETTE) gum 2 mg  2 mg Oral PRN Cobos, Rockey SituFernando A, MD   2 mg at 05/18/18 1638  . OLANZapine zydis (ZYPREXA) disintegrating tablet 15 mg  15 mg Sublingual BID Malvin JohnsFarah, Tayona Sarnowski, MD       . OLANZapine zydis [facility](ZYPREXA) disintegrating tablet 5 mg  5 mg Oral Q8H PRN Cobos, Rockey SituFernando A, MD   5 mg at 05/20/18 1717   And  . ziprasidone (GEODON) injection 20 mg  20 mg Intramuscular PRN Cobos, Rockey SituFernando A, MD      . traZODone (DESYREL) tablet 100 mg  100 mg Oral QHS Nira ConnBerry, Jason A, NP   100 mg at 05/18/18 2038    Lab Results: No results found for this or any previous visit (from the past 48 hour(s)).  Blood Alcohol level:  Lab Results  Component Value Date   ETH <10 05/17/2018   ETH <10 05/16/2018    Metabolic Disorder Labs: Lab Results  Component Value Date   HGBA1C 4.8 05/19/2018   MPG 91.06 05/19/2018   MPG 91 10/25/2016   Lab Results  Component Value Date   PROLACTIN 33.3 (H) 10/25/2016   PROLACTIN 66.9 (H) 12/03/2015   Lab Results  Component Value Date   CHOL 110 05/19/2018   TRIG 81 05/19/2018   HDL 42 05/19/2018   CHOLHDL 2.6 05/19/2018   VLDL 16 05/19/2018   LDLCALC 52 05/19/2018   LDLCALC 61 10/25/2016    Physical Findings: AIMS: Facial and Oral Movements Muscles of Facial Expression: None, normal Lips and Perioral Area: None, normal Jaw: None, normal Tongue: None, normal,Extremity Movements Upper (arms, wrists, hands, fingers): None, normal Lower (legs, knees, ankles, toes): None, normal, Trunk Movements Neck, shoulders, hips: None, normal, Overall Severity Severity of abnormal movements (highest score from questions above): None, normal Incapacitation due to abnormal movements: None, normal Patient's awareness of abnormal movements (rate only patient's report): No Awareness, Dental Status Current problems with teeth and/or dentures?: No Does patient usually wear dentures?: No  CIWA:    COWS:     Musculoskeletal: Strength & Muscle Tone: within normal limits Gait & Station: normal Patient leans: [name]  Psychiatric Specialty Exam: Physical Exam  ROS  Blood pressure 105/68, pulse 97, temperature 98.4 F (36.9 C), temperature source Oral,  resp. rate 18, height 6\' 1"  (1.854 m), weight 87.1 kg, SpO2 100 %.Body mass index is 25.33 kg/m.  General Appearance: Casual  Eye Contact:  Good  Speech:  Pressured  Volume:  Increased  Mood:  Angry and Irritable  Affect:  Labile  Thought Process:  Goal Directed  Orientation:  Full (Time, Place, and Person)  Thought Content:  Tangential  Suicidal Thoughts:  No  Homicidal Thoughts:  No  Memory:  Immediate;   Good  Judgement:  lacks  Insight:  Lacking  Psychomotor Activity:  Restlessness  Concentration:  Concentration: Poor  Recall:  Fair  Fund of Knowledge:  Fair  Language:  Fair  Akathisia:  Negative  Handed:  Right  AIMS (if indicated):     Assets:  Social Support  ADL's:  Intact  Cognition:  WNL  Sleep:  Number of Hours: 6.75   Plans are to escalate Zyprexa for irritability and probable mania, continue rehab and cognitive based therapies, left a message for her parents that she is now refusing rehab but we will continue to work on this perhaps guardianship is the next step  Treatment Plan Summary: Daily contact with patient to assess and evaluate symptoms and progress in treatment and Medication management  Amy Arambula, MD 05/21/2018, 9:43 AM

## 2018-05-21 NOTE — Progress Notes (Signed)
Recreation Therapy Notes  Date: 12.31.19 Time: 1000 Location: 500 Hall Dayroom  Group Topic: Goal Setting  Goal Area(s) Addresses:  Patient will be able to identify at least 3 goals for the new year. Patient will be able to identify benefit of investing in themselves.  Patient will be able to identify benefit of setting goals.   Intervention: Worksheet  Activity: Outlook for the New Year.  Patients were to identify what they want in the new year, what they need, what they want to share and what they want to succeed at in the new year.  Education:  Discharge Planning, Coping Skills, Goal Setting  Education Outcome: Acknowledges Education/In Group Clarification Provided/Needs Additional Education  Clinical Observations: Pt did not attend group.    Amy [name], LRT/CTRS         Amy [name] A 05/21/2018 11:01 AM 

## 2018-05-22 NOTE — Progress Notes (Addendum)
Per psychiatry, patient is receptive to discharging to inpatient rehab (but not [facility]). CSW to initiate referral process and follow up tomorrow, as today is a holiday.   Referral to [facility] faxed on 05/22/18.  Enid Cutter, LCSW-A Clinical Social Worker

## 2018-05-22 NOTE — Progress Notes (Signed)
[facility]  05/22/2018 9:43 AM Otho Najjardantre C Missildine  MRN:  [id number] Subjective:   Patient has now accepted that she will go to rehabilitation, she is certainly more coherent now and less manic less volatile and more reasonable.  Meds are discussed.  She generally staying to herself this morning.  No EPS or TD.  No cravings tremors or withdrawal Principal Problem: Schizoaffective psychosis (HCC) Diagnosis: Principal Problem:   Schizoaffective psychosis (HCC)  Total Time spent with patient: 20 minutes  Past Medical History:  Past Medical History:  Diagnosis Date  . Cannabis abuse   . Depression   . 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 Schizo affective schizophrenia (HCC)    History reviewed. No pertinent surgical history. Family History:  Family History  Problem Relation Age of Onset  . Mental illness Neg Hx     Social History:  Social History   Substance and Sexual Activity  Alcohol Use Not Currently     Social History   Substance and Sexual Activity  Drug Use Yes  . Frequency: 7.0 times per week  . Types: Marijuana   Comment: +THC    Social History   Socioeconomic History  . Marital status: Single    Spouse name: Not on file  . Number of children: Not on file  . Years of education: 5012  . Highest education level: 12th grade  Occupational History  . Occupation: Unemployed  Social Needs  . Financial resource strain: Not on file  . Food insecurity:    Worry: Not on file    Inability: Not on file  . Transportation needs:    Medical: Not on file    Non-medical: Not on file  Tobacco Use  . Smoking status: Current Every Day Smoker    Packs/day: 0.50    Types: Cigarettes  . Smokeless tobacco: Never Used  Substance and Sexual Activity  . Alcohol use: Not Currently  . Drug use: Yes    Frequency: 7.0 times per week    Types: Marijuana    Comment: +THC  . Sexual activity: Not Currently    Birth control/protection: None  Lifestyle  . Physical activity:   Days per week: Not on file    Minutes per session: Not on file  . Stress: Not on file  Relationships  . Social connections:    Talks on phone: Not on file    Gets together: Not on file    Attends religious service: Not on file    Active member of club or organization: Not on file    Attends meetings of clubs or organizations: Not on file    Relationship status: Not on file  Other Topics Concern  . Not on file  Social History Narrative   Pt is unemployed.  She stated [place].  Due to altered mental status, difficult to obtain history.  Per previous history, Pt lives with her parents.   Additional Social History:                         Sleep: Fair  Appetite:  Good  Current Medications: Current Facility-Administered Medications  Medication Dose Route Frequency Provider Last Rate Last Dose  . LORazepam (ATIVAN) 1 MG tablet           . acetaminophen (TYLENOL) tablet 650 mg  650 mg Oral Q6H PRN Starkes-Perry, Juel Burrowakia S, FNP      . diphenhydrAMINE (BENADRYL) capsule 50 mg  50 mg Oral Q6H PRN Maryagnes AmosStarkes-Perry, Takia S, FNP   50 mg at 05/21/18 0805  . haloperidol (HALDOL) tablet 5 mg  5 mg Oral BID Maryagnes AmosStarkes-Perry, Takia S, FNP   5 mg at 05/22/18 [id number]   Or  . haloperidol lactate (HALDOL) injection 10 mg  10 mg Intramuscular BID Maryagnes AmosStarkes-Perry, Takia S, FNP      . hydrOXYzine (ATARAX/VISTARIL) tablet 50 mg  50 mg Oral Q4H PRN Malvin JohnsFarah, Ben Habermann, MD   50 mg at 05/21/18 2222  . lithium carbonate (LITHOBID) CR tablet 300 mg  300 mg Oral Q12H Maryagnes AmosStarkes-Perry, Takia S, FNP   300 mg at 05/22/18 0753  . nicotine polacrilex (NICORETTE) gum 2 mg  2 mg Oral PRN Cobos, Rockey SituFernando A, MD   2 mg at 05/21/18 1913  . OLANZapine zydis (ZYPREXA) disintegrating tablet 15 mg  15 mg Sublingual BID Malvin JohnsFarah, Braylen Denunzio, MD   15 mg at 05/22/18 [id number]  . OLANZapine zydis (ZYPREXA) disintegrating tablet 5 mg  5 mg Oral Q8H PRN Cobos, Rockey SituFernando A, MD   5 mg at 05/21/18 1302   And  . ziprasidone (GEODON)  injection 20 mg  20 mg Intramuscular PRN Cobos, Rockey SituFernando A, MD      . traZODone (DESYREL) tablet 100 mg  100 mg Oral QHS Nira ConnBerry, Jason A, NP   100 mg at 05/18/18 2038    Lab Results: No results found for this or any previous visit (from the past 48 hour(s)).  Blood Alcohol level:  Lab Results  Component Value Date   ETH <10 05/17/2018   ETH <10 05/16/2018    Metabolic Disorder Labs: Lab Results  Component Value Date   HGBA1C 4.8 05/19/2018   MPG 91.06 05/19/2018   MPG 91 10/25/2016   Lab Results  Component Value Date   PROLACTIN 33.3 (H) 10/25/2016   PROLACTIN 66.9 (H) 12/03/2015   Lab Results  Component Value Date   CHOL 110 05/19/2018   TRIG 81 05/19/2018   HDL 42 05/19/2018   CHOLHDL 2.6 05/19/2018   VLDL 16 05/19/2018   LDLCALC 52 05/19/2018   LDLCALC 61 10/25/2016    Physical Findings: AIMS: Facial and Oral Movements Muscles of Facial Expression: None, normal Lips and Perioral Area: None, normal Jaw: None, normal Tongue: None, normal,Extremity Movements Upper (arms, wrists, hands, fingers): None, normal Lower (legs, knees, ankles, toes): None, normal, Trunk Movements Neck, shoulders, hips: None, normal, Overall Severity Severity of abnormal movements (highest score from questions above): None, normal Incapacitation due to abnormal movements: None, normal Patient's awareness of abnormal movements (rate only patient's report): No Awareness, Dental Status Current problems with teeth and/or dentures?: No Does patient usually wear dentures?: No  CIWA:    COWS:     Musculoskeletal: Strength & Muscle Tone: within normal limits Gait & Station: normal Patient leans: [name]  Psychiatric Specialty Exam: Physical Exam  ROS  Blood pressure 132/65, pulse 92, temperature 98.4 F (36.9 C), temperature source Oral, resp. rate 16, height 6\' 1"  (1.854 m), weight 87.1 kg, SpO2 100 %.Body mass index is 25.33 kg/m.  General Appearance: Casual  Eye Contact:  Good  Speech:   Clear and Coherent  Volume:  Normal  Mood:  Dysphoric  Affect:  Appropriate  Thought Process:  Coherent  Orientation:  Full (Time, Place, and Person)  Thought Content:  Tangential  Suicidal Thoughts:  No  Homicidal Thoughts:  No  Memory:  Immediate;   Good  Judgement:  Good  Insight:  Good  Psychomotor Activity:  Normal  Concentration:  Concentration: Good  Recall:  Good  Fund of Knowledge:  Good  Language:  intact  Akathisia:  Negative  Handed:  Right  AIMS (if indicated):     Assets:  Physical Health  ADL's:  Intact  Cognition:  WNL  Sleep:  Number of Hours: 4.5     Treatment Plan Summary: Daily contact with patient to assess and evaluate symptoms and progress in treatment, Medication management and Plan Continue olanzapine for schizoaffective type symptomatology continue to monitor for withdrawal continue groups and individual work also parents are seeking guardianship but in the meantime patient does agree to rehab  Malvin Johns, MD 05/22/2018, 9:43 AM

## 2018-05-22 NOTE — Plan of Care (Signed)
Progress note  D: pt found in the hallway; compliant with medication administration. Pt states she slept fair. Pt failed to fill out her self inventory. Pt continues to be pressured in speech, delusional, and tangential at times. Pt is hyperactive but has calmed down since yesterday. Pt is still childlike in her interactions and demeanor at time. Pt denies any physical pain, rating this a 0/10. Pt denies any si/hi/ah/vh and verbally agrees to approach staff if these become apparent or before harming herself or others while at [facility]. Pt is still fixated on discharge but has agreed to go to a nice rehab facility. A: pt provided support and encouragement. Pt given medication per protocol and standing orders. Q68m safety checks implemented and continued.  R: pt safe on the unit. Will continue to monitor.   Pt progressing in the following metrics  Problem: Health Behavior/Discharge Planning: Goal: Identification of resources available to assist in meeting health care needs will improve Outcome: Progressing   Problem: Physical Regulation: Goal: Ability to maintain clinical measurements within normal limits will improve Outcome: Progressing   Problem: Safety: Goal: Periods of time without injury will increase Outcome: Progressing   Problem: Activity: Goal: Will verbalize the importance of balancing activity with adequate rest periods Outcome: Progressing

## 2018-05-22 NOTE — Progress Notes (Signed)
D:  Amy [name] was in the day room prior to bedtime.  She was intrusive, argumentative, cursing at staff/peers, jumping all over the day room and difficult to redirect.  She was delusional and threatening to sue as well as telling her cousin in [place]. [place] know about how she is being treated here.  She was noted calling her mother and she started yelling, cursing at her so phone was turned off briefly.  She did take prn medication and was able to lay down and go to sleep after midnight.  She denied any pain or discomfort and appeared to be in no physical distress.  She denied any SI/HI or A/V hallucinations. A:  1:1 with RN for support and encouragement.  Medications as ordered.  Q 15 minute checks maintained for safety.  Encouraged participation in group and unit activities.   R:  Amy [name] remains safe on the unit.  We will continue to monitor the progress towards her goals.

## 2018-05-22 NOTE — Progress Notes (Signed)
Nursing Progress Note: 7p-7a D: Pt currently presents with a depressed/agitated affect and behavior. Pt states "I'm mad because I am here." Interacting minimally with the milieu. Pt reports fair sleep during the previous night with current medication regimen.   A: Pt provided with medications per providers orders. Pt's labs and vitals were monitored throughout the night. Pt supported emotionally and encouraged to express concerns and questions. Pt educated on medications.  R: Pt's safety ensured with 15 minute and environmental checks. Pt currently denies SI, HI, and AVH. Pt verbally contracts to seek staff if SI,HI, or AVH occurs and to consult with staff before acting on any harmful thoughts. Will continue to monitor.

## 2018-05-23 MED ORDER — LORAZEPAM 1 MG PO TABS
2.0000 mg | ORAL_TABLET | Freq: Once | ORAL | Status: AC
  Administered 2018-05-23: 2 mg via ORAL
  Filled 2018-05-23: qty 2

## 2018-05-23 NOTE — Plan of Care (Addendum)
D: Patient presents agitated and manic this morning. She was argumentative with staff and exhibited pressured speech. She became delusional and believed another patient had killed her mother. She began to escalate, but responded to medication (Ativan) and verbal de-escalation. After that she improved, and was able to be trialed off unit restriction. She went to the gym with her peers. Patient denies SI/HI/AVH. Her sleep was "alright" and she declined any medication to help. Her appetite is good, energy hyper, and concentration good. She rates her depression, hopelessness and anxiety 0/10. She denies physical symptoms or withdrawal complaints.  A: Patient checked q15 min, and checks reviewed. Reviewed medication changes with patient and educated on side effects. Educated patient on importance of attending group therapy sessions and educated on several coping skills. Encouarged participation in milieu through recreation therapy and attending meals with peers. Support and encouragement provided. Fluids offered. R: Patient receptive to education on medications, and is medication compliant. Patient contracts for safety on the unit. Goal: "Go home for now!!!" and "Be good everyday."

## 2018-05-23 NOTE — Progress Notes (Signed)
CSW met with patient to follow up regarding residential substance use treatment following discharge from [facility]. Patient previously told psychiatry she is not willing to go to [facility]. Patient states she won't go to [facility] or [facility]. CSW inquired where patient would be interested in following up and if she is receptive to going outside of [place]. Patient did not specify a preferred treatment center and states she isn't willing to leave the area for treatment.   Patient inquiring about discharge. Says she wants to discharge tomorrow and she has spoken to her parents who will allow her to return home. CSW shared her understanding was that patient's parents expected her to follow up with treatment prior to allowing her to return home.     CSW received a call from Lester at the [facility] [phone]) following up with financial counseling. Patient consented to meeting with a case worker to complete social security and Medicaid applications. Caseworker Anderson Malta to come to [facility] tomorrow at [place], [place] Worker

## 2018-05-23 NOTE — [facility] Group Notes (Signed)
[facility] Mental Health Association Group Therapy 05/23/2018 2:06 PM  Type of Therapy: Mental Health Association Presentation  Participation Level: Did not attend.   Tinley Rought, MSW, LCSWA 05/23/2018 2:04 PM 

## 2018-05-23 NOTE — Progress Notes (Signed)
Nursing Progress Note: 7p-7a D: Pt currently presents with a agitation/paranoid/depressed affect and behavior. Pt states "That man is trying to molest. Me. I need a sandwich tray. Everyone is saying I have AIDS. I am a virgin that bothers me." Interacting appropriately with the milieu. Pt reports good sleep during the previous night with current medication regimen. Pt did attend wrap-up group.  A: Pt provided with medications per providers orders. Pt's labs and vitals were monitored throughout the night. Pt supported emotionally and encouraged to express concerns and questions. Pt educated on medications.  R: Pt's safety ensured with 15 minute and environmental checks. Pt currently denies SI, HI, and AVH. Pt verbally contracts to seek staff if SI,HI, or AVH occurs and to consult with staff before acting on any harmful thoughts. Will continue to monitor.

## 2018-05-23 NOTE — Progress Notes (Signed)
Adult Psychoeducational Group Note  Date:  05/23/2018 Time:  9:36 PM  Group Topic/Focus:  Wrap-Up Group:   The focus of this group is to help patients review their daily goal of treatment and discuss progress on daily workbooks.  Participation Level:  Active  Participation Quality:  Appropriate  Affect:  Appropriate  Cognitive:  Appropriate  Insight: Appropriate  Engagement in Group:  Engaged  Modes of Intervention:  Discussion  Additional Comments: The patient expressed that he rates today a 10.The patient also said that he attended group.  Octavio Mannshigpen, Marae Cottrell Lee 05/23/2018, 9:36 PM

## 2018-05-23 NOTE — Progress Notes (Signed)
[facility]  05/23/2018 9:11 AM Amy [name]  MRN:  [id number] Subjective:    Patient was seen before team meeting on rounds she was generally alert oriented to person place time and situation she was agreeable to some type of rehab but expressed no psychotic symptoms denied auditory visual hallucinations denied wanting to harm self or others. Involuntary movements no withdrawal symptoms After rounds was seen in the hallway in an argument with another patient the patient herself stated that she "believes in Jesus and 1-3" and began rambling about some hyper religious type delusions that were poorly defined was encouraged to go back to her room working to give her a one-time dose of Ativan as she was again an argument with another patient about something delusional. Do not know the Zyprexa is going to be the long-term agent for her but she requested that and is taking it  Principal Problem: Schizoaffective psychosis (HCC) Diagnosis: Principal Problem:   Schizoaffective psychosis (HCC)  Total Time spent with patient: 20 minutes  Past Medical History:  Past Medical History:  Diagnosis Date  . Cannabis abuse   . Depression   . 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 Schizo affective schizophrenia (HCC)    History reviewed. No pertinent surgical history. Family History:  Family History  Problem Relation Age of Onset  . Mental illness Neg Hx    Social History:  Social History   Substance and Sexual Activity  Alcohol Use Not Currently     Social History   Substance and Sexual Activity  Drug Use Yes  . Frequency: 7.0 times per week  . Types: Marijuana   Comment: +THC    Social History   Socioeconomic History  . Marital status: Single    Spouse name: Not on file  . Number of children: Not on file  . Years of education: 8512  . Highest education level: 12th grade  Occupational History  . Occupation: Unemployed  Social Needs  . Financial resource strain: Not on file  .  Food insecurity:    Worry: Not on file    Inability: Not on file  . Transportation needs:    Medical: Not on file    Non-medical: Not on file  Tobacco Use  . Smoking status: Current Every Day Smoker    Packs/day: 0.50    Types: Cigarettes  . Smokeless tobacco: Never Used  Substance and Sexual Activity  . Alcohol use: Not Currently  . Drug use: Yes    Frequency: 7.0 times per week    Types: Marijuana    Comment: +THC  . Sexual activity: Not Currently    Birth control/protection: None  Lifestyle  . Physical activity:    Days per week: Not on file    Minutes per session: Not on file  . Stress: Not on file  Relationships  . Social connections:    Talks on phone: Not on file    Gets together: Not on file    Attends religious service: Not on file    Active member of club or organization: Not on file    Attends meetings of clubs or organizations: Not on file    Relationship status: Not on file  Other Topics Concern  . Not on file  Social History Narrative   Pt is unemployed.  She stated that she lives in [place].  Due to altered mental status, difficult to obtain history.  Per previous history, Pt lives with her parents.   Additional Social  History:                         Sleep: Fair  Appetite:  Fair  Current Medications: Current Facility-Administered Medications  Medication Dose Route Frequency Provider Last Rate Last Dose  . acetaminophen (TYLENOL) tablet 650 mg  650 mg Oral Q6H PRN Starkes-Perry, Juel Burrow, FNP      . diphenhydrAMINE (BENADRYL) capsule 50 mg  50 mg Oral Q6H PRN Maryagnes Amos, FNP   50 mg at 05/21/18 0805  . haloperidol (HALDOL) tablet 5 mg  5 mg Oral BID Maryagnes Amos, FNP   5 mg at 05/23/18 7829   Or  . haloperidol lactate (HALDOL) injection 10 mg  10 mg Intramuscular BID Maryagnes Amos, FNP      . hydrOXYzine (ATARAX/VISTARIL) tablet 50 mg  50 mg Oral Q4H PRN Malvin Johns, MD   50 mg at 05/21/18 2222  .  lithium carbonate (LITHOBID) CR tablet 300 mg  300 mg Oral Q12H Maryagnes Amos, FNP   300 mg at 05/23/18 0849  . LORazepam (ATIVAN) tablet 2 mg  2 mg Oral Once Malvin Johns, MD      . nicotine polacrilex (NICORETTE) gum 2 mg  2 mg Oral PRN Cobos, Rockey Situ, MD   2 mg at 05/21/18 1913  . OLANZapine zydis (ZYPREXA) disintegrating tablet 15 mg  15 mg Sublingual BID Malvin Johns, MD   15 mg at 05/23/18 0849  . OLANZapine zydis (ZYPREXA) disintegrating tablet 5 mg  5 mg Oral Q8H PRN Cobos, Rockey Situ, MD   5 mg at 05/21/18 1302   And  . ziprasidone (GEODON) injection 20 mg  20 mg Intramuscular PRN Cobos, Rockey Situ, MD      . traZODone (DESYREL) tablet 100 mg  100 mg Oral QHS [name] A, NP   100 mg at 05/18/18 2038    Lab Results: No results found for this or any previous visit (from the past 48 hour(s)).  Blood Alcohol level:  Lab Results  Component Value Date   ETH <10 05/17/2018   ETH <10 05/16/2018    Metabolic Disorder Labs: Lab Results  Component Value Date   HGBA1C 4.8 05/19/2018   MPG 91.06 05/19/2018   MPG 91 10/25/2016   Lab Results  Component Value Date   PROLACTIN 33.3 (H) 10/25/2016   PROLACTIN 66.9 (H) 12/03/2015   Lab Results  Component Value Date   CHOL 110 05/19/2018   TRIG 81 05/19/2018   HDL 42 05/19/2018   CHOLHDL 2.6 05/19/2018   VLDL 16 05/19/2018   LDLCALC 52 05/19/2018   LDLCALC 61 10/25/2016    Physical Findings: AIMS: Facial and Oral Movements Muscles of Facial Expression: None, normal Lips and Perioral Area: None, normal Jaw: None, normal Tongue: None, normal,Extremity Movements Upper (arms, wrists, hands, fingers): None, normal Lower (legs, knees, ankles, toes): None, normal, Trunk Movements Neck, shoulders, hips: None, normal, Overall Severity Severity of abnormal movements (highest score from questions above): None, normal Incapacitation due to abnormal movements: None, normal Patient's awareness of abnormal movements (rate  only patient's report): No Awareness, Dental Status Current problems with teeth and/or dentures?: No Does patient usually wear dentures?: No  CIWA:    COWS:     Musculoskeletal: Strength & Muscle Tone: within normal limits Gait & Station: normal Patient leans: [name]  Psychiatric Specialty Exam: Physical Exam  ROS  Blood pressure (!) 97/55, pulse 95, temperature 97.7 F (36.5 C), temperature  source Oral, resp. rate 16, height 6\' 1"  (1.854 m), weight 87.1 kg, SpO2 100 %.Body mass index is 25.33 kg/m.  General Appearance: Casual  Eye Contact:  Fair  Speech:  Pressured  Volume:  Increased  Mood:  Anxious and Irritable  Affect:  Labile  Thought Process:  Irrelevant  Orientation:  Full (Time, Place, and Person)  Thought Content:  Delusions  Suicidal Thoughts:  No  Homicidal Thoughts:  No  Memory:  Immediate;   Fair  Judgement:  Fair  Insight:  Lacking  Psychomotor Activity:  Increased  Concentration:  Concentration: Poor  Recall:  Poor  Fund of Knowledge:  Good  Language:  Good  Akathisia:  Negative  Handed:  Right  AIMS (if indicated):     Assets:  Communication Skills  ADL's:  Intact  Cognition:  WNL  Sleep:  Number of Hours: 4.5     Treatment Plan Summary: Daily contact with patient to assess and evaluate symptoms and progress in treatment, Medication management and Plan For psychosis continue Zyprexa continue reality-based therapy for withdrawal continue current precautions and vital signs for agitation give 2 mg of Ativan Rita [email or web address]  Elena Davia, MD 05/23/2018, 9:11 AM

## 2018-05-23 NOTE — Progress Notes (Signed)
D: Patient is observed sleeping, NAD. Respirations are even and unlabored. She earlier went to the gym and that was a positive experience for her. Her behavior was appropriate.  A: Will observe patient closely.  R: Patient resting.

## 2018-05-24 MED ORDER — FLUPHENAZINE HCL 5 MG PO TABS
5.0000 mg | ORAL_TABLET | Freq: Three times a day (TID) | ORAL | Status: DC
  Administered 2018-05-24 – 2018-05-25 (×3): 5 mg via ORAL
  Filled 2018-05-24 (×6): qty 1

## 2018-05-24 MED ORDER — DIPHENHYDRAMINE HCL 25 MG PO CAPS
25.0000 mg | ORAL_CAPSULE | Freq: Two times a day (BID) | ORAL | Status: DC
  Administered 2018-05-24 – 2018-05-29 (×11): 25 mg via ORAL
  Filled 2018-05-24 (×14): qty 1

## 2018-05-24 NOTE — Progress Notes (Signed)
CSW present for meeting with Amy BarterJennifer [name], director of disability services at [facility]. Patient was able to complete her applications and documentation with Amy DikeJennifer.   Enid Cutterharlotte Thang Flett, LCSW-A Clinical Social Worker

## 2018-05-24 NOTE — [facility] Group Notes (Signed)
Adult Psychoeducational Group Note  Date:  05/24/2018 Time:  10:16 PM  Group Topic/Focus:  Wrap-Up Group:   The focus of this group is to help patients review their daily goal of treatment and discuss progress on daily workbooks.  Participation Level:  Active  Participation Quality:  Appropriate and Attentive  Affect:  Appropriate  Cognitive:  Alert and Appropriate  Insight: Appropriate and Good  Engagement in Group:  Engaged  Modes of Intervention:  Discussion and Education  Additional Comments:  Pt attended and participated in wrap up group this evening. Pt rated their day a 7/10, due to them not getting discharged today. Pt did not complete their goal of being discharged today.   Chrisandra Netters 05/24/2018, 10:16 PM

## 2018-05-24 NOTE — Tx Team (Signed)
Interdisciplinary Treatment and Diagnostic Plan Update  05/24/2018 Time of Session: 9:00am Amy [name] MRN: [id number]  Principal Diagnosis: Schizoaffective psychosis ([place])  Secondary Diagnoses: Principal Problem:   Schizoaffective psychosis ([place])   Current Medications:  Current Facility-Administered Medications  Medication Dose Route Frequency Provider Last Rate Last Dose  . acetaminophen (TYLENOL) tablet 650 mg  650 mg Oral Q6H PRN Suella Broad, FNP      . diphenhydrAMINE (BENADRYL) capsule 25 mg  25 mg Oral BID Johnn Hai, MD      . diphenhydrAMINE (BENADRYL) capsule 50 mg  50 mg Oral Q6H PRN Suella Broad, FNP   50 mg at 05/21/18 0805  . fluPHENAZine (PROLIXIN) tablet 5 mg  5 mg Oral TID Johnn Hai, MD      . haloperidol (HALDOL) tablet 5 mg  5 mg Oral BID Suella Broad, FNP   5 mg at 05/24/18 0719   Or  . haloperidol lactate (HALDOL) injection 10 mg  10 mg Intramuscular BID Suella Broad, FNP      . hydrOXYzine (ATARAX/VISTARIL) tablet 50 mg  50 mg Oral Q4H PRN Johnn Hai, MD   50 mg at 05/21/18 2222  . lithium carbonate (LITHOBID) CR tablet 300 mg  300 mg Oral Q12H Suella Broad, FNP   300 mg at 05/24/18 0719  . nicotine polacrilex (NICORETTE) gum 2 mg  2 mg Oral PRN Cobos, Myer Peer, MD   2 mg at 05/21/18 1913  . OLANZapine zydis (ZYPREXA) disintegrating tablet 5 mg  5 mg Oral Q8H PRN Cobos, Myer Peer, MD   5 mg at 05/24/18 0130   And  . ziprasidone (GEODON) injection 20 mg  20 mg Intramuscular PRN Cobos, Myer Peer, MD      . traZODone (DESYREL) tablet 100 mg  100 mg Oral QHS Lindon Romp A, NP   100 mg at 05/23/18 2005   PTA Medications: Medications Prior to Admission  Medication Sig Dispense Refill Last Dose  . ARIPiprazole ER 400 MG SRER Inject 400 mg into the muscle every 28 (twenty-eight) days. (Due 11-28-16): For mood control (Patient not taking: Reported on 10/27/2017) 1 each 0 Not Taking at Unknown time  .  ibuprofen (ADVIL,MOTRIN) 800 MG tablet Take 1 tablet (800 mg total) by mouth 2 (two) times daily. With food (Patient not taking: Reported on 11/14/2017) 12 tablet 0 Completed Course at Unknown time  . lithium carbonate (LITHOBID) 300 MG CR tablet Take 1 tablet (300 mg total) by mouth every 12 (twelve) hours. (Patient not taking: Reported on 11/02/2017) 28 tablet 0 Not Taking at Unknown time  . traZODone (DESYREL) 100 MG tablet Take 1 tablet (100 mg total) by mouth at bedtime. (Patient not taking: Reported on 11/02/2017) 14 tablet 0 Not Taking at Unknown time    Patient Stressors: Marital or family conflict Medication change or noncompliance  Patient Strengths: Ability for insight Motivation for treatment/growth  Treatment Modalities: Medication Management, Group therapy, Case management,  1 to 1 session with clinician, Psychoeducation, Recreational therapy.   Physician Treatment Plan for Primary Diagnosis: Schizoaffective psychosis ([place]) Long Term Goal(s): Improvement in symptoms so as ready for discharge Improvement in symptoms so as ready for discharge   Short Term Goals: Ability to identify changes in lifestyle to reduce recurrence of condition will improve Ability to maintain clinical measurements within normal limits will improve Ability to identify changes in lifestyle to reduce recurrence of condition will improve Ability to verbalize feelings will improve Ability to disclose and  discuss suicidal ideas Ability to demonstrate self-control will improve Ability to identify and develop effective coping behaviors will improve Ability to maintain clinical measurements within normal limits will improve  Medication Management: Evaluate patient's response, side effects, and tolerance of medication regimen.  Therapeutic Interventions: 1 to 1 sessions, Unit Group sessions and Medication administration.  Evaluation of Outcomes: Not Met  Physician Treatment Plan for Secondary Diagnosis:  Principal Problem:   Schizoaffective psychosis ([place])  Long Term Goal(s): Improvement in symptoms so as ready for discharge Improvement in symptoms so as ready for discharge   Short Term Goals: Ability to identify changes in lifestyle to reduce recurrence of condition will improve Ability to maintain clinical measurements within normal limits will improve Ability to identify changes in lifestyle to reduce recurrence of condition will improve Ability to verbalize feelings will improve Ability to disclose and discuss suicidal ideas Ability to demonstrate self-control will improve Ability to identify and develop effective coping behaviors will improve Ability to maintain clinical measurements within normal limits will improve     Medication Management: Evaluate patient's response, side effects, and tolerance of medication regimen.  Therapeutic Interventions: 1 to 1 sessions, Unit Group sessions and Medication administration.  Evaluation of Outcomes: Not Met   RN Treatment Plan for Primary Diagnosis: Schizoaffective psychosis ([place]) Long Term Goal(s): Knowledge of disease and therapeutic regimen to maintain health will improve  Short Term Goals: Ability to remain free from injury will improve, Ability to verbalize frustration and anger appropriately will improve, Ability to demonstrate self-control, Ability to verbalize feelings will improve, Ability to disclose and discuss suicidal ideas and Compliance with prescribed medications will improve  Medication Management: RN will administer medications as ordered by provider, will assess and evaluate patient's response and provide education to patient for prescribed medication. RN will report any adverse and/or side effects to prescribing provider.  Therapeutic Interventions: 1 on 1 counseling sessions, Psychoeducation, Medication administration, Evaluate responses to treatment, Monitor vital signs and CBGs as ordered, Perform/monitor CIWA, COWS,  AIMS and Fall Risk screenings as ordered, Perform wound care treatments as ordered.  Evaluation of Outcomes: Not Met   LCSW Treatment Plan for Primary Diagnosis: Schizoaffective psychosis ([place]) Long Term Goal(s): Safe transition to appropriate next level of care at discharge, Engage patient in therapeutic group addressing interpersonal concerns.  Short Term Goals: Engage patient in aftercare planning with referrals and resources, Increase social support, Increase ability to appropriately verbalize feelings, Increase emotional regulation, Identify triggers associated with mental health/substance abuse issues and Increase skills for wellness and recovery  Therapeutic Interventions: Assess for all discharge needs, 1 to 1 time with Social worker, Explore available resources and support systems, Assess for adequacy in community support network, Educate family and significant other(s) on suicide prevention, Complete Psychosocial Assessment, Interpersonal group therapy.  Evaluation of Outcomes: Not Met   Progress in Treatment: Attending groups: No. Participating in groups: No. Taking medication as prescribed: Yes. Toleration medication: Yes. Family/Significant other contact made: No, will contact:  patient still declining consents Patient understands diagnosis: No. Discussing patient identified problems/goals with staff: No. Medical problems stabilized or resolved: No. Denies suicidal/homicidal ideation: No. Issues/concerns per patient self-inventory: No.  New problem(s) identified: No, Describe:  patient paranoid and disruptive on unit at times. Declining referrals for inpatient sunstance use treatment  New Short Term/Long Term Goal(s): detox, medication management for mood stabilization; elimination of SI thoughts; development of comprehensive mental wellness/sobriety plan.  Patient Goals:  Discharge.  Discharge Plan or Barriers: Continuing to assess. [place] pamphlet, [place]  Crisis  information, and AA/NA information provided to patient for additional community support and resources.   Reason for Continuation of Hospitalization: Aggression Anxiety Delusions  Depression Hallucinations Medical Issues Suicidal ideation  Estimated Length of Stay: 3-5 days  Attendees: Patient: 05/24/2018 10:46 AM  Physician: Dr.Farah 05/24/2018 10:46 AM  Nursing: Nicoletta Dress, RN 05/24/2018 10:46 AM  RN Care Manager: 05/24/2018 10:46 AM  Social Worker: Stephanie Acre, [place] 05/24/2018 10:46 AM  Recreational Therapist:  05/24/2018 10:46 AM  Other:  05/24/2018 10:46 AM  Other:  05/24/2018 10:46 AM  Other: 05/24/2018 10:46 AM    Scribe for Treatment Team: Joellen Jersey, [place] 05/24/2018 10:46 AM
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
## 2018-05-24 NOTE — Progress Notes (Signed)
[facility] MD Progress Note  05/24/2018 9:17 AM Amy [name]  MRN:  [id number] Subjective:    Patient having outburst at intervals regarding her paranoia is clear that the high-dose olanzapine is simply not holding her as far as her bipolar type symptomatology whether or not it is drug-induced predominantly. Also, patient is still not fully reality based and caseworkers discussed that she agreed to rehab options previously and when presented with him yesterday refused all options. Currently alert and oriented and cooperative but seeking discharge and again expressing a great deal of paranoia and even a sexual focus yesterday.  Therefore I do not think the Zyprexa is holding her. Involuntary movements no EPS or TD Principal Problem: Schizoaffective psychosis ([facility]) Diagnosis: Principal Problem:   Schizoaffective psychosis ([facility])  Total Time spent with patient: 20 minutes Past Medical History:  Past Medical History:  Diagnosis Date  . Cannabis abuse   . Depression   . 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 Schizo affective schizophrenia ([facility])    History reviewed. No pertinent surgical history. Family History:  Family History  Problem Relation Age of Onset  . Mental illness Neg Hx    Family Psychiatric  History: neg Social History:  Social History   Substance and Sexual Activity  Alcohol Use Not Currently     Social History   Substance and Sexual Activity  Drug Use Yes  . Frequency: 7.0 times per week  . Types: Marijuana   Comment: +THC    Social History   Socioeconomic History  . Marital status: Single    Spouse name: Not on file  . Number of children: Not on file  . Years of education: 63  . Highest education level: 12th grade  Occupational History  . Occupation: Unemployed  Social Needs  . Financial resource strain: Not on file  . Food insecurity:    Worry: Not on file    Inability: Not on file  . Transportation needs:    Medical: Not on file    Non-medical: Not on file   Tobacco Use  . Smoking status: Current Every Day Smoker    Packs/day: 0.50    Types: Cigarettes  . Smokeless tobacco: Never Used  Substance and Sexual Activity  . Alcohol use: Not Currently  . Drug use: Yes    Frequency: 7.0 times per week    Types: Marijuana    Comment: +THC  . Sexual activity: Not Currently    Birth control/protection: None  Lifestyle  . Physical activity:    Days per week: Not on file    Minutes per session: Not on file  . Stress: Not on file  Relationships  . Social connections:    Talks on phone: Not on file    Gets together: Not on file    Attends religious service: Not on file    Active member of club or organization: Not on file    Attends meetings of clubs or organizations: Not on file    Relationship status: Not on file  Other Topics Concern  . Not on file  Social History Narrative   Pt is unemployed.  She stated that she lives in [place].  Due to altered mental status, difficult to obtain history.  Per previous history, Pt lives with her parents.   Additional Social History:                         Sleep: Good  Appetite:  Fair  Current  Medications: Current Facility-Administered Medications  Medication Dose Route Frequency Provider Last Rate Last Dose  . acetaminophen (TYLENOL) tablet 650 mg  650 mg Oral Q6H PRN Maryagnes AmosStarkes-Perry, Takia S, FNP      . diphenhydrAMINE (BENADRYL) capsule 25 mg  25 mg Oral BID Malvin JohnsFarah, Shahla Betsill, MD      . diphenhydrAMINE (BENADRYL) capsule 50 mg  50 mg Oral Q6H PRN Maryagnes AmosStarkes-Perry, Takia S, FNP   50 mg at 05/21/18 0805  . fluPHENAZine (PROLIXIN) tablet 5 mg  5 mg Oral TID Malvin JohnsFarah, Randy Castrejon, MD      . haloperidol (HALDOL) tablet 5 mg  5 mg Oral BID Maryagnes AmosStarkes-Perry, Takia S, FNP   5 mg at 05/24/18 0719   Or  . haloperidol lactate (HALDOL) injection 10 mg  10 mg Intramuscular BID Maryagnes AmosStarkes-Perry, Takia S, FNP      . hydrOXYzine (ATARAX/VISTARIL) tablet 50 mg  50 mg Oral Q4H PRN Malvin JohnsFarah, Georges Victorio, MD   50 mg at 05/21/18  2222  . lithium carbonate (LITHOBID) CR tablet 300 mg  300 mg Oral Q12H Maryagnes AmosStarkes-Perry, Takia S, FNP   300 mg at 05/24/18 0719  . nicotine polacrilex (NICORETTE) gum 2 mg  2 mg Oral PRN Cobos, Rockey SituFernando A, MD   2 mg at 05/21/18 1913  . OLANZapine zydis (ZYPREXA) disintegrating tablet 5 mg  5 mg Oral Q8H PRN Cobos, Rockey SituFernando A, MD   5 mg at 05/24/18 0130   And  . ziprasidone (GEODON) injection 20 mg  20 mg Intramuscular PRN Cobos, Rockey SituFernando A, MD      . traZODone (DESYREL) tablet 100 mg  100 mg Oral QHS Nira ConnBerry, Jason A, NP   100 mg at 05/23/18 2005    Lab Results: No results found for this or any previous visit (from the past 48 hour(s)).  Blood Alcohol level:  Lab Results  Component Value Date   ETH <10 05/17/2018   ETH <10 05/16/2018    Metabolic Disorder Labs: Lab Results  Component Value Date   HGBA1C 4.8 05/19/2018   MPG 91.06 05/19/2018   MPG 91 10/25/2016   Lab Results  Component Value Date   PROLACTIN 33.3 (H) 10/25/2016   PROLACTIN 66.9 (H) 12/03/2015   Lab Results  Component Value Date   CHOL 110 05/19/2018   TRIG 81 05/19/2018   HDL 42 05/19/2018   CHOLHDL 2.6 05/19/2018   VLDL 16 05/19/2018   LDLCALC 52 05/19/2018   LDLCALC 61 10/25/2016    Physical Findings: AIMS: Facial and Oral Movements Muscles of Facial Expression: None, normal Lips and Perioral Area: None, normal Jaw: None, normal Tongue: None, normal,Extremity Movements Upper (arms, wrists, hands, fingers): None, normal Lower (legs, knees, ankles, toes): None, normal, Trunk Movements Neck, shoulders, hips: None, normal, Overall Severity Severity of abnormal movements (highest score from questions above): None, normal Incapacitation due to abnormal movements: None, normal Patient's awareness of abnormal movements (rate only patient's report): No Awareness, Dental Status Current problems with teeth and/or dentures?: No Does patient usually wear dentures?: No  CIWA:  CIWA-Ar Total: 3 COWS:  COWS Total  Score: 3  Musculoskeletal: Strength & Muscle Tone: within normal limits Gait & Station: normal Patient leans: [name]  Psychiatric Specialty Exam: Physical Exam  ROS  Blood pressure 112/75, pulse 61, temperature 97.7 F (36.5 C), temperature source Oral, resp. rate 16, height 6\' 1"  (1.854 m), weight 87.1 kg, SpO2 100 %.Body mass index is 25.33 kg/m.  General Appearance: Casual  Eye Contact:  Fair  Speech:  Pressured  Volume:  Increased  Mood:  labile  Affect:  Labile  Thought Process:  Irrelevant and Descriptions of Associations: Tangential  Orientation:  Full (Time, Place, and Person)  Thought Content:  Delusions and Tangential  Suicidal Thoughts:  No  Homicidal Thoughts:  No  Memory:  Immediate;   Fair  Judgement:  Impaired  Insight:  Lacking  Psychomotor Activity:  Increased  Concentration:  Concentration: Good  Recall:  Good  Fund of Knowledge:  Good  Language:  Good  Akathisia:  Negative  -  AIMS (if indicated): neg   Assets:  Communication Skills Social Support  ADL's:  Intact  Cognition:  WNL  Sleep:  Number of Hours: 5.75     Treatment Plan Summary: Daily contact with patient to assess and evaluate symptoms and progress in treatment, Medication management and Plan We will go ahead and switch her to Prolixin again the Zyprexa simply not working we will continue reality based therapy hopefully she will finally agree to some type of rehab parents are seeking guardianship  Malvin JohnsFARAH,Kaedynce Tapp, MD 05/24/2018, 9:17 AM

## 2018-05-24 NOTE — Plan of Care (Signed)
Progress note  D: pt found in the dayroom; compliant with medicaiton administration. Pt states she slept fair. Pt rates her depression/hopelessness/anxiety a 0/0/0 out of 10 respectively. Pt denies any physical symptoms or pain, rating her pain a 0/10. Pt failed to have a goal for today or state how she would achieve it. Pt denies any si/hi/ah/vh and verbally agrees to approach staff if these become apparent or before harm[facility] or [facility]. Pt has become more clear today and isn't having as many manic episodes as she was earlier in the week. Pt seems more lucid and coherent in her thought and speech.  A: pt provided support and encouragement. Pt given medication per protocol and standing orders. Q1550m safety checks implemented and continued. R: pt safe on the unit. Will continue to monitor.   Pt progressing in the following metrics  Problem: Education: Goal: Will be free of psychotic symptoms Outcome: Progressing Goal: Knowledge of the prescribed therapeutic regimen will improve Outcome: Progressing   Problem: Coping: Goal: Coping ability will improve Outcome: Progressing Goal: Will verbalize feelings Outcome: Progressing

## 2018-05-25 MED ORDER — FLUPHENAZINE HCL 5 MG PO TABS
10.0000 mg | ORAL_TABLET | Freq: Three times a day (TID) | ORAL | Status: DC
  Administered 2018-05-25 – 2018-05-29 (×13): 10 mg via ORAL
  Filled 2018-05-25 (×7): qty 1
  Filled 2018-05-25: qty 2
  Filled 2018-05-25 (×8): qty 1

## 2018-05-25 NOTE — Progress Notes (Signed)
D:Pt was upset when talking to her mother on the phone. After the phone conversation, pt talked to Clinical research associate about her concerns of treatment. She says that her mother wants her to go to a year long program for her schizoaffective diagnosis. Pt reports that she has been to the hospital several times since she turned 14. Pt discussed outpatient therapy with writer and treatments for her dx. Pt took her medications without complaint and has been resting in her room. A:Offered support, encouragement and 15 minute checks. R:Safety maintained on the unit.

## 2018-05-25 NOTE — Progress Notes (Signed)
[facility]  05/25/2018 10:18 AM Amy [name]  MRN:  [id number] Subjective: Patient is seen and examined.  Patient is a 23 year old female with a past psychiatric history significant for schizoaffective disorder.  The patient presented to the [facility] emergency department on 05/18/2018 after being dropped off by her mother.  The patient had been [facility] care of [facility].  She was discharged on 05/10/2018.  She was discharged on oral Abilify 20 mg at night for 15 days and then stop and to continue Abilify long-acting injectable.  She also was given Wellbutrin SR 150 mg p.o. daily.  Her long-acting Abilify injection was for 400 mg every 30 days.  Objective: Patient is a 23 year old female with a past psychiatric history significant for schizoaffective disorder who is seen in follow-up.  She still significantly ill.  She is psychomotor agitated, delusional, and notes reflect some evidence of auditory hallucinations.  She stated she was hospitalized unjustly because she has a friend in [place] that needed to be held down on an airplane.  She is requesting discharge.  She does not believe she has any problems.  She does not believe she has a psychiatric illness.  She apparently had some papers served on involuntary commitment paperwork, and I tried to explain the process.  She then afterwards was standing outside my door talking to another patient stating that she was going to be "transferred to jail because I cannot go and do anything".  Her current medications include Prolixin 5 mg p.o. 3 times daily, lithium carbonate CR 300 mg p.o. every 12 hours, and trazodone 100 mg p.o. nightly as needed.  Her lithium level on 12/26 was less than 0.06.  Drug screen on admission was positive for marijuana.  I brought up with her the possibility of going back on the Abilify injection, and she stated "I am not going to take the  shot".  Principal Problem: Schizoaffective psychosis (HCC) Diagnosis: Principal Problem:   Schizoaffective psychosis (HCC)  Total Time spent with patient: 30 minutes  Past Psychiatric History: See admission H&P  Past Medical History:  Past Medical History:  Diagnosis Date  . Cannabis abuse   . Depression   . 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 Schizo affective schizophrenia (HCC)    History reviewed. No pertinent surgical history. Family History:  Family History  Problem Relation Age of Onset  . Mental illness Neg Hx    Family Psychiatric  History: See admission H&P Social History:  Social History   Substance and Sexual Activity  Alcohol Use Not Currently     Social History   Substance and Sexual Activity  Drug Use Yes  . Frequency: 7.0 times per week  . Types: Marijuana   Comment: +THC    Social History   Socioeconomic History  . Marital status: Single    Spouse name: Not on file  . Number of children: Not on file  . Years of education: 3712  . Highest education level: 12th grade  Occupational History  . Occupation: Unemployed  Social Needs  . Financial resource strain: Not on file  . Food insecurity:    Worry: Not on file    Inability: Not on file  . Transportation needs:    Medical: Not on file    Non-medical: Not on file  Tobacco Use  . Smoking status: Current Every Day Smoker    Packs/day: 0.50  Types: Cigarettes  . Smokeless tobacco: Never Used  Substance and Sexual Activity  . Alcohol use: Not Currently  . Drug use: Yes    Frequency: 7.0 times per week    Types: Marijuana    Comment: +THC  . Sexual activity: Not Currently    Birth control/protection: None  Lifestyle  . Physical activity:    Days per week: Not on file    Minutes per session: Not on file  . Stress: Not on file  Relationships  . Social connections:    Talks on phone: Not on file    Gets together: Not on file    Attends religious service: Not on file    Active member of  club or organization: Not on file    Attends meetings of clubs or organizations: Not on file    Relationship status: Not on file  Other Topics Concern  . Not on file  Social History Narrative   Pt is unemployed.  She stated that she lives in [place].  Due to altered mental status, difficult to obtain history.  Per previous history, Pt lives with her parents.   Additional Social History:                         Sleep: Fair  Appetite:  Fair  Current Medications: Current Facility-Administered Medications  Medication Dose Route Frequency Provider Last Rate Last Dose  . acetaminophen (TYLENOL) tablet 650 mg  650 mg Oral Q6H PRN Maryagnes Amos, FNP      . diphenhydrAMINE (BENADRYL) capsule 25 mg  25 mg Oral BID Malvin Johns, MD   25 mg at 05/25/18 0800  . diphenhydrAMINE (BENADRYL) capsule 50 mg  50 mg Oral Q6H PRN Maryagnes Amos, FNP   50 mg at 05/21/18 0805  . fluPHENAZine (PROLIXIN) tablet 5 mg  5 mg Oral TID Malvin Johns, MD   5 mg at 05/25/18 0800  . haloperidol (HALDOL) tablet 5 mg  5 mg Oral BID Maryagnes Amos, FNP   5 mg at 05/25/18 0800   Or  . haloperidol lactate (HALDOL) injection 10 mg  10 mg Intramuscular BID Maryagnes Amos, FNP      . hydrOXYzine (ATARAX/VISTARIL) tablet 50 mg  50 mg Oral Q4H PRN Malvin Johns, MD   50 mg at 05/21/18 2222  . lithium carbonate (LITHOBID) CR tablet 300 mg  300 mg Oral Q12H Maryagnes Amos, FNP   300 mg at 05/25/18 0800  . nicotine polacrilex (NICORETTE) gum 2 mg  2 mg Oral PRN Cobos, Rockey Situ, MD   2 mg at 05/21/18 1913  . OLANZapine zydis (ZYPREXA) disintegrating tablet 5 mg  5 mg Oral Q8H PRN Cobos, Rockey Situ, MD   5 mg at 05/24/18 0130   And  . ziprasidone (GEODON) injection 20 mg  20 mg Intramuscular PRN Cobos, Rockey Situ, MD      . traZODone (DESYREL) tablet 100 mg  100 mg Oral QHS [name] A, NP   100 mg at 05/23/18 2005    Lab Results: No results found for this or any  previous visit (from the past 48 hour(s)).  Blood Alcohol level:  Lab Results  Component Value Date   [facility] <10 05/17/2018   ETH <10 05/16/2018    Metabolic Disorder Labs: Lab Results  Component Value Date   HGBA1C 4.8 05/19/2018   MPG 91.06 05/19/2018   MPG 91 10/25/2016   Lab Results  Component  Value Date   PROLACTIN 33.3 (H) 10/25/2016   PROLACTIN 66.9 (H) 12/03/2015   Lab Results  Component Value Date   CHOL 110 05/19/2018   TRIG 81 05/19/2018   HDL 42 05/19/2018   CHOLHDL 2.6 05/19/2018   VLDL 16 05/19/2018   LDLCALC 52 05/19/2018   LDLCALC 61 10/25/2016    Physical Findings: AIMS: Facial and Oral Movements Muscles of Facial Expression: None, normal Lips and Perioral Area: None, normal Jaw: None, normal Tongue: None, normal,Extremity Movements Upper (arms, wrists, hands, fingers): None, normal Lower (legs, knees, ankles, toes): None, normal, Trunk Movements Neck, shoulders, hips: None, normal, Overall Severity Severity of abnormal movements (highest score from questions above): None, normal Incapacitation due to abnormal movements: None, normal Patient's awareness of abnormal movements (rate only patient's report): No Awareness, Dental Status Current problems with teeth and/or dentures?: No Does patient usually wear dentures?: No  CIWA:  CIWA-Ar Total: 3 COWS:  COWS Total Score: 3  Musculoskeletal: Strength & Muscle Tone: within normal limits Gait & Station: normal Patient leans: [name]  Psychiatric Specialty Exam: Physical Exam  Nursing note and vitals reviewed. Constitutional: She is oriented to person, place, and time. She appears well-developed and well-nourished.  HENT:  Head: Normocephalic and atraumatic.  Respiratory: Effort normal.  Neurological: She is alert and oriented to person, place, and time.    ROS  Blood pressure 115/82, pulse 97, temperature 98 F (36.7 C), temperature source Oral, resp. rate 18, height 6\' 1"  (1.854 m), weight 87.1  kg, SpO2 100 %.Body mass index is 25.33 kg/m.  General Appearance: Disheveled  Eye Contact:  Fair  Speech:  Pressured  Volume:  Normal  Mood:  Anxious, Dysphoric and Irritable  Affect:  Labile  Thought Process:  Goal Directed and Descriptions of Associations: Tangential  Orientation:  Full (Time, Place, and Person)  Thought Content:  Delusions and Tangential  Suicidal Thoughts:  No  Homicidal Thoughts:  No  Memory:  Immediate;   Fair Recent;   Fair Remote;   Fair  Judgement:  Impaired  Insight:  Lacking  Psychomotor Activity:  Increased  Concentration:  Concentration: Fair and Attention Span: Fair  Recall:  [employer]  Fund of Knowledge:  Fair  Language:  Fair  Akathisia:  Negative  Handed:  Right  AIMS (if indicated):     Assets:  Desire for Improvement Housing Physical Health  ADL's:  Intact  Cognition:  WNL  Sleep:  Number of Hours: 6.5     Treatment Plan Summary: Daily contact with patient to assess and evaluate symptoms and progress in treatment, Medication management and Plan : Patient is seen and examined.  Patient is a 23 year old female with the above-stated past psychiatric history who is seen in follow-up.  Patient remains manic, with minimal insight towards her illness.  She is currently on Prolixin 5 mg p.o. 3 times daily, but she remains somewhat delusional.  Her lithium is 300 mg of the CR formulation every 12 hours but we need a lithium level prior to increasing that dosage.  I tried to discuss with her a long-acting medication so that she would not have to rely on pills, she is declined that at this point.  I am going to order a lithium level, TSH and chemistries for tomorrow morning.  I am going to increase her Prolixin to 10 mg 3 times daily.  We will get an EKG in the a.m.  No change in the PRN's are the trazodone dosage. 1.  Increase Prolixin to  10 mg p.o. 3 times daily for delusional thinking and mania. 2.  Continue lithium CR 300 mg p.o. every 12 hours for mood  stability. 3.  Get lithium level, TSH and chemistries in a.m. tomorrow. 4.  Continue trazodone 100 mg p.o. nightly for sleep. 5.  Disposition planning-in progress.  Antonieta Pert, MD 05/25/2018, 10:18 AM

## 2018-05-25 NOTE — [facility] Group Notes (Signed)
LCSW Group Therapy Note  05/25/2018    11:15am-12:00pm  Type of Therapy and Topic:  Group Therapy: Early Messages Received About Anger  Participation Level:  Did Not Attend   Description of Group:   In this group, patients shared and discussed the early messages received in their lives about anger through parental or other adult modeling, teaching, repression, punishment, violence, and more.  Participants identified how those childhood lessons influence even now how they usually or often react when angered.  The group discussed that anger is a secondary emotion and what may be the underlying emotional themes that come out through anger outbursts or that are ignored through anger suppression.  Finally, as a group there was a conversation about the workbook's quote that "There is nothing wrong with anger; it is just a sign something needs to change."     Therapeutic Goals: 1. Patients will identify one or more childhood message about anger that they received and how it was taught to them. 2. Patients will discuss how these childhood experiences have influenced and continue to influence their own expression or repression of anger even today. 3. Patients will explore possible primary emotions that tend to fuel their secondary emotion of anger. 4. Patients will learn that anger itself is normal and cannot be eliminated, and that healthier coping skills can assist with resolving conflict rather than worsening situations.  Summary of Patient Progress:  [name]  Therapeutic Modalities:   Cognitive Behavioral Therapy Motivation Interviewing  Lantz Hermann J Grossman-Orr  .  

## 2018-05-25 NOTE — B[facility] Group Notes (Signed)
Adult Psychoeducational Group Note  Date:  05/25/2018 Time:  3:49 PM  Group Topic/Focus:  Personal Choices and Values:   The focus of this group is to help patients assess and explore the importance of values in their lives, how their values affect their decisions, how they express their values and what opposes their expression.  Participation Level:  Active  Participation Quality:  Attentive  Affect:  Flat  Cognitive:  Oriented  Insight: Lacking  Engagement in Group:  Engaged  Modes of Intervention:  Activity, Discussion, Exploration, Rapport Building, Socialization and Support  Additional Comments:  Pt attended and participated during the RN Psychoeducational group this morning.  Tania Ade 05/25/2018, 3:49 PM

## 2018-05-25 NOTE — Plan of Care (Signed)
Pt presents with a flat affect and a labile mood. Pt noted to have episodes of irritability, agitation and extreme paranoia. On approach, pt was noted to be calm and polite. Late morning, pt was agitated, loud and paranoid. Pt expressed that she have to be out of here today because she have a court date tomorrow morning that she have to attend. Pt then became upset with staff after she asked for a snack. Staff informed pt that snack time is at 2:30 pm and that she just had breakfast two hours ago. Pt began to yell, cry and then walked fast to her room. Per staff, each day the pt ask for snacks all throughout the day. Staff agreed to set limits with the pt.   Medications reviewed with the pt. Medications administered as ordered per MD. Verbal support provided. Pt encouraged to attend groups. 15 minute checks performed for safety.  Pt compliant with taking medications and no side effects to meds verbalized by pt.   Care Plan  Problem: Safety: Goal: Periods of time without injury will increase Outcome: Progressing   Problem: Health Behavior/Discharge Planning: Goal: Compliance with prescribed medication regimen will improve Outcome: Progressing

## 2018-05-26 DIAGNOSIS — G4700 Insomnia, unspecified: Secondary | ICD-10-CM

## 2018-05-26 LAB — COMPREHENSIVE METABOLIC PANEL
ALT: 28 U/L (ref 0–44)
AST: 26 U/L (ref 15–41)
Albumin: 3.6 g/dL (ref 3.5–5.0)
Alkaline Phosphatase: 37 U/L — ABNORMAL LOW (ref 38–126)
Anion gap: 7 (ref 5–15)
BUN: 12 mg/dL (ref 6–20)
CO2: 24 mmol/L (ref 22–32)
Calcium: 8.7 mg/dL — ABNORMAL LOW (ref 8.9–10.3)
Chloride: 109 mmol/L (ref 98–111)
Creatinine, Ser: 0.6 mg/dL (ref 0.44–1.00)
GFR calc Af Amer: >60 mL/min (ref >60)
GFR calc non Af Amer: >60 mL/min (ref >60)
GLUCOSE: 85 mg/dL (ref 70–99)
Potassium: 4.1 mmol/L (ref 3.5–5.1)
Sodium: 140 mmol/L (ref 135–145)
Total Bilirubin: 0.9 mg/dL (ref 0.3–1.2)
Total Protein: 6.2 g/dL — ABNORMAL LOW (ref 6.5–8.1)

## 2018-05-26 LAB — LITHIUM LEVEL: Lithium Lvl: 0.3 mmol/L — ABNORMAL LOW (ref 0.60–1.20)

## 2018-05-26 LAB — TSH: TSH: 1.504 u[IU]/mL (ref 0.350–4.500)

## 2018-05-26 MED ORDER — LITHIUM CARBONATE ER 450 MG PO TBCR
450.0000 mg | EXTENDED_RELEASE_TABLET | Freq: Two times a day (BID) | ORAL | Status: DC
  Administered 2018-05-26 – 2018-05-29 (×6): 450 mg via ORAL
  Filled 2018-05-26 (×8): qty 1

## 2018-05-26 NOTE — Plan of Care (Signed)
D: Patient presents calm and cooperative. She is showing improvement. Her affect is pleasant, cooperative, euthymic. She is agreeable to getting an EKG, which she previously had been refusing. She slept fair last night and did not request medication for sleep. Her appetite is fair, energy high and concentration good. She rates her depression, hopelessness and anxiety 0/10. She denies withdrawal symptoms or physical complaints. Patient denies SI/HI/AVH.  A: Patient checked q15 min, and checks reviewed. Reviewed medication changes with patient and educated on side effects. Educated patient on importance of attending group therapy sessions and educated on several coping skills. Encouarged participation in milieu through recreation therapy and attending meals with peers. Support and encouragement provided. Fluids offered. R: Patient receptive to education on medications, and is medication compliant. Patient contracts for safety on the unit. Her goal: "discharge."

## 2018-05-26 NOTE — BHH Counselor (Signed)
Clinical Social Work Note  Met briefly with pt's mother and father who have filed to become legal guardians, with a court date set for 06/04/2018.  They have been talking to doctor about this, and are looking forward to receiving a letter from the doctor in support of such a move.  Once they become guardians, they will be able to participate in developing a discharge plan that is helpful to pt who is currently refusing such.  Selmer Dominion, LCSW 05/26/2018, 5:16 PM

## 2018-05-26 NOTE — BHH Group Notes (Signed)
[facility] LCSW Group Therapy Note  Date/Time:  05/26/2018  11:00AM-12:00PM  Type of Therapy and Topic:  Group Therapy:  Music and Mood  Participation Level:  Active   Description of Group: In this process group, members listened to a variety of genres of music and identified that different types of music evoke different responses.  Patients were encouraged to identify music that was soothing for them and music that was energizing for them.  Patients discussed how this knowledge can help with wellness and recovery in various ways including managing depression and anxiety as well as encouraging healthy sleep habits.    Therapeutic Goals: 1. Patients will explore the impact of different varieties of music on mood 2. Patients will verbalize the thoughts they have when listening to different types of music 3. Patients will identify music that is soothing to them as well as music that is energizing to them 4. Patients will discuss how to use this knowledge to assist in maintaining wellness and recovery 5. Patients will explore the use of music as a coping skill  Summary of Patient Progress:  At the beginning of group, patient expressed that she wanted to be able to choose his own song.  Because others in group expressed the same, this was done after songs had been looked up to ensure appropriateness.  She sang along quite a bit, left the room frequently but would then return.  Therapeutic Modalities: Solution Focused Brief Therapy Activity   Ambrose Mantle, LCSW

## 2018-05-26 NOTE — Progress Notes (Signed)
Patient ID: Amy [name], female   DOB: [birth year], 23 y.o.   MRN: [id number] D: Assumed care patient @ 2330. Patient in bed sleeping. Respiration regular and unlabored. No sign of distress noted at this time A: 15 mins checks for safety. R: Patient remains safe.

## 2018-05-26 NOTE — Progress Notes (Signed)
[facility] MD Progress Note  05/26/2018 11:31 AM Amy [name]  MRN:  [id number] Subjective:  Patient is seen and examined.  Patient is a 23 year old female with a past psychiatric history significant for schizoaffective disorder.  The patient presented to the [facility] emergency department on 05/18/2018 after being dropped off by her mother.  The patient had been discharged from [facility] to the care of [facility].  She was discharged on 05/10/2018.  She was discharged on oral Abilify 20 mg at night for 15 days and then stop and to continue Abilify long-acting injectable.  She also was given Wellbutrin SR 150 mg p.o. daily.  Her long-acting Abilify injection was for 400 mg every 30 days.  Objective: Patient is a 23 year old female with the above-stated past psychiatric history seen in follow-up.  She is less problematic is yesterday.  She is resting in her bed more comfortably.  Her fixation on delusional thinking and auditory hallucinations has somewhat decreased.  She seems more pleasant today, and less agitated.  She denied any side effects to her current medications.  Her last lithium level was 0.6.  She continues on Prolixin, lithium and PRN trazodone.  Her vital signs are stable, she is afebrile.  She slept 6.25 hours last night.  Principal Problem: Schizoaffective psychosis (HCC) Diagnosis: Principal Problem:   Schizoaffective psychosis (HCC)  Total Time spent with patient: 15 minutes  Past Psychiatric History: See admission H&P  Past Medical History:  Past Medical History:  Diagnosis Date  . Cannabis abuse   . Depression   . 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 Schizo affective schizophrenia (HCC)    History reviewed. No pertinent surgical history. Family History:  Family History  Problem Relation Age of Onset  . Mental illness Neg Hx    Family Psychiatric  History: See admission H&P Social History:  Social History   Substance and  Sexual Activity  Alcohol Use Not Currently     Social History   Substance and Sexual Activity  Drug Use Yes  . Frequency: 7.0 times per week  . Types: Marijuana   Comment: +THC    Social History   Socioeconomic History  . Marital status: Single    Spouse name: Not on file  . Number of children: Not on file  . Years of education: 57  . Highest education level: 12th grade  Occupational History  . Occupation: Unemployed  Social Needs  . Financial resource strain: Not on file  . Food insecurity:    Worry: Not on file    Inability: Not on file  . Transportation needs:    Medical: Not on file    Non-medical: Not on file  Tobacco Use  . Smoking status: Current Every Day Smoker    Packs/day: 0.50    Types: Cigarettes  . Smokeless tobacco: Never Used  Substance and Sexual Activity  . Alcohol use: Not Currently  . Drug use: Yes    Frequency: 7.0 times per week    Types: Marijuana    Comment: +THC  . Sexual activity: Not Currently    Birth control/protection: None  Lifestyle  . Physical activity:    Days per week: Not on file    Minutes per session: Not on file  . Stress: Not on file  Relationships  . Social connections:    Talks on phone: Not on file    Gets together: Not on file    Attends religious service: Not  on file    Active member of club or organization: Not on file    Attends meetings of clubs or organizations: Not on file    Relationship status: Not on file  Other Topics Concern  . Not on file  Social History Narrative   Pt is unemployed.  She stated that she [place] in [place].  Due to altered mental status, difficult to obtain history.  Per previous history, Pt lives with her parents.   Additional Social History:                         Sleep: Good  Appetite:  Good  Current Medications: Current Facility-Administered Medications  Medication Dose Route Frequency Provider Last Rate Last Dose  . acetaminophen (TYLENOL) tablet 650 mg   650 mg Oral Q6H PRN Maryagnes AmosStarkes-Perry, Takia S, FNP      . diphenhydrAMINE (BENADRYL) capsule 25 mg  25 mg Oral BID Malvin JohnsFarah, Brian, MD   25 mg at 05/26/18 0905  . diphenhydrAMINE (BENADRYL) capsule 50 mg  50 mg Oral Q6H PRN Maryagnes AmosStarkes-Perry, Takia S, FNP   50 mg at 05/21/18 0805  . fluPHENAZine (PROLIXIN) tablet 10 mg  10 mg Oral TID Antonieta Pertlary, Jawan Chavarria Lawson, MD   10 mg at 05/26/18 [id number]  . haloperidol (HALDOL) tablet 5 mg  5 mg Oral BID Maryagnes AmosStarkes-Perry, Takia S, FNP   5 mg at 05/26/18 [id number]   Or  . haloperidol lactate (HALDOL) injection 10 mg  10 mg Intramuscular BID Maryagnes AmosStarkes-Perry, Takia S, FNP      . hydrOXYzine (ATARAX/VISTARIL) tablet 50 mg  50 mg Oral Q4H PRN Malvin JohnsFarah, Brian, MD   50 mg at 05/21/18 2222  . lithium carbonate (ESKALITH) CR tablet 450 mg  450 mg Oral Q12H Antonieta Pertlary, Brance Dartt Lawson, MD      . nicotine polacrilex (NICORETTE) gum 2 mg  2 mg Oral PRN Cobos, Rockey SituFernando A, MD   2 mg at 05/21/18 1913  . OLANZapine zydis (ZYPREXA) disintegrating tablet 5 mg  5 mg Oral Q8H PRN Cobos, Rockey SituFernando A, MD   5 mg at 05/24/18 0130   And  . ziprasidone (GEODON) injection 20 mg  20 mg Intramuscular PRN Cobos, Rockey SituFernando A, MD      . traZODone (DESYREL) tablet 100 mg  100 mg Oral QHS Nira ConnBerry, Jason A, NP   100 mg at 05/23/18 2005    Lab Results:  Results for orders placed or performed during the hospital encounter of 05/18/18 (from the past 48 hour(s))  Lithium level     Status: Abnormal   Collection Time: 05/26/18  6:39 AM  Result Value Ref Range   Lithium Lvl 0.30 (L) 0.60 - 1.20 mmol/L    Comment: Performed at [facility], [address] [place], [place] [address]  Comprehensive metabolic panel     Status: Abnormal   Collection Time: 05/26/18  6:39 AM  Result Value Ref Range   Sodium 140 135 - 145 mmol/L   Potassium 4.1 3.5 - 5.1 mmol/L   Chloride 109 98 - 111 mmol/L   CO2 24 22 - 32 mmol/L   Glucose, Bld 85 70 - 99 mg/dL   BUN 12 6 - 20 mg/dL   Creatinine, Ser 1.190.60 0.44 - 1.00 mg/dL   Calcium  8.7 (L) 8.9 - 10.3 mg/dL   Total Protein 6.2 (L) 6.5 - 8.1 g/dL   Albumin 3.6 3.5 - 5.0 g/dL   AST 26 15 - 41 U/L  ALT 28 0 - 44 U/L   Alkaline Phosphatase 37 (L) 38 - 126 U/L   Total Bilirubin 0.9 0.3 - 1.2 mg/dL   GFR calc non Af Amer >60 >60 mL/min   GFR calc Af Amer >60 >60 mL/min   Anion gap 7 5 - 15    Comment: Performed at [facility], [address] [place], [place] [address]  TSH     Status: None   Collection Time: 05/26/18  6:39 AM  Result Value Ref Range   TSH 1.504 0.350 - 4.500 uIU/mL    Comment: Performed by a 3rd Generation assay with a functional sensitivity of <=0.01 uIU/mL. Performed at [facility], [address] [place], [place] [address]     Blood Alcohol level:  Lab Results  Component Value Date   ETH <10 05/17/2018   ETH <10 05/16/2018    Metabolic Disorder Labs: Lab Results  Component Value Date   HGBA1C 4.8 05/19/2018   MPG 91.06 05/19/2018   MPG 91 10/25/2016   Lab Results  Component Value Date   PROLACTIN 33.3 (H) 10/25/2016   PROLACTIN 66.9 (H) 12/03/2015   Lab Results  Component Value Date   CHOL 110 05/19/2018   TRIG 81 05/19/2018   HDL 42 05/19/2018   CHOLHDL 2.6 05/19/2018   VLDL 16 05/19/2018   LDLCALC 52 05/19/2018   LDLCALC 61 10/25/2016    Physical Findings: AIMS: Facial and Oral Movements Muscles of Facial Expression: None, normal Lips and Perioral Area: None, normal Jaw: None, normal Tongue: None, normal,Extremity Movements Upper (arms, wrists, hands, fingers): None, normal Lower (legs, knees, ankles, toes): None, normal, Trunk Movements Neck, shoulders, hips: None, normal, Overall Severity Severity of abnormal movements (highest score from questions above): None, normal Incapacitation due to abnormal movements: None, normal Patient's awareness of abnormal movements (rate only patient's report): No Awareness, Dental Status Current problems with teeth and/or dentures?:  No Does patient usually wear dentures?: No  CIWA:  CIWA-Ar Total: 0 COWS:  COWS Total Score: 0  Musculoskeletal: Strength & Muscle Tone: within normal limits Gait & Station: normal Patient leans: [name]  Psychiatric Specialty Exam: Physical Exam  Nursing note and vitals reviewed. Constitutional: She is oriented to person, place, and time. She appears well-developed and well-nourished.  HENT:  Head: Normocephalic and atraumatic.  Respiratory: Effort normal and breath sounds normal.  Neurological: She is alert and oriented to person, place, and time.    ROS  Blood pressure 115/79, pulse 91, temperature 98.4 F (36.9 C), temperature source Oral, resp. rate 18, height 6\' 1"  (1.854 m), weight 87.1 kg, SpO2 100 %.Body mass index is 25.33 kg/m.  General Appearance: Disheveled  Eye Contact:  Fair  Speech:  Normal Rate  Volume:  Decreased  Mood:  Dysphoric  Affect:  Congruent  Thought Process:  Coherent and Descriptions of Associations: Intact  Orientation:  Full (Time, Place, and Person)  Thought Content:  Delusions  Suicidal Thoughts:  No  Homicidal Thoughts:  No  Memory:  Immediate;   Fair Recent;   Fair Remote;   Fair  Judgement:  Intact  Insight:  Fair  Psychomotor Activity:  Decreased  Concentration:  Concentration: Fair and Attention Span: Fair  Recall:  [employer] of Knowledge:  Fair  Language:  Fair  Akathisia:  Negative  Handed:  Right  AIMS (if indicated):     Assets:  Communication Skills Desire for Improvement Housing Physical Health Resilience  ADL's:  Intact  Cognition:  WNL  Sleep:  Number of Hours: 6.25     Treatment Plan Summary: Daily contact with patient to assess and evaluate symptoms and progress in treatment, Medication management and Plan : Patient is seen and examined.  Patient is a 23 year old female with the above-stated past psychiatric history who is seen in follow-up.  Patient is doing better today.  She is less manic than yesterday.  She  does have improved insight.  Her lithium level this a.m. is come back at 0.30.  I am going to increase her lithium dose to lithium CR 450 mg p.o. twice daily.  This may allow [place] to decrease her Prolixin dose.  Her TSH is normal, her electrolytes were all normal.  No other changes in her medications. 1.  Continue Prolixin 10 mg p.o. 3 times daily for delusional thinking and mania. 2.  Increase lithium CR to 4 and 50 mg p.o. every 12 hours for mood stability. 3.  Continue trazodone 100 mg p.o. nightly as needed for sleep. 4.  Disposition planning-in progress.  Antonieta Pert, MD 05/26/2018, 11:31 AM

## 2018-05-27 LAB — LITHIUM LEVEL: Lithium Lvl: 0.36 mmol/L — ABNORMAL LOW (ref 0.60–1.20)

## 2018-05-27 NOTE — Progress Notes (Signed)
[facility] MD Progress Note  05/27/2018 11:45 AM Amy [name]  MRN:  [id number] Subjective:    Patient is generally more contained and non-manic on the Prolixin however her insight is lacking she is heard yelling on the phone to her parents who are pursuing guardianship which we support.  She denies cravings tremors and withdrawal denies wanting to harm self or others very focused on leaving states she "wants to go to work tomorrow but of course this is actually delusional there is no job waiting for her tomorrow  Principal Problem: Schizoaffective psychosis (HCC) Diagnosis: Principal Problem:   Schizoaffective psychosis (HCC)  Total Time spent with patient: 20 minutes  Past Medical History:  Past Medical History:  Diagnosis Date  . Cannabis abuse   . Depression   . 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 Schizo affective schizophrenia (HCC)    History reviewed. No pertinent surgical history. Family History:  Family History  Problem Relation Age of Onset  . Mental illness Neg Hx    Family Psychiatric  History: neg Social History:  Social History   Substance and Sexual Activity  Alcohol Use Not Currently     Social History   Substance and Sexual Activity  Drug Use Yes  . Frequency: 7.0 times per week  . Types: Marijuana   Comment: +THC    Social History   Socioeconomic History  . Marital status: Single    Spouse name: Not on file  . Number of children: Not on file  . Years of education: 9  . Highest education level: 12th grade  Occupational History  . Occupation: Unemployed  Social Needs  . Financial resource strain: Not on file  . Food insecurity:    Worry: Not on file    Inability: Not on file  . Transportation needs:    Medical: Not on file    Non-medical: Not on file  Tobacco Use  . Smoking status: Current Every Day Smoker    Packs/day: 0.50    Types: Cigarettes  . Smokeless tobacco: Never Used  Substance and Sexual Activity  . Alcohol use: Not Currently  .  Drug use: Yes    Frequency: 7.0 times per week    Types: Marijuana    Comment: +THC  . Sexual activity: Not Currently    Birth control/protection: None  Lifestyle  . Physical activity:    Days per week: Not on file    Minutes per session: Not on file  . Stress: Not on file  Relationships  . Social connections:    Talks on phone: Not on file    Gets together: Not on file    Attends religious service: Not on file    Active member of club or organization: Not on file    Attends meetings of clubs or organizations: Not on file    Relationship status: Not on file  Other Topics Concern  . Not on file  Social History Narrative   Pt is unemployed.  She stated that she lives in [place].  Due to altered mental status, difficult to obtain history.  Per previous history, Pt lives with her parents.   Additional Social History:                         Sleep: Good  Appetite:  Good  Current Medications: Current Facility-Administered Medications  Medication Dose Route Frequency Provider Last Rate Last Dose  . acetaminophen (TYLENOL) tablet 650 mg  650 mg Oral  Q6H PRN Maryagnes Amos, FNP      . diphenhydrAMINE (BENADRYL) capsule 25 mg  25 mg Oral BID Malvin Johns, MD   25 mg at 05/27/18 0818  . diphenhydrAMINE (BENADRYL) capsule 50 mg  50 mg Oral Q6H PRN Maryagnes Amos, FNP   50 mg at 05/21/18 0805  . fluPHENAZine (PROLIXIN) tablet 10 mg  10 mg Oral TID Antonieta Pert, MD   10 mg at 05/27/18 0818  . haloperidol (HALDOL) tablet 5 mg  5 mg Oral BID Maryagnes Amos, FNP   5 mg at 05/27/18 0818   Or  . haloperidol lactate (HALDOL) injection 10 mg  10 mg Intramuscular BID Maryagnes Amos, FNP      . hydrOXYzine (ATARAX/VISTARIL) tablet 50 mg  50 mg Oral Q4H PRN Malvin Johns, MD   50 mg at 05/21/18 2222  . lithium carbonate (ESKALITH) CR tablet 450 mg  450 mg Oral Q12H Antonieta Pert, MD   450 mg at 05/27/18 0818  . nicotine polacrilex  (NICORETTE) gum 2 mg  2 mg Oral PRN Cobos, Rockey Situ, MD   2 mg at 05/21/18 1913  . OLANZapine zydis (ZYPREXA) disintegrating tablet 5 mg  5 mg Oral Q8H PRN Cobos, Rockey Situ, MD   5 mg at 05/24/18 0130   And  . ziprasidone (GEODON) injection 20 mg  20 mg Intramuscular PRN Cobos, Rockey Situ, MD      . traZODone (DESYREL) tablet 100 mg  100 mg Oral QHS [name] A, NP   100 mg at 05/23/18 2005    Lab Results:  Results for orders placed or performed during the hospital encounter of 05/18/18 (from the past 48 hour(s))  Lithium level     Status: Abnormal   Collection Time: 05/26/18  6:39 AM  Result Value Ref Range   Lithium Lvl 0.30 (L) 0.60 - 1.20 mmol/L    Comment: Performed at [facility], [address] [place], [place] [address]  Comprehensive metabolic panel     Status: Abnormal   Collection Time: 05/26/18  6:39 AM  Result Value Ref Range   Sodium 140 135 - 145 mmol/L   Potassium 4.1 3.5 - 5.1 mmol/L   Chloride 109 98 - 111 mmol/L   CO2 24 22 - 32 mmol/L   Glucose, Bld 85 70 - 99 mg/dL   BUN 12 6 - 20 mg/dL   Creatinine, Ser 3.89 0.44 - 1.00 mg/dL   Calcium 8.7 (L) 8.9 - 10.3 mg/dL   Total Protein 6.2 (L) 6.5 - 8.1 g/dL   Albumin 3.6 3.5 - 5.0 g/dL   AST 26 15 - 41 U/L   ALT 28 0 - 44 U/L   Alkaline Phosphatase 37 (L) 38 - 126 U/L   Total Bilirubin 0.9 0.3 - 1.2 mg/dL   GFR calc non Af Amer >60 >60 mL/min   GFR calc Af Amer >60 >60 mL/min   Anion gap 7 5 - 15    Comment: Performed at [facility], [address], [place], [place] [address]  TSH     Status: None   Collection Time: 05/26/18  6:39 AM  Result Value Ref Range   TSH 1.504 0.350 - 4.500 uIU/mL    Comment: Performed by a 3rd Generation assay with a functional sensitivity of <=0.01 uIU/mL. Performed at [facility], [address], [place], [place] [address]   Lithium level     Status: Abnormal  Collection Time: 05/27/18  6:31 AM  Result Value Ref  Range   Lithium Lvl 0.36 (L) 0.60 - 1.20 mmol/L    Comment: Performed at [facility], [address] [place], [place] [address]    Blood Alcohol level:  Lab Results  Component Value Date   ETH <10 05/17/2018   ETH <10 05/16/2018    Metabolic Disorder Labs: Lab Results  Component Value Date   HGBA1C 4.8 05/19/2018   MPG 91.06 05/19/2018   MPG 91 10/25/2016   Lab Results  Component Value Date   PROLACTIN 33.3 (H) 10/25/2016   PROLACTIN 66.9 (H) 12/03/2015   Lab Results  Component Value Date   CHOL 110 05/19/2018   TRIG 81 05/19/2018   HDL 42 05/19/2018   CHOLHDL 2.6 05/19/2018   VLDL 16 05/19/2018   LDLCALC 52 05/19/2018   LDLCALC 61 10/25/2016    Physical Findings: AIMS: Facial and Oral Movements Muscles of Facial Expression: None, normal Lips and Perioral Area: None, normal Jaw: None, normal Tongue: None, normal,Extremity Movements Upper (arms, wrists, hands, fingers): None, normal Lower (legs, knees, ankles, toes): None, normal, Trunk Movements Neck, shoulders, hips: None, normal, Overall Severity Severity of abnormal movements (highest score from questions above): None, normal Incapacitation due to abnormal movements: None, normal Patient's awareness of abnormal movements (rate only patient's report): No Awareness, Dental Status Current problems with teeth and/or dentures?: No Does patient usually wear dentures?: No  CIWA:  CIWA-Ar Total: 0 COWS:  COWS Total Score: 0  Musculoskeletal: Strength & Muscle Tone: within normal limits Gait & Station: normal Patient leans: [name]  Psychiatric Specialty Exam: Physical Exam  ROS  Blood pressure 109/60, pulse 94, temperature 98.2 F (36.8 C), temperature source Oral, resp. rate 18, height 6\' 1"  (1.854 m), weight 87.1 kg, SpO2 100 %.Body mass index is 25.33 kg/m.  General Appearance: Casual  Eye Contact:  Good  Speech:  Clear and Coherent  Volume:  Increased  Mood:  Irritable  Affect:   Labile  Thought Process:  Goal Directed  Orientation:  Full (Time, Place, and Person)  Thought Content:  Tangential  Suicidal Thoughts:  No  Homicidal Thoughts:  No  Memory:  Immediate;   Fair  Judgement:  poor  Insight:  poor  Psychomotor Activity:  Increased  Concentration:  Concentration: Fair  Recall:  [employer]  Fund of Knowledge:  Fair  Language:  Fair  Akathisia:  Negative  Handed:  Right  AIMS (if indicated):     Assets:  Physical Health Resilience  ADL's:  Intact  Cognition:  WNL  Sleep:  Number of Hours: 6.25     Treatment Plan Summary: Daily contact with patient to assess and evaluate symptoms and progress in treatment, Medication management and Plan 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

## 2018-05-27 NOTE — [facility] Group Notes (Signed)
Adult Psychoeducational Group Note  Date:  05/27/2018 Time:  4:24 PM  Group Topic/Focus:  Wellness Toolbox:   The focus of this group is to discuss various aspects of wellness, balancing those aspects and exploring ways to increase the ability to experience wellness.  Patients will create a wellness toolbox for use upon discharge.  Participation Level:  Active  Participation Quality:  Attentive  Affect:  Flat  Cognitive:  Disorganized  Insight: Lacking  Engagement in Group:  Poor  Modes of Intervention:  Clarification  Additional Comments:    Donell Beers 05/27/2018, 4:24 PM

## 2018-05-27 NOTE — Plan of Care (Signed)
  Problem: Education: Goal: Emotional status will improve Outcome: Progressing Goal: Mental status will improve Outcome: Progressing Goal: Verbalization of understanding the information provided will improve Outcome: Progressing   Problem: Activity: Goal: Interest or engagement in activities will improve Outcome: Progressing Goal: Sleeping patterns will improve Outcome: Progressing   

## 2018-05-27 NOTE — Progress Notes (Signed)
Patient ID: Amy [name], female   DOB: [birth year], 23 y.o.   MRN: [id number] D: Patient in room on approach. Pt reports she is doing well. Pt reports she had a good visit with her mother this evening. Pt reports she is tolerating medication well. Pt behavior is calmer and appropriate than previous days. Pt denies SI/HI/AVH and pain. Cooperative with assessment. No acute distressed noted at this time.   A: Medications administered as prescribed. Support and encouragement provided as needed. Pt encouraged to discuss feelings and come to staff with any question or concerns.   R: Patient remains safe and complaint with medications.

## 2018-05-27 NOTE — Plan of Care (Signed)
  Problem: Education: Goal: Emotional status will improve 05/27/2018 2023 by Dewayne Shorter, RN Outcome: Progressing 05/27/2018 0914 by Dewayne Shorter, RN Outcome: Progressing Goal: Mental status will improve 05/27/2018 2023 by Dewayne Shorter, RN Outcome: Progressing 05/27/2018 0914 by Dewayne Shorter, RN Outcome: Progressing Goal: Verbalization of understanding the information provided will improve 05/27/2018 2023 by Dewayne Shorter, RN Outcome: Progressing 05/27/2018 0914 by Dewayne Shorter, RN Outcome: Progressing   Problem: Activity: Goal: Interest or engagement in activities will improve 05/27/2018 2023 by Dewayne Shorter, RN Outcome: Progressing 05/27/2018 0914 by Dewayne Shorter, RN Outcome: Progressing Goal: Sleeping patterns will improve 05/27/2018 2023 by Dewayne Shorter, RN Outcome: Progressing 05/27/2018 0914 by Dewayne Shorter, RN Outcome: Progressing   Problem: Coping: Goal: Ability to verbalize frustrations and anger appropriately will improve Outcome: Progressing

## 2018-05-27 NOTE — Progress Notes (Signed)
CSW spoke with patient at patient request regarding court dates (IVC and her parent's petitioning for guardianship).  Patient's court date for her IVC was scheduled for Tuesday, 05/28/2018. CSW spoke with patient's public defender, Maylon Cos. Christean Grief states court dates for this week have been postponed due to the court being closed for the holidays. Christean Grief will meet with the patient at [facility] on Wednesday, 05/29/2018. CSW relayed that patient would like to be present for her IVC court date.   Patient reports anxiety regarding her court date, regarding her parents pursing guardianship. Patient not agreeable; patient aware parents want patient to go to rehab in [facility]. Patient states she is now agreeable to a [facility] referral if it will help her case. CSW agreed to fax out a [facility] referral. Of note, the patient was declined by [facility] earlier due to mental health acuity.  Enid Cutter, LCSW-A Clinical Social Worker

## 2018-05-27 NOTE — Progress Notes (Signed)
Recreation Therapy Notes  Date: 1.6.20 Time: 1000 Location: [address] Hall Dayroom  Group Topic: Coping Skills  Goal Area(s) Addresses:  Pt will be able to identify positive coping skills. Pt will be able to identify benefits of using positive coping skills. Pt will be able to identify benefits of using coping skills post d/c.  Behavioral Response: Minimal  Intervention: Worksheet  Activity: Coping Skills A to Z.  Patients worked together to Licensed conveyancer for each letter of the alphabet.   Education: Pharmacologist, Building control surveyor.   Education Outcome: Acknowledges understanding/In group clarification offered/Needs additional education.   Clinical Observations/Feedback: Pt arrived to for the last five minutes of group.  Pt was caught up on the activity by her peers.  Pt was appropriate for the time she was in group.    Caroll Rancher, LRT/CTRS      Caroll Rancher A 05/27/2018 11:24 AM

## 2018-05-28 NOTE — Progress Notes (Signed)
Nursing Progress Note: 7p-7a D: Pt currently presents with a anxious/guarded affect and behavior. Pt states "I am so excited to leave tomorrow. I am ready to go home." Interacting appropriately with the milieu. Pt reports good sleep during the previous night with current medication regimen. Pt did attend wrap-up group.  A: Pt provided with medications per providers orders. Pt's labs and vitals were monitored throughout the night. Pt supported emotionally and encouraged to express concerns and questions. Pt educated on medications.  R: Pt's safety ensured with 15 minute and environmental checks. Pt currently denies SI, HI, and AVH. Pt verbally contracts to seek staff if SI,HI, or AVH occurs and to consult with staff before acting on any harmful thoughts. Will continue to monitor.

## 2018-05-28 NOTE — Progress Notes (Signed)
Recreation Therapy Notes  Date: 1.7.20 Time: 1000 Location: 500 Hall Dayroom  Group Topic: Wellness  Goal Area(s) Addresses:  Patient will define components of whole wellness. Patient will verbalize benefit of whole wellness.  Behavioral Response: None  Intervention: Exercise  Activity: LRT led group in a series of stretches to loosen up the muscles.  Each patient then got the opportunity to lead the group in an exercise of their choice.  Patients could take breaks and get water as needed.  Education: Wellness, Building control surveyor.   Education Outcome: Acknowledges education/In group clarification offered/Needs additional education.   Clinical Observations/Feedback: Pt came in near the end of group.  Pt sat and observed.   Caroll Rancher, LRT/CTRS         Lillia Abed, Louay Myrie A 05/28/2018 10:45 AM

## 2018-05-28 NOTE — Progress Notes (Signed)
[facility]  05/28/2018 10:17 AM Amy [name]  MRN:  [id number] Subjective:   Patient finally shows good mood stability and a euthymic mood and she shows good insight knowing that it is best for her parents to have guardianship knowing her need for rehab.  Her lithium level subtherapeutic but it is adequate and she is responding fully at this point.  No thoughts of harming self or others no cravings tremors or withdrawal no manic symptoms.  Prepared for discharge tomorrow  Principal Problem: Schizoaffective psychosis (HCC) Diagnosis: Principal Problem:   Schizoaffective psychosis (HCC)  Total Time spent with patient: 20 minutes  Past Medical History:  Past Medical History:  Diagnosis Date  . Cannabis abuse   . Depression   . 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 Schizo affective schizophrenia (HCC)    History reviewed. No pertinent surgical history. Family History:  Family History  Problem Relation Age of Onset  . Mental illness Neg Hx     Social History:  Social History   Substance and Sexual Activity  Alcohol Use Not Currently     Social History   Substance and Sexual Activity  Drug Use Yes  . Frequency: 7.0 times per week  . Types: Marijuana   Comment: +THC    Social History   Socioeconomic History  . Marital status: Single    Spouse name: Not on file  . Number of children: Not on file  . Years of education: 5912  . Highest education level: 12th grade  Occupational History  . Occupation: Unemployed  Social Needs  . Financial resource strain: Not on file  . Food insecurity:    Worry: Not on file    Inability: Not on file  . Transportation needs:    Medical: Not on file    Non-medical: Not on file  Tobacco Use  . Smoking status: Current Every Day Smoker    Packs/day: 0.50    Types: Cigarettes  . Smokeless tobacco: Never Used  Substance and Sexual Activity  . Alcohol use: Not Currently  . Drug use: Yes    Frequency: 7.0 times per week    Types:  Marijuana    Comment: +THC  . Sexual activity: Not Currently    Birth control/protection: None  Lifestyle  . Physical activity:    Days per week: Not on file    Minutes per session: Not on file  . Stress: Not on file  Relationships  . Social connections:    Talks on phone: Not on file    Gets together: Not on file    Attends religious service: Not on file    Active member of club or organization: Not on file    Attends meetings of clubs or organizations: Not on file    Relationship status: Not on file  Other Topics Concern  . Not on file  Social History Narrative   Pt is unemployed.  She stated that she [place] in [place].  Due to altered mental status, difficult to obtain history.  Per previous history, Pt lives with her parents.   Additional Social History:                         Sleep: Good  Appetite:  Good  Current Medications: Current Facility-Administered Medications  Medication Dose Route Frequency Provider Last Rate Last Dose  . acetaminophen (TYLENOL) tablet 650 mg  650 mg Oral Q6H PRN Maryagnes AmosStarkes-Perry, Takia S, FNP      .  diphenhydrAMINE (BENADRYL) capsule 25 mg  25 mg Oral BID Malvin Johns, MD   25 mg at 05/28/18 0730  . diphenhydrAMINE (BENADRYL) capsule 50 mg  50 mg Oral Q6H PRN Maryagnes Amos, FNP   50 mg at 05/21/18 0805  . fluPHENAZine (PROLIXIN) tablet 10 mg  10 mg Oral TID Antonieta Pert, MD   10 mg at 05/28/18 0730  . haloperidol (HALDOL) tablet 5 mg  5 mg Oral BID Maryagnes Amos, FNP   5 mg at 05/28/18 0730   Or  . haloperidol lactate (HALDOL) injection 10 mg  10 mg Intramuscular BID Maryagnes Amos, FNP      . hydrOXYzine (ATARAX/VISTARIL) tablet 50 mg  50 mg Oral Q4H PRN Malvin Johns, MD   50 mg at 05/27/18 1147  . lithium carbonate (ESKALITH) CR tablet 450 mg  450 mg Oral Q12H Antonieta Pert, MD   450 mg at 05/28/18 0730  . nicotine polacrilex (NICORETTE) gum 2 mg  2 mg Oral PRN Cobos, Rockey Situ, MD   2 mg  at 05/21/18 1913  . OLANZapine zydis (ZYPREXA) disintegrating tablet 5 mg  5 mg Oral Q8H PRN Cobos, Rockey Situ, MD   5 mg at 05/24/18 0130   And  . ziprasidone (GEODON) injection 20 mg  20 mg Intramuscular PRN Cobos, Rockey Situ, MD      . traZODone (DESYREL) tablet 100 mg  100 mg Oral QHS [name] A, NP   100 mg at 05/23/18 2005    Lab Results:  Results for orders placed or performed during the hospital encounter of 05/18/18 (from the past 48 hour(s))  Lithium level     Status: Abnormal   Collection Time: 05/27/18  6:31 AM  Result Value Ref Range   Lithium Lvl 0.36 (L) 0.60 - 1.20 mmol/L    Comment: Performed at [facility], [address], [place], [place] [address]    Blood Alcohol level:  Lab Results  Component Value Date   ETH <10 05/17/2018   ETH <10 05/16/2018    Metabolic Disorder Labs: Lab Results  Component Value Date   HGBA1C 4.8 05/19/2018   MPG 91.06 05/19/2018   MPG 91 10/25/2016   Lab Results  Component Value Date   PROLACTIN 33.3 (H) 10/25/2016   PROLACTIN 66.9 (H) 12/03/2015   Lab Results  Component Value Date   CHOL 110 05/19/2018   TRIG 81 05/19/2018   HDL 42 05/19/2018   CHOLHDL 2.6 05/19/2018   VLDL 16 05/19/2018   LDLCALC 52 05/19/2018   LDLCALC 61 10/25/2016    Physical Findings: AIMS: Facial and Oral Movements Muscles of Facial Expression: None, normal Lips and Perioral Area: None, normal Jaw: None, normal Tongue: None, normal,Extremity Movements Upper (arms, wrists, hands, fingers): None, normal Lower (legs, knees, ankles, toes): None, normal, Trunk Movements Neck, shoulders, hips: None, normal, Overall Severity Severity of abnormal movements (highest score from questions above): None, normal Incapacitation due to abnormal movements: None, normal Patient's awareness of abnormal movements (rate only patient's report): No Awareness, Dental Status Current problems with teeth and/or dentures?: No Does patient  usually wear dentures?: No  CIWA:  CIWA-Ar Total: 0 COWS:  COWS Total Score: 0  Musculoskeletal: Strength & Muscle Tone: within normal limits Gait & Station: normal Patient leans: [name]  Psychiatric Specialty Exam: Physical Exam  ROS  Blood pressure 109/68, pulse 98, temperature 98.1 F (36.7 C), temperature source Oral, resp. rate 18, height 6\' 1"  (1.854 m), weight  87.1 kg, SpO2 100 %.Body mass index is 25.33 kg/m.  General Appearance: Casual  Eye Contact:  Fair  Speech:  Normal Rate  Volume:  Normal  Mood:  Euthymic  Affect:  Congruent  Thought Process:  Goal Directed  Orientation:  Full (Time, Place, and Person)  Thought Content:  Logical  Suicidal Thoughts:  No  Homicidal Thoughts:  No  Memory:  Immediate;   Fair  Judgement:  Good  Insight:  Good  Psychomotor Activity:  Normal  Concentration:  Concentration: Good  Recall:  Good  Fund of Knowledge:  Good  Language:  Good  Akathisia:  Negative  Handed:  Right  AIMS (if indicated):     Assets:  Resilience  ADL's:  Intact  Cognition:  WNL  Sleep:  Number of Hours: 6.25     Treatment Plan Summary: Daily contact with patient to assess and evaluate symptoms and progress in treatment, Medication management and Plan Your current antipsychotic/mood stabilizer therapy/current precautions and reality based therapy may go tomorrow discussed with parents arrange rehab  Malvin JohnsFARAH,Sherrie Marsan, MD 05/28/2018, 10:17 AM

## 2018-05-28 NOTE — Progress Notes (Signed)
Dar Note: Patient is in bed with eyes closed.  Respiration is even and unlabored.  Routine safety checks maintained every 15 minutes.  Patient is safe on the unit.  No behavioral issues noted.

## 2018-05-28 NOTE — Progress Notes (Signed)
CSW and patient spoke at patient request regarding anticipated discharge tomorrow. Per patient request, a [facility] residential referral has been made for patient. CSW still waiting for follow up.   Patient agreeable to follow up with her ACT Team, [facility] (Child psychotherapist, John). CSW contacted Jonny Ruiz, made aware of possible discharge tomorrow back to parent's home and upcoming court-date for guardianship. John Adult nurse; will follow up with patient at home tomorrow or Thursday.  Patient reports her plan for tomorrow is to be picked up by her mother and go to her parents home. She will follow up with her ACTT and possibly [facility] depending upon her acceptance.    Enid Cutter, LCSW-A Clinical Social Worker

## 2018-05-29 ENCOUNTER — Emergency Department (HOSPITAL_COMMUNITY)
Admission: EM | Admit: 2018-05-29 | Discharge: 2018-05-30 | Disposition: A | Discharge status: Home / Self Care | Payer: Self-pay | Attending: Emergency Medicine | Admitting: Emergency Medicine

## 2018-05-29 DIAGNOSIS — F22 Delusional disorders: Secondary | ICD-10-CM

## 2018-05-29 DIAGNOSIS — F17210 Nicotine dependence, cigarettes, uncomplicated: Secondary | ICD-10-CM | POA: Insufficient documentation

## 2018-05-29 DIAGNOSIS — F209 Schizophrenia, unspecified: Secondary | ICD-10-CM | POA: Insufficient documentation

## 2018-05-29 MED ORDER — AMANTADINE HCL 100 MG PO CAPS: 100.0000 mg | ORAL_CAPSULE | Freq: Two times a day (BID) | ORAL | 0 refills | Status: DC

## 2018-05-29 MED ORDER — AMANTADINE HCL 100 MG PO CAPS
100.0000 mg | ORAL_CAPSULE | Freq: Two times a day (BID) | ORAL | Status: DC
  Filled 2018-05-29 (×2): qty 14

## 2018-05-29 MED ORDER — LITHIUM CARBONATE ER 450 MG PO TBCR: 450.0000 mg | EXTENDED_RELEASE_TABLET | Freq: Two times a day (BID) | ORAL | 2 refills | Status: DC

## 2018-05-29 MED ORDER — FLUPHENAZINE HCL 10 MG PO TABS: 10.0000 mg | ORAL_TABLET | Freq: Three times a day (TID) | ORAL | 2 refills | Status: DC

## 2018-05-29 NOTE — Discharge Summary (Signed)
Physician Discharge Summary Note  Patient:  Amy [name] is an 23 y.o., female MRN:  [id number] DOB:  Sep 12, 1995 Patient phone:  [phone] (home)  Patient address:   [address] [place] [address],  Total Time spent with patient: 45 minutes  Date of Admission:  05/18/2018 Date of Discharge: 05/29/18  Reason for Admission:    Ms. Amy [name] is a 23 year old patient who is well-known to various psychiatric facilities [place], she has a long history of polysubstance abuse and presented once again with a drug screen positive for cannabis, she also carries a diagnosis of schizoaffective disorder, bipolar type.  This admission was prompted by a cluster of symptoms requiring petition for involuntary commitment-she presented with disorganized thought and behavior, delusional statements and bizarre statements, and noncompliance with her outpatient medications. With her permission we communicated with family through her hospital stay. Parents were interested in obtaining guardianship due to the patient's chronic noncompliance chronic substance abuse, drug-induced psychosis, that is now become a permanent type condition.  Principal Problem: Schizoaffective psychosis (HCC) Discharge Diagnoses: Principal Problem:   Schizoaffective psychosis (HCC)   Past Psychiatric History: Patient has a pattern of clearing cognitively when drugs of abuse have been cleared from her system however during this admission she remains psychotic and delusional, and did not respond to Zyprexa, and this was atypical indicating more permanent component to her psychosis  Past Medical History:  Past Medical History:  Diagnosis Date  . Cannabis abuse   . Depression   . 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 Schizo affective schizophrenia (HCC)    History reviewed. No pertinent surgical history. Family History:  Family History  Problem Relation Age of Onset  . Mental illness Neg Hx   Social History:  Social  History   Substance and Sexual Activity  Alcohol Use Not Currently     Social History   Substance and Sexual Activity  Drug Use Yes  . Frequency: 7.0 times per week  . Types: Marijuana   Comment: +THC    Social History   Socioeconomic History  . Marital status: Single    Spouse name: Not on file  . Number of children: Not on file  . Years of education: 19  . Highest education level: 12th grade  Occupational History  . Occupation: Unemployed  Social Needs  . Financial resource strain: Not on file  . Food insecurity:    Worry: Not on file    Inability: Not on file  . Transportation needs:    Medical: Not on file    Non-medical: Not on file  Tobacco Use  . Smoking status: Current Every Day Smoker    Packs/day: 0.50    Types: Cigarettes  . Smokeless tobacco: Never Used  Substance and Sexual Activity  . Alcohol use: Not Currently  . Drug use: Yes    Frequency: 7.0 times per week    Types: Marijuana    Comment: +THC  . Sexual activity: Not Currently    Birth control/protection: None  Lifestyle  . Physical activity:    Days per week: Not on file    Minutes per session: Not on file  . Stress: Not on file  Relationships  . Social connections:    Talks on phone: Not on file    Gets together: Not on file    Attends religious service: Not on file    Active member of club or organization: Not on file    Attends meetings of clubs or organizations:  Not on file    Relationship status: Not on file  Other Topics Concern  . Not on file  Social History Narrative   Pt is unemployed.  She stated that she lives in [place].  Due to altered mental status, difficult to obtain history.  Per previous history, Pt lives with her parents.    Hospital Course:    As discussed patient was admitted involuntarily, she was initially started on Zyprexa but failed to respond adequately continue to have delusional believes and behavioral outburst and at times was agitating towards  other patients. She was switched to Prolixin and her lithium was continued however this was successful but we did recommend discontinuation of long-acting injectable aripiprazole.  The presence of lithium heightens the risk of neuroleptic malignant syndrome particularly in the presence of 2 antipsychotics but she did respond well to the Prolixin. She was in agreement that her parents get guardianship she understood her inability to stay drug-free, she understood finally the need for rehab and did agree to rehab services as well as participation with her act team and [facility]. At the point of discharge she is on the lithium, Prolixin at high dose but it is necessary, and we added amantadine  Physical Findings: AIMS: Facial and Oral Movements Muscles of Facial Expression: None, normal Lips and Perioral Area: None, normal Jaw: None, normal Tongue: None, normal,Extremity Movements Upper (arms, wrists, hands, fingers): None, normal Lower (legs, knees, ankles, toes): None, normal, Trunk Movements Neck, shoulders, hips: None, normal, Overall Severity Severity of abnormal movements (highest score from questions above): None, normal Incapacitation due to abnormal movements: None, normal Patient's awareness of abnormal movements (rate only patient's report): No Awareness, Dental Status Current problems with teeth and/or dentures?: No Does patient usually wear dentures?: No  CIWA:  CIWA-Ar Total: 0 COWS:  COWS Total Score: 0  Musculoskeletal: Strength & Muscle Tone: within normal limits Gait & Station: normal Psychiatric Specialty Exam: Physical Exam  ROS  Blood pressure 113/63, pulse (!) 113, temperature 98.2 F (36.8 C), temperature source Oral, resp. rate 18, height 6\' 1"  (1.854 m), weight 87.1 kg, SpO2 100 %.Body mass index is 25.33 kg/m.  General Appearance: Casual  Eye Contact:  Good  Speech:  Clear and Coherent  Volume:  Normal  Mood:  Euthymic  Affect:  Constricted  Thought  Process:  Goal Directed  Orientation:  Full (Time, Place, and Person)  Thought Content:  Logical  Suicidal Thoughts:  No  Homicidal Thoughts:  No  Memory:  Immediate;   Good  Judgement:  Good  Insight:  Good  Psychomotor Activity:  Normal  Concentration:  Concentration: Good  Recall:  Good  Fund of Knowledge:  Good  Language:  Good  Akathisia:  Negative  Handed:  Right  AIMS (if indicated):     Assets:  Physical Health Resilience Social Support Transportation  ADL's:  Intact  Cognition:  WNL  Sleep:  Number of Hours: 6.25     Have you used any form of tobacco in the last 30 days? (Cigarettes, Smokeless Tobacco, Cigars, and/or Pipes): Yes  Has this patient used any form of tobacco in the last 30 days? (Cigarettes, Smokeless Tobacco, Cigars, and/or Pipes) Yes, No  Blood Alcohol level:  Lab Results  Component Value Date   ETH <10 05/17/2018   ETH <10 05/16/2018    Metabolic Disorder Labs:  Lab Results  Component Value Date   HGBA1C 4.8 05/19/2018   MPG 91.06 05/19/2018   MPG 91  10/25/2016   Lab Results  Component Value Date   PROLACTIN 33.3 (H) 10/25/2016   PROLACTIN 66.9 (H) 12/03/2015   Lab Results  Component Value Date   CHOL 110 05/19/2018   TRIG 81 05/19/2018   HDL 42 05/19/2018   CHOLHDL 2.6 05/19/2018   VLDL 16 05/19/2018   LDLCALC 52 05/19/2018   LDLCALC 61 10/25/2016    See Psychiatric Specialty Exam and Suicide Risk Assessment completed by Attending Physician prior to discharge.  Discharge destination:  Home  Is patient on multiple antipsychotic therapies at discharge:  No   Has Patient had three or more failed trials of antipsychotic monotherapy by history:  No  Recommended Plan for Multiple Antipsychotic Therapies: NA   Allergies as of 05/29/2018      Reactions   Penicillins Rash   Has patient had a PCN reaction causing immediate rash, facial/tongue/throat swelling, SOB or lightheadedness with hypotension: Yes Has patient had a PCN  reaction causing severe rash involving mucus membranes or skin necrosis: No Has patient had a PCN reaction that required hospitalization: No Has patient had a PCN reaction occurring within the last 10 years: Yes If all of the above answers are "NO", then may    Cogentin [benztropine] Itching   Divalproex Sodium Other (See Comments)   Creates feelings of paranoia, some suicidal feelings, and makes her feel that "people are coming after" her Creates feelings of paranoia, some suicidal feelings, and makes her feel that "people are coming after" her   Risperidone Other (See Comments)   "Makes me cough"   Valproic Acid Other (See Comments)   Creates paranoia/Per [facility] Creates paranoia/Per [facility]      Medication List    STOP taking these medications   ARIPiprazole ER 400 MG Srer injection Commonly known as:  ABILIFY MAINTENA   ibuprofen 800 MG tablet Commonly known as:  ADVIL,MOTRIN   traZODone 100 MG tablet Commonly known as:  DESYREL     TAKE these medications     Indication  amantadine 100 MG capsule Commonly known as:  SYMMETREL Take 1 capsule (100 mg total) by mouth 2 (two) times daily for 7 days.  Indication:  Extrapyramidal Reaction caused by Medications   fluPHENAZine 10 MG tablet Commonly known as:  PROLIXIN Take 1 tablet (10 mg total) by mouth 3 (three) times daily.  Indication:  Psychosis   lithium carbonate 450 MG CR tablet Commonly known as:  ESKALITH Take 1 tablet (450 mg total) by mouth every 12 (twelve) hours. What changed:    medication strength  how much to take  Indication:  Manic-Depression      Follow-up Information    [facility] Follow up.   Contact information: [address] [place] [address] [phone]        [facility] Follow up.   Why:  Your ACT Team will follow up with you following discharge.  Contact information: [address] [place]  [address] [phone]           Follow-up recommendations:  Activity:  full  Comments: Discharge diagnosis schizoaffective bipolar type acute exacerbation with psychosis/chronic drug-induced psychosis that usually resolves however this seem to flareup and exacerbated an underlying schizoaffective condition.  SignedMalvin Johns, MD 05/29/2018, 9:11 AM

## 2018-05-29 NOTE — Tx Team (Signed)
Interdisciplinary Treatment and Diagnostic Plan Update  05/29/2018 Time of Session: 9:00am Amy [name] MRN: [id number]  Principal Diagnosis: Schizoaffective psychosis (HCC)  Secondary Diagnoses: Principal Problem:   Schizoaffective psychosis (HCC)   Current Medications:  Current Facility-Administered Medications  Medication Dose Route Frequency Provider Last Rate Last Dose  . acetaminophen (TYLENOL) tablet 650 mg  650 mg Oral Q6H PRN Maryagnes Amos, FNP      . diphenhydrAMINE (BENADRYL) capsule 25 mg  25 mg Oral BID Malvin Johns, MD   25 mg at 05/29/18 0810  . diphenhydrAMINE (BENADRYL) capsule 50 mg  50 mg Oral Q6H PRN Maryagnes Amos, FNP   50 mg at 05/21/18 0805  . fluPHENAZine (PROLIXIN) tablet 10 mg  10 mg Oral TID Antonieta Pert, MD   10 mg at 05/29/18 7893  . haloperidol (HALDOL) tablet 5 mg  5 mg Oral BID Maryagnes Amos, FNP   5 mg at 05/29/18 8101   Or  . haloperidol lactate (HALDOL) injection 10 mg  10 mg Intramuscular BID Maryagnes Amos, FNP      . hydrOXYzine (ATARAX/VISTARIL) tablet 50 mg  50 mg Oral Q4H PRN Malvin Johns, MD   50 mg at 05/27/18 1147  . lithium carbonate (ESKALITH) CR tablet 450 mg  450 mg Oral Q12H Antonieta Pert, MD   450 mg at 05/29/18 0810  . nicotine polacrilex (NICORETTE) gum 2 mg  2 mg Oral PRN Cobos, Rockey Situ, MD   2 mg at 05/21/18 1913  . OLANZapine zydis (ZYPREXA) disintegrating tablet 5 mg  5 mg Oral Q8H PRN Cobos, Rockey Situ, MD   5 mg at 05/24/18 0130   And  . ziprasidone (GEODON) injection 20 mg  20 mg Intramuscular PRN Cobos, Rockey Situ, MD      . traZODone (DESYREL) tablet 100 mg  100 mg Oral QHS [name] A, NP   100 mg at 05/23/18 2005   PTA Medications: Medications Prior to Admission  Medication Sig Dispense Refill Last Dose  . ARIPiprazole ER 400 MG SRER Inject 400 mg into the muscle every 28 (twenty-eight) days. (Due 11-28-16): For mood control (Patient not taking: Reported on 10/27/2017) 1  each 0 Not Taking at Unknown time  . ibuprofen (ADVIL,MOTRIN) 800 MG tablet Take 1 tablet (800 mg total) by mouth 2 (two) times daily. With food (Patient not taking: Reported on 11/14/2017) 12 tablet 0 Completed Course at Unknown time  . lithium carbonate (LITHOBID) 300 MG CR tablet Take 1 tablet (300 mg total) by mouth every 12 (twelve) hours. (Patient not taking: Reported on 11/02/2017) 28 tablet 0 Not Taking at Unknown time  . traZODone (DESYREL) 100 MG tablet Take 1 tablet (100 mg total) by mouth at bedtime. (Patient not taking: Reported on 11/02/2017) 14 tablet 0 Not Taking at Unknown time    Patient Stressors: Marital or family conflict Medication change or noncompliance  Patient Strengths: Ability for insight Motivation for treatment/growth  Treatment Modalities: Medication Management, Group therapy, Case management,  1 to 1 session with clinician, Psychoeducation, Recreational therapy.   Physician Treatment Plan for Primary Diagnosis: Schizoaffective psychosis (HCC) Long Term Goal(s): Improvement in symptoms so as ready for discharge Improvement in symptoms so as ready for discharge   Short Term Goals: Ability to identify changes in lifestyle to reduce recurrence of condition will improve Ability to maintain clinical measurements within normal limits will improve Ability to identify changes in lifestyle to reduce recurrence of condition will improve Ability to verbalize  feelings will improve Ability to disclose and discuss suicidal ideas Ability to demonstrate self-control will improve Ability to identify and develop effective coping behaviors will improve Ability to maintain clinical measurements within normal limits will improve  Medication Management: Evaluate patient's response, side effects, and tolerance of medication regimen.  Therapeutic Interventions: 1 to 1 sessions, Unit Group sessions and Medication administration.  Evaluation of Outcomes: Adequate for  Discharge  Physician Treatment Plan for Secondary Diagnosis: Principal Problem:   Schizoaffective psychosis (HCC)  Long Term Goal(s): Improvement in symptoms so as ready for discharge Improvement in symptoms so as ready for discharge   Short Term Goals: Ability to identify changes in lifestyle to reduce recurrence of condition will improve Ability to maintain clinical measurements within normal limits will improve Ability to identify changes in lifestyle to reduce recurrence of condition will improve Ability to verbalize feelings will improve Ability to disclose and discuss suicidal ideas Ability to demonstrate self-control will improve Ability to identify and develop effective coping behaviors will improve Ability to maintain clinical measurements within normal limits will improve     Medication Management: Evaluate patient's response, side effects, and tolerance of medication regimen.  Therapeutic Interventions: 1 to 1 sessions, Unit Group sessions and Medication administration.  Evaluation of Outcomes: Adequate for Discharge   RN Treatment Plan for Primary Diagnosis: Schizoaffective psychosis (HCC) Long Term Goal(s): Knowledge of disease and therapeutic regimen to maintain health will improve  Short Term Goals: Ability to demonstrate self-control, Ability to verbalize feelings will improve, Ability to identify and develop effective coping behaviors will improve and Compliance with prescribed medications will improve  Medication Management: RN will administer medications as ordered by provider, will assess and evaluate patient's response and provide education to patient for prescribed medication. RN will report any adverse and/or side effects to prescribing provider.  Therapeutic Interventions: 1 on 1 counseling sessions, Psychoeducation, Medication administration, Evaluate responses to treatment, Monitor vital signs and CBGs as ordered, Perform/monitor CIWA, COWS, AIMS and Fall Risk  screenings as ordered, Perform wound care treatments as ordered.  Evaluation of Outcomes: Adequate for Discharge   LCSW Treatment Plan for Primary Diagnosis: Schizoaffective psychosis (HCC) Long Term Goal(s): Safe transition to appropriate next level of care at discharge, Engage patient in therapeutic group addressing interpersonal concerns.  Short Term Goals: Increase emotional regulation, Facilitate patient progression through stages of change regarding substance use diagnoses and concerns, Identify triggers associated with mental health/substance abuse issues and Increase skills for wellness and recovery  Therapeutic Interventions: Assess for all discharge needs, 1 to 1 time with Social worker, Explore available resources and support systems, Assess for adequacy in community support network, Educate family and significant other(s) on suicide prevention, Complete Psychosocial Assessment, Interpersonal group therapy.  Evaluation of Outcomes: Adequate for Discharge   Progress in Treatment: Attending groups: No. Participating in groups: No. Taking medication as prescribed: Yes. Toleration medication: Yes. Family/Significant other contact made: No, will contact:  SPE completed with patient. Dr.Farah has spoken with patient's parents Patient understands diagnosis: Yes. Discussing patient identified problems/goals with staff: No. Medical problems stabilized or resolved: Yes. Denies suicidal/homicidal ideation: Yes. Issues/concerns per patient self-inventory: No.  New problem(s) identified: Yes, Describe:  parents have court for guardianship on 01/14  New Short Term/Long Term Goal(s): detox, medication management for mood stabilization; elimination of SI thoughts; development of comprehensive mental wellness/sobriety plan.  Patient Goals:  Discharge.  Discharge Plan or Barriers: Patient's ACT Team will follow up with patient. Patient returning home with parents. MHAG pamphlet, Mobile  Crisis information, and AA/NA information provided to patient for additional community support and resources.   Reason for Continuation of Hospitalization: Anxiety Depression  Estimated Length of Stay: discharge today  Attendees: Patient: 05/29/2018 9:31 AM  Physician: Dr.Farah 05/29/2018 9:31 AM  Nursing: Lincoln Maxinlivette, RN 05/29/2018 9:31 AM  RN Care Manager: 05/29/2018 9:31 AM  Social Worker: Enid Cutterharlotte Masako Overall, LCSWA 05/29/2018 9:31 AM  Recreational Therapist:  05/29/2018 9:31 AM  Other:  05/29/2018 9:31 AM  Other:  05/29/2018 9:31 AM  Other: 05/29/2018 9:31 AM    Scribe for Treatment Team: Darreld Mcleanharlotte C Marieke Lubke, LCSWA 05/29/2018 9:31 AM

## 2018-05-29 NOTE — [facility] Suicide Risk Assessment (Signed)
[facility]   Principal Problem: Schizoaffective psychosis (HCC) Discharge Diagnoses: Principal Problem:   Schizoaffective psychosis (HCC)   Total Time spent with patient: 45 minutes Alert and oriented now cooperative without thoughts of self-harm and contracting fully, again no acute psychosis compliant with meds  Mental Status Per Nursing Assessment::   On Admission:  NA  Demographic Factors:  Unemployed  Loss Factors: Decrease in vocational status  Historical Factors: Impulsivity  Risk Reduction Factors:   Positive therapeutic relationship  Continued Clinical Symptoms:  Alcohol/Substance Abuse/Dependencies  Cognitive Features That Contribute To Risk:  None    Suicide Risk:  Minimal: No identifiable suicidal ideation.  Patients presenting with no risk factors but with morbid ruminations; may be classified as minimal risk based on the severity of the depressive symptoms  Follow-up Information    Services, [facility] Follow up.   Contact information: [address] [place] [address] [phone]        [facility], [facility] Follow up.   Why:  Your ACT Team will follow up with you following discharge.  Contact information: [address] [place] [address] [phone]           Plan Of Care/Follow-up recommendations:  Activity:  full  Gerson Fauth, MD 05/29/2018, 9:07 AM

## 2018-05-29 NOTE — Plan of Care (Signed)
Pt was able to identify some coping skills for post d/c at conclusion of recreation therapy group sessions.   Caroll Rancher, LRT/CTRS

## 2018-05-29 NOTE — Progress Notes (Signed)
  [facility] Adult Case Management Discharge Plan :  Will you be returning to the same living situation after discharge:  Yes,  home At discharge, do you have transportation home?: Yes,  mom is picking up at 11:30am Do you have the ability to pay for your medications: Yes,  Medicaid  Release of information consent forms completed and in the chart. Letter for patient on chart.  Patient to Follow up at: Follow-up Information    [facility], [facility] Follow up.   Why:  Your ACT Team will follow up with you following discharge.  Contact information: [address] [place] [address] [phone]           Next level of care provider has access to [facility] Link:no  Safety Planning and Suicide Prevention discussed: Yes,  with patient  Have you used any form of tobacco in the last 30 days? (Cigarettes, Smokeless Tobacco, Cigars, and/or Pipes): Yes  Has patient been referred to the Quitline?: Patient refused referral  Patient has been referred for addiction treatment: Yes  Darreld Mclean, LCSWA 05/29/2018, 9:19 AM

## 2018-05-29 NOTE — Progress Notes (Signed)
Recreation Therapy Notes  INPATIENT RECREATION TR PLAN  Patient Details Name: Amy [name] MRN: [id number] DOB: [birth year] Today's Date: 05/29/2018  Rec Therapy Plan Is patient appropriate for Therapeutic Recreation?: Yes Treatment times per week: about 3 days Estimated Length of Stay: 5-7 days TR Treatment/Interventions: Group participation (Comment)  Discharge Criteria Pt will be discharged from therapy if:: Discharged Treatment plan/goals/alternatives discussed and agreed upon by:: Patient/family  Discharge Summary Short term goals set: See patient care plan Short term goals met: Adequate for discharge Progress toward goals comments: Groups attended Which groups?: Goal setting, Wellness, Coping skills Reason goals not met: None Therapeutic equipment acquired: [name] Reason patient discharged from therapy: Discharge from hospital Pt/family agrees with progress & goals achieved: Yes Date patient discharged from therapy: 05/29/18    Victorino Sparrow, LRT/CTRS   [name], Darleny Sem A 05/29/2018, 11:23 AM

## 2018-05-29 NOTE — Progress Notes (Signed)
Recreation Therapy Notes  Date: 1.8.20 Time: 1000 Location: 500 Hall Dayroom  Group Topic: Goal Setting  Goal Area(s) Addresses:  Patient will be able to identify at least 3 life goals.  Patient will be able to identify obstacles to reaching goals.  Patient will be able to identify benefit of setting life goals post d/c.   Behavioral Response:  Engaged  Intervention: Worksheet  Activity: Garment/textile technologist.  Patients were to set goals they want to accomplish within the next week, month, year and five years.  Patients were to also identify obstacles that would hinder reaching goals, what they need to reach goals and what they could start doing today towards their goals.  Education:  Discharge Planning, Coping Skills, Goal Planning   Education Outcome: Acknowledges Education/In Group Clarification Provided/Needs Additional Education  Clinical Observations:  Patient stated she wanted to make sure her coping skills were straight within the next week, get a job in a month, get a car in a year and start a family in five years.  Pt stated her obstacles were getting up on time.  Patient needs transportation, a husband and money to reach her goals.  Pt stated she can start today by going on job outlook and using [email or web address].    Caroll Rancher, LRT/CTRS     Caroll Rancher A 05/29/2018 10:55 AM

## 2018-05-29 NOTE — Therapy (Signed)
Occupational Therapy Group Note  Date:  05/29/2018 Time:  12:03 PM  Group Topic/Focus:  Stress Management  Participation Level:  Active  Participation Quality:  Appropriate  Affect:  Flat  Cognitive:  Appropriate  Insight: Improving  Engagement in Group:  Engaged  Modes of Intervention:  Activity, Discussion, Education and Socialization  Additional Comments:    S: "You can rely on friends/ family when you are stressed"  O: Education given on stress management and healthy coping mechanisms. Pt encouraged to brainstorm with other peers and discuss what has worked in the past vs what has not. Pts further encouraged to discuss new coping stress management strategies to implement this date. Art activity made to display preferred coping mechanisms, along with incorporating the stress management outlet of coloring/art. PMR script delivered to facilitate relaxation response to help significant engagement in BADL/IADL.   A: Pt presents to group with flat affect, affect improved when discussing preferred activities. Pt brainstormed activities with other peers, stating social support, exercise, and dancing are good stress relieving activities. Pt engaged in art activity with success, noted improved affect.   P: Pt provided with education on stress management activities to implement into daily routine. Handouts given to facilitate carryover when reintegrating into community   Dalphine Handing, MSOT, OTR/L [employer] OT/ Acute Relief OT PHP Office: [phone]  Dalphine Handing 05/29/2018, 12:03 PM

## 2018-05-29 NOTE — Progress Notes (Addendum)
[facility] was [facility] and [facility] due to acute mental health and behavioral concerns. CSW updated patient, patient voices understanding. Patient's ACT Team set to follow up with patient.  Patient reports her mother is unable to pick her up as planned, says dad is disabled and cannot drive. Patient has not granted CSW permission to contact parents to verify. Patient requesting 1 [place] bus pass and 1 [place] bus pass and to leave at scheduled time at 11:30am. CSW to leave bus passes on chart for patient.  Enid Cutter, LCSW-A Clinical Social Worker

## 2018-05-29 NOTE — Progress Notes (Signed)
Patient ID: Amy [name], female   DOB: [birth year], 23 y.o.   MRN: [id number]  D: Pt alert and oriented on the unit.   A: Education, support, and encouragement provided. Discharge summary, medications and follow up appointments reviewed with pt. Suicide prevention resources provided, including "My 3 App." Pt's belongings in locker # 14 returned and belongings sheet signed.  R: Pt denies SI/HI, A/VH, pain, or any concerns at this time. Pt ambulatory on and off unit. Pt discharged to lobby.

## 2018-05-30 ENCOUNTER — Encounter (HOSPITAL_COMMUNITY): Payer: Self-pay | Admitting: Emergency Medicine

## 2018-05-30 LAB — COMPREHENSIVE METABOLIC PANEL
ALT: 31 U/L (ref 0–44)
AST: 21 U/L (ref 15–41)
Albumin: 3.8 g/dL (ref 3.5–5.0)
Alkaline Phosphatase: 40 U/L (ref 38–126)
Anion gap: 9 (ref 5–15)
BUN: 11 mg/dL (ref 6–20)
CO2: 22 mmol/L (ref 22–32)
Calcium: 9.1 mg/dL (ref 8.9–10.3)
Chloride: 108 mmol/L (ref 98–111)
Creatinine, Ser: 0.93 mg/dL (ref 0.44–1.00)
GFR calc Af Amer: >60 mL/min (ref >60)
GFR calc non Af Amer: >60 mL/min (ref >60)
GLUCOSE: 100 mg/dL — AB (ref 70–99)
Potassium: 4.1 mmol/L (ref 3.5–5.1)
Sodium: 139 mmol/L (ref 135–145)
Total Bilirubin: 0.5 mg/dL (ref 0.3–1.2)
Total Protein: 6.9 g/dL (ref 6.5–8.1)

## 2018-05-30 LAB — CBC
HCT: 38.8 % (ref 36.0–46.0)
HEMOGLOBIN: 12.8 g/dL (ref 12.0–15.0)
MCH: 33.1 pg (ref 26.0–34.0)
MCHC: 33 g/dL (ref 30.0–36.0)
MCV: 100.3 fL — ABNORMAL HIGH (ref 80.0–100.0)
Platelets: 274 10*3/uL (ref 150–400)
RBC: 3.87 MIL/uL (ref 3.87–5.11)
RDW: 13.3 % (ref 11.5–15.5)
WBC: 10 10*3/uL (ref 4.0–10.5)
nRBC: 0 % (ref 0.0–0.2)

## 2018-05-30 LAB — RAPID URINE DRUG SCREEN, HOSP PERFORMED
Amphetamines: NOT DETECTED
Barbiturates: NOT DETECTED
Benzodiazepines: NOT DETECTED
Cocaine: NOT DETECTED
OPIATES: NOT DETECTED
Tetrahydrocannabinol: POSITIVE — AB

## 2018-05-30 LAB — I-STAT BETA HCG BLOOD, ED (MC, WL, AP ONLY): I-stat hCG, quantitative: <5 m[IU]/mL (ref <5)

## 2018-05-30 LAB — SALICYLATE LEVEL: Salicylate Lvl: <7 mg/dL (ref 2.8–30.0)

## 2018-05-30 LAB — LITHIUM LEVEL: Lithium Lvl: 0.54 mmol/L — ABNORMAL LOW (ref 0.60–1.20)

## 2018-05-30 LAB — ACETAMINOPHEN LEVEL

## 2018-05-30 LAB — ETHANOL: Alcohol, Ethyl (B): <10 mg/dL (ref <10)

## 2018-05-30 MED ORDER — NICOTINE 14 MG/24HR TD PT24
14.0000 mg | MEDICATED_PATCH | Freq: Every day | TRANSDERMAL | Status: DC
  Administered 2018-05-30: 14 mg via TRANSDERMAL
  Filled 2018-05-30: qty 1

## 2018-05-30 MED ORDER — IBUPROFEN 400 MG PO TABS: 600.0000 mg | ORAL_TABLET | Freq: Three times a day (TID) | ORAL | Status: DC | PRN

## 2018-05-30 MED ORDER — ALUM & MAG HYDROXIDE-SIMETH 200-200-20 MG/5ML PO SUSP: 30.0000 mL | Freq: Four times a day (QID) | ORAL | Status: DC | PRN

## 2018-05-30 MED ORDER — LITHIUM CARBONATE ER 450 MG PO TBCR
450.0000 mg | EXTENDED_RELEASE_TABLET | Freq: Two times a day (BID) | ORAL | Status: DC
  Administered 2018-05-30: 450 mg via ORAL
  Filled 2018-05-30: qty 1

## 2018-05-30 MED ORDER — AMANTADINE HCL 100 MG PO CAPS
100.0000 mg | ORAL_CAPSULE | Freq: Two times a day (BID) | ORAL | Status: DC
  Administered 2018-05-30: 100 mg via ORAL
  Filled 2018-05-30: qty 1

## 2018-05-30 MED ORDER — FLUPHENAZINE HCL 5 MG PO TABS
10.0000 mg | ORAL_TABLET | Freq: Three times a day (TID) | ORAL | Status: DC
  Administered 2018-05-30: 10 mg via ORAL
  Filled 2018-05-30: qty 2
  Filled 2018-05-30 (×2): qty 1
  Filled 2018-05-30: qty 2
  Filled 2018-05-30: qty 1

## 2018-05-30 MED ORDER — ACETAMINOPHEN 325 MG PO TABS: 650.0000 mg | ORAL_TABLET | Freq: Four times a day (QID) | ORAL | Status: DC | PRN

## 2018-05-30 MED ORDER — ONDANSETRON HCL 4 MG PO TABS: 4.0000 mg | ORAL_TABLET | Freq: Three times a day (TID) | ORAL | Status: DC | PRN

## 2018-05-30 NOTE — Consult Note (Signed)
1500:  Spoke with Nehemiah SettleBrooke, RN informed she had to transfer patient unable to set up tele psych at this time.  Informed to call main number and ask to speak with Tresa EndoKelly, RN that she is taking over for and have set up tele assessment; called transferred no answer.  Will attempt tele assessment at later time.    Tele Assessment   Amy [name], 23 y.o., female [facility] but the medications were not working and that she continued to have psychosis and paranoia.  It [facility]; UDS continue to be positive for THC.   [facility] (Dr. Malvin JohnsBrian Farah) Reason for Admission:   Amy [name] is a 23 year old patient who is well-known to various psychiatric facilities [place], she has a long history of polysubstance abuse and presented once again with a drug screen positive for cannabis, she also carries a diagnosis of schizoaffective disorder, bipolar type.  This admission was prompted by a cluster of symptoms requiring petition for involuntary commitment-she presented with disorganized thought and behavior, delusional statements and bizarre statements, and noncompliance with her outpatient medications. With her permission we communicated with family through her hospital stay. Parents were interested in obtaining guardianship due to the patient's chronic noncompliance chronic substance abuse, drug-induced psychosis, that is now become a permanent type condition. Past Psychiatric History:  Patient has a pattern of clearing cognitively when drugs of abuse have been cleared from her system however during this admission she remains psychotic and delusional, and did not respond to Zyprexa, and this was atypical indicating more permanent component to her psychosis Hospital Course:   As discussed patient was admitted involuntarily, she was initially started on Zyprexa but failed to  respond adequately continue to have delusional believes and behavioral outburst and at times was agitating towards other patients. She was switched to Prolixin and her lithium was continued however this was successful but we did recommend discontinuation of long-acting injectable aripiprazole.  The presence of lithium heightens the risk of neuroleptic malignant syndrome particularly in the presence of 2 antipsychotics but she did respond well to the Prolixin. She was in agreement that her parents get guardianship she understood her inability to stay drug-free, she understood finally the need for rehab and did agree to rehab services as well as participation with her act team and [facility]. At the point of discharge she is on the lithium, Prolixin at high dose but it is necessary, and we added amantadine Patient [facility] with ACT team was to follow up with patient following discharge.    Patient seen via telepsych by this provider; chart reviewed and consulted with Dr. Lucianne MussKumar on 05/30/18.  On evaluation Amy [name] reports that she went to ED "I just wanted to have my medicine adjusted.  I don't want to go up stairs or anything."  Discussed with patient that adjustments just made on medications and to soon to make more adjustments that she would need to follow up with [facility]."  At this time patient denies suicidal/self-harm/homicidal ideation, psychosis, and paranoia.  Patient states that she also has ACT team through [facility] and asked if we could call to see if they could pick her up to take home.     During evaluation Amy [name] is sitting on side of bed;  she is alert/oriented x 4; calm/cooperative; and mood congruent with affect.  Patient is speaking in a clear tone at moderate volume, and normal pace; with good eye contact.  Her thought process is coherent and relevant; There is no indication that she is currently  responding to internal/external stimuli or experiencing delusional thought content.  Patient denies suicidal/self-harm/homicidal ideation, psychosis, and paranoia.  Patient has remained calm throughout assessment and has answered questions appropriately.   Recommendations:  [facility] and with [facility] (ACT team)  Call to see if able to pick up patient or family member to pick up.  Disposition: No evidence of imminent risk to self or others at present.   Patient does not meet criteria for psychiatric inpatient admission. Supportive therapy provided about ongoing stressors. Discussed crisis plan, support from social network, calling 911, coming to the Emergency Department, and calling Suicide Hotline.   Spoke with Dr. Ranae PalmsYelverton; informed of above recommendation and disposition   Assunta FoundShuvon Nija Koopman, NP

## 2018-05-30 NOTE — ED Notes (Signed)
Patient verbalized understanding of discharge instructions and denies any further needs or questions at this time. VS stable. Patient ambulatory with steady gait.  

## 2018-05-30 NOTE — ED Triage Notes (Signed)
Pt reports she was discharged from Behavioral health today, "the medications are not working...  I have having psychosis and paranoia, people are out to get me...  The new medication is causing this."  Pt took all her night time medications and appears very sleepy.

## 2018-05-30 NOTE — BH Assessment (Signed)
Tele Assessment Note   Patient Name: Amy [name] MRN: [id number] Referring Physician: Dr. Preston FleetingGlick Location of Patient: [facility] Location of Provider: Behavioral Health TTS Department  Amy [name] is an 23 y.o. female [facility] stating that she is paranoid and feels like people are out to get her.  Patient states that she also feels like she cannot trust her parents.  Patient is diagnosed with schizophrenia and [facility] and discharged yesterday.  Patient states that she feels like she is having an adverse reaction to her medications and states that she came to the ED to get her medicines changed.  Patient denies any current SI/HI.  Patient does admit to smoking marijuana yesterday, but states that she only smoked 1/2 blunt.  Patient states that her sleep and appetite have been good and she is not having any problems maintaining them.  When asked if she felt like she needed to be in the hospital, patient stated, "no."  She is currently residing with her family and states that they are supportive and she is followed by an Investment banker, operationalACTT Team [facility].  Patient states, "do you think I can have a bus pass to get back to [facility]?"  Patient presented as alert and oriented, her mood and affect are appropriate to her current situation.  Her thoughts are organized and her memory is intact.  Her judgment, insight and impulse control were partially impaired.  Her speech was clear and coherent and her eye contact was good.  Patient did not appear to be responding to internal stimuli.  Her psycho-motor activity was normal. Patient was able to contract for safety outside of the hospital.   Diagnosis: F20.9 Schizophrenia  Past Medical History:  Past Medical History:  Diagnosis Date  . Cannabis abuse   . Depression   . 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 Schizo affective schizophrenia (HCC)     History reviewed. No pertinent surgical history.  Family  History:  Family History  Problem Relation Age of Onset  . Mental illness Neg Hx     Social History:  reports that she has been smoking cigarettes. She has been smoking about 0.50 packs per day. She has never used smokeless tobacco. She reports previous alcohol use. She reports current drug use. Frequency: 7.00 times per week. Drug: Marijuana.  Additional Social History:  Alcohol / Drug Use Pain Medications: see MAR Prescriptions: see MAR Over the Counter: see MAR History of alcohol / drug use?: No history of alcohol / drug abuse Longest period of sobriety (when/how long): none reported Substance #1 Name of Substance 1: Marijuana 1 - Age of First Use: unknown 1 - Amount (size/oz): 1/2 blunt yesterday 1 - Frequency: daily 1 - Duration: since onset 1 - Last Use / Amount: yesterday  CIWA: CIWA-Ar BP: 101/60 Pulse Rate: (!) 48 COWS:    Allergies:  Allergies  Allergen Reactions  . Penicillins Rash    Has patient had a PCN reaction causing immediate rash, facial/tongue/throat swelling, SOB or lightheadedness with hypotension: Yes Has patient had a PCN reaction causing severe rash involving mucus membranes or skin necrosis: No Has patient had a PCN reaction that required hospitalization: No Has patient had a PCN reaction occurring within the last 10 years: Yes If all of the above answers are "NO", then may   . Cogentin [Benztropine] Itching  . Divalproex Sodium Other (See Comments)    Creates feelings of paranoia,  some suicidal feelings, and makes her feel that "people are coming after" her Creates feelings of paranoia, some suicidal feelings, and makes her feel that "people are coming after" her  . Risperidone Other (See Comments)    "Makes me cough"  . Valproic Acid Other (See Comments)    Creates paranoia/Per [facility] Creates paranoia/Per [facility]    Home Medications: (Not in a hospital admission)   OB/GYN Status:  No LMP recorded (lmp unknown).  General  Assessment Data Location of Assessment: [facility] ED TTS Assessment: In system Is this a Tele or Face-to-Face Assessment?: Tele Assessment Is this an Initial Assessment or a Re-assessment for this encounter?: Initial Assessment Patient Accompanied by:: [name] Language Other than English: No Living Arrangements: Other (Comment)(lives with parents) What gender do you identify as?: Female Marital status: Single Maiden name: Dickens Pregnancy Status: No Living Arrangements: Parent Can pt return to current living arrangement?: Yes Admission Status: Voluntary Is patient capable of signing voluntary admission?: Yes Referral Source: Self/Family/Friend Insurance type: Medicaid     Crisis Care Plan Living Arrangements: Parent Legal Guardian: Other:(self) Name of Psychiatrist: Dr. Jeannine Kitten Name of Therapist: [facility]  Education Status Is patient currently in school?: No Is the patient employed, unemployed or receiving disability?: Unemployed  Risk to self with the past 6 months Suicidal Ideation: No Has patient been a risk to self within the past 6 months prior to admission? : No Suicidal Intent: No Has patient had any suicidal intent within the past 6 months prior to admission? : No Is patient at risk for suicide?: No Suicidal Plan?: No Has patient had any suicidal plan within the past 6 months prior to admission? : No Access to Means: No What has been your use of drugs/alcohol within the last 12 months?: daily THC use Previous Attempts/Gestures: No How many times?: 0 Other Self Harm Risks: (none reported) Triggers for Past Attempts: None known Intentional Self Injurious Behavior: None Family Suicide History: No Recent stressful life event(s): Other (Comment)(none reported) Persecutory voices/beliefs?: No Depression: No Substance abuse history and/or treatment for substance abuse?: Yes Suicide prevention information given to non-admitted patients: Not applicable  Risk to Others  within the past 6 months Homicidal Ideation: No Does patient have any lifetime risk of violence toward others beyond the six months prior to admission? : No Thoughts of Harm to Others: No Current Homicidal Intent: No Current Homicidal Plan: No-Not Currently/Within Last 6 Months Access to Homicidal Means: No Identified Victim: none History of harm to others?: No Assessment of Violence: None Noted Violent Behavior Description: none Does patient have access to weapons?: No Criminal Charges Pending?: No Does patient have a court date: No Is patient on probation?: No  Psychosis Hallucinations: None noted Delusions: (thinks people are out to get her, does not trust parnets)  Mental Status Report Appearance/Hygiene: Unremarkable Eye Contact: Good Motor Activity: Unremarkable Speech: Logical/coherent Level of Consciousness: Alert Mood: Pleasant Affect: Appropriate to circumstance Anxiety Level: None Thought Processes: Coherent, Relevant Judgement: Partial Orientation: Person, Place, Time, Situation Obsessive Compulsive Thoughts/Behaviors: None  Cognitive Functioning Concentration: Normal Memory: Recent Intact, Remote Intact Is patient IDD: No Insight: Fair Impulse Control: Fair Appetite: Good Have you had any weight changes? : No Change Sleep: No Change Total Hours of Sleep: 8  ADLScreening [facility] Assessment Services) Patient's cognitive ability adequate to safely complete daily activities?: Yes Patient able to express need for assistance with ADLs?: Yes Independently performs ADLs?: Yes (appropriate for developmental age)  Prior Inpatient Therapy  Prior Inpatient Therapy: Yes Prior Therapy Dates: 2020 [facility] Reason for Treatment: Schizophrenia  Prior Outpatient Therapy Prior Outpatient Therapy: Yes Prior Therapy Dates: active Prior Therapy Facilty/Provider(s): [facility] Reason for Treatment: schizophrenia Does patient have an  ACCT team?: Yes Does patient have Intensive In-House Services?  : No Does patient have [facility] services? : No Does patient have P4CC services?: No  ADL Screening (condition at time of admission) Patient's cognitive ability adequate to safely complete daily activities?: Yes Is the patient deaf or have difficulty hearing?: No Does the patient have difficulty seeing, even when wearing glasses/contacts?: No Does the patient have difficulty concentrating, remembering, or making decisions?: No Patient able to express need for assistance with ADLs?: Yes Does the patient have difficulty dressing or bathing?: No Independently performs ADLs?: Yes (appropriate for developmental age) Weakness of Legs: None Weakness of Arms/Hands: None  Home Assistive Devices/Equipment Home Assistive Devices/Equipment: None  Therapy Consults (therapy consults require a physician order) PT Evaluation Needed: No OT Evalulation Needed: No SLP Evaluation Needed: No Abuse/Neglect Assessment (Assessment to be complete while patient is alone) Abuse/Neglect Assessment Can Be Completed: Yes Physical Abuse: Denies Verbal Abuse: Denies Sexual Abuse: Denies Exploitation of patient/patient's resources: Denies Self-Neglect: Denies Values / Beliefs Cultural Requests During Hospitalization: None Spiritual Requests During Hospitalization: None Consults Spiritual Care Consult Needed: No Social Work Consult Needed: No Merchant navy officerAdvance Directives (For Healthcare) Does Patient Have a Medical Advance Directive?: No Would patient like information on creating a medical advance directive?: No - Patient declined Nutrition Screen- [facility] Adult/[facility] Has the patient recently lost weight without trying?: No Has the patient been eating poorly because of a decreased appetite?: No Malnutrition Screening Tool Score: 0        Disposition: Amy [name] has psych cleared patient for discharge to follow-up with [facility] and  [facility]. Disposition Initial Assessment Completed for this Encounter: Yes  This service was provided via telemedicine using a 2-way, interactive audio and video technology.  Names of all persons participating in this telemedicine service and their role in this encounter. Name: Johnnette Barriosdantre [name] Role: patient  Name: Dorethia Jeanmarie Role: TTS  Name:  Role:   Name:  Role:     Daphene CalamityDanny J Drayton Tieu 05/30/2018 8:22 AM

## 2018-05-30 NOTE — ED Provider Notes (Signed)
[facility] EMERGENCY DEPARTMENT Provider Note   CSN: [id number] Arrival date & time: 05/29/18  2342     History   Chief Complaint Chief Complaint  Patient presents with  . Psychiatric Evaluation    HPI Amy [name] is a 23 y.o. female.  The history is provided by the patient.  She has a history of schizoaffective schizophrenia and substance abuse and comes in because of paranoid thoughts.  She states that she was discharged from [facility] and thinks that she is having a reaction to medication that she was given.  She states that she feels paranoid, that people were around her trying to get her.  She has had similar problems in the past.  She denies hallucinations.  Past Medical History:  Diagnosis Date  . Cannabis abuse   . Depression   . 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 Schizo affective schizophrenia [facility])     Patient Active Problem List   Diagnosis Date Noted  . Schizoaffective psychosis (HCC) 05/18/2018  . 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 Substance induced mood disorder (HCC) 11/14/2017  . 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 Substance abuse (HCC)   . Cocaine abuse with cocaine-induced mood disorder (HCC) 12/16/2016  . Bipolar disorder, curr episode mixed, severe, with psychotic features (HCC) 10/25/2016  . Cocaine use disorder, mild, abuse (HCC) 10/25/2016  . Cannabis use disorder, severe, dependence (HCC) 10/25/2016  . Insomnia   . Anxiety state   . Cocaine abuse (HCC) 07/04/2015  . Cannabis abuse, continuous 07/02/2015  . Non compliance with medical treatment 06/18/2015    History reviewed. No pertinent surgical history.   OB History   No obstetric history on file.      Home Medications    Prior to Admission medications   Medication Sig Start Date End Date Taking? Authorizing Provider  amantadine (SYMMETREL) 100 MG capsule Take 1 capsule (100 mg total) by mouth  2 (two) times daily for 7 days. 05/29/18 06/05/18  Malvin Johns, MD  fluPHENAZine (PROLIXIN) 10 MG tablet Take 1 tablet (10 mg total) by mouth 3 (three) times daily. 05/29/18   Malvin Johns, MD  lithium carbonate (ESKALITH) 450 MG CR tablet Take 1 tablet (450 mg total) by mouth every 12 (twelve) hours. 05/29/18   Malvin Johns, MD    Family History Family History  Problem Relation Age of Onset  . Mental illness Neg Hx     Social History Social History   Tobacco Use  . Smoking status: Current Every Day Smoker    Packs/day: 0.50    Types: Cigarettes  . Smokeless tobacco: Never Used  Substance Use Topics  . Alcohol use: Not Currently  . Drug use: Yes    Frequency: 7.0 times per week    Types: Marijuana    Comment: +THC     Allergies   Penicillins; Cogentin [benztropine]; Divalproex sodium; Risperidone; and Valproic acid   Review of Systems Review of Systems  All other systems reviewed and are negative.    Physical Exam Updated Vital Signs BP 130/73 (BP Location: Right Arm)   Pulse 84   Temp 98 F (36.7 C) (Oral)   Resp 18   LMP  (LMP Unknown)   SpO2 98%   Physical Exam Vitals signs and nursing note reviewed.    23 year old female, resting comfortably and in no acute distress. Vital signs are normal. Oxygen saturation is 98%, which is normal. Head is  normocephalic and atraumatic. PERRLA, EOMI. Oropharynx is clear. Neck is nontender and supple without adenopathy or JVD. Back is nontender and there is no CVA tenderness. Lungs are clear without rales, wheezes, or rhonchi. Chest is nontender. Heart has regular rate and rhythm without murmur. Abdomen is soft, flat, nontender without masses or hepatosplenomegaly and peristalsis is normoactive. Extremities have no cyanosis or edema, full range of motion is present. Skin is warm and dry without rash. Neurologic: Mental status is normal, cranial nerves are intact, there are no motor or sensory deficits.  ED Treatments /  Results  Labs (all labs ordered are listed, but only abnormal results are displayed) Labs Reviewed  COMPREHENSIVE METABOLIC PANEL - Abnormal; Notable for the following components:      Result Value   Glucose, Bld 100 (*)    All other components within normal limits  ACETAMINOPHEN LEVEL - Abnormal; Notable for the following components:   Acetaminophen (Tylenol), Serum <10 (*)    All other components within normal limits  CBC - Abnormal; Notable for the following components:   MCV 100.3 (*)    All other components within normal limits  RAPID URINE DRUG SCREEN, HOSP PERFORMED - Abnormal; Notable for the following components:   Tetrahydrocannabinol POSITIVE (*)    All other components within normal limits  LITHIUM LEVEL - Abnormal; Notable for the following components:   Lithium Lvl 0.54 (*)    All other components within normal limits  ETHANOL  SALICYLATE LEVEL  I-STAT BETA HCG BLOOD, ED ([facility], [facility], [facility] ONLY)  I-STAT BETA HCG BLOOD, ED ([facility], [facility], [facility] ONLY)   Procedures Procedures  Medications Ordered in ED Medications  nicotine (NICODERM CQ - dosed in mg/24 hours) patch 14 mg (has no administration in time range)  alum & mag hydroxide-simeth (MAALOX/MYLANTA) 200-200-20 MG/5ML suspension 30 mL (has no administration in time range)  ondansetron (ZOFRAN) tablet 4 mg (has no administration in time range)  amantadine (SYMMETREL) capsule 100 mg (has no administration in time range)  fluPHENAZine (PROLIXIN) tablet 10 mg (has no administration in time range)  lithium carbonate (ESKALITH) CR tablet 450 mg (has no administration in time range)  acetaminophen (TYLENOL) tablet 650 mg (has no administration in time range)     Initial Impression / Assessment and Plan / ED Course  I have reviewed the triage vital signs and the nursing notes.  Pertinent lab results that were available during my care of the patient were reviewed by me and considered in my medical decision making (see chart for  details).  Paranoid ideation.  Old records are reviewed confirming hospitalization at [facility] and discharge yesterday morning will request TTS consultation.  Screening labs have been obtained and are unremarkable.  Will check lithium level.  Lithium level is barely subtherapeutic, probably adequate for her treatment.  Awaiting TTS consultation.  Final Clinical Impressions(s) / ED Diagnoses   Final diagnoses:  Paranoid ideation [facility])    ED Discharge Orders    None       Dione Booze, MD 05/30/18 [phone]

## 2018-05-30 NOTE — ED Notes (Signed)
Patient refused Nicotine patch

## 2018-05-30 NOTE — ED Notes (Signed)
Called [facility]  Assessment to get update on earlier assessment , was informed that patient would have to re-assessed by Psych NP

## 2018-05-30 NOTE — BH Assessment (Signed)
[facility] Assessment Progress Note    Contacted [facility] at [phone] and informed them that patient will most likely be discharged from the ED this afternoon and asked what the discharge plan would be for patient and whether or not [facility] could transport this patient home.  Case Manager at [facility] to call this Clinical research associate to advise of the plan for patient's discharge.

## 2018-05-30 NOTE — ED Notes (Signed)
Called [facility] spoke with Dannielle Huh , she will be the next to be evaluated.

## 2018-06-02 ENCOUNTER — Encounter (HOSPITAL_BASED_OUTPATIENT_CLINIC_OR_DEPARTMENT_OTHER): Payer: Self-pay | Admitting: *Deleted

## 2018-06-02 ENCOUNTER — Other Ambulatory Visit: Payer: Self-pay

## 2018-06-02 ENCOUNTER — Emergency Department (HOSPITAL_BASED_OUTPATIENT_CLINIC_OR_DEPARTMENT_OTHER)
Admission: EM | Admit: 2018-06-02 | Discharge: 2018-06-02 | Disposition: A | Discharge status: Home / Self Care | Payer: Self-pay | Attending: Emergency Medicine | Admitting: Emergency Medicine

## 2018-06-02 DIAGNOSIS — Z79899 Other long term (current) drug therapy: Secondary | ICD-10-CM | POA: Insufficient documentation

## 2018-06-02 DIAGNOSIS — F209 Schizophrenia, unspecified: Secondary | ICD-10-CM | POA: Insufficient documentation

## 2018-06-02 DIAGNOSIS — F17210 Nicotine dependence, cigarettes, uncomplicated: Secondary | ICD-10-CM | POA: Insufficient documentation

## 2018-06-02 DIAGNOSIS — F22 Delusional disorders: Secondary | ICD-10-CM | POA: Insufficient documentation

## 2018-06-02 LAB — COMPREHENSIVE METABOLIC PANEL
ALT: 18 U/L (ref 0–44)
ANION GAP: 7 (ref 5–15)
AST: 20 U/L (ref 15–41)
Albumin: 4 g/dL (ref 3.5–5.0)
Alkaline Phosphatase: 44 U/L (ref 38–126)
BUN: 9 mg/dL (ref 6–20)
CO2: 23 mmol/L (ref 22–32)
Calcium: 8.7 mg/dL — ABNORMAL LOW (ref 8.9–10.3)
Chloride: 107 mmol/L (ref 98–111)
Creatinine, Ser: 0.74 mg/dL (ref 0.44–1.00)
GFR calc non Af Amer: >60 mL/min (ref >60)
Glucose, Bld: 95 mg/dL (ref 70–99)
Potassium: 3.5 mmol/L (ref 3.5–5.1)
Sodium: 137 mmol/L (ref 135–145)
Total Bilirubin: 0.4 mg/dL (ref 0.3–1.2)
Total Protein: 7.2 g/dL (ref 6.5–8.1)

## 2018-06-02 LAB — PREGNANCY, URINE: Preg Test, Ur: NEGATIVE

## 2018-06-02 LAB — CBC
HCT: 37.6 % (ref 36.0–46.0)
Hemoglobin: 12.3 g/dL (ref 12.0–15.0)
MCH: 32.3 pg (ref 26.0–34.0)
MCHC: 32.7 g/dL (ref 30.0–36.0)
MCV: 98.7 fL (ref 80.0–100.0)
Platelets: 267 10*3/uL (ref 150–400)
RBC: 3.81 MIL/uL — ABNORMAL LOW (ref 3.87–5.11)
RDW: 13.3 % (ref 11.5–15.5)
WBC: 10.4 10*3/uL (ref 4.0–10.5)
nRBC: 0 % (ref 0.0–0.2)

## 2018-06-02 LAB — ETHANOL: Alcohol, Ethyl (B): <10 mg/dL (ref <10)

## 2018-06-02 LAB — RAPID URINE DRUG SCREEN, HOSP PERFORMED
Amphetamines: POSITIVE — AB
Barbiturates: NOT DETECTED
Benzodiazepines: NOT DETECTED
Cocaine: NOT DETECTED
Opiates: NOT DETECTED
Tetrahydrocannabinol: POSITIVE — AB

## 2018-06-02 LAB — SALICYLATE LEVEL: Salicylate Lvl: <7 mg/dL (ref 2.8–30.0)

## 2018-06-02 LAB — ACETAMINOPHEN LEVEL: Acetaminophen (Tylenol), Serum: <10 ug/mL — ABNORMAL LOW (ref 10–30)

## 2018-06-02 LAB — LITHIUM LEVEL: LITHIUM LVL: 0.25 mmol/L — AB (ref 0.60–1.20)

## 2018-06-02 NOTE — Progress Notes (Signed)
Patient is seen by me via tele-psych and I have consulted with Dr. Lucianne Muss.  Patient denies any suicidal homicidal ideations and denies any hallucinations.  Patient does admit to having some paranoia but that is part of her mental health complications.  She states that she has been following up with his been having multiple complications with her family and her living arrangements.  She also stated that her ACT team does not follow-up with her as she would like. She is still followed by her ACT team and they have been contacted.  At this time patient does not meet inpatient criteria and is psychiatrically cleared.  I have contacted Dr. Deretha Emory and notified him of the recommendations.

## 2018-06-02 NOTE — ED Triage Notes (Addendum)
Pt states that the police brought her here for evaluation feeling paranoid. Pt states that has recently been treated for mental health issues for being [facility]  [facility]. Is talking nonstop about her mother and father and family issues and aliens and not sleeping. Pt states she has drank etoh (liquor)  at 1800 last night. States she smokes marijuana. Denies any thoughts of suicide or hurting others.  Pt's belongings were removed from the room and placed in scrubs. Security to wand belongings.

## 2018-06-02 NOTE — Discharge Instructions (Addendum)
Follow-up with your behavioral health providers.  You have been cleared for discharge home by behavioral health team today.

## 2018-06-02 NOTE — Progress Notes (Addendum)
CSW contacted [facility] on the crisis line at [phone]. Jens Som was informed that patient was here at the [facility] emergency room and was being discharged as she was here requesting family therapy. He inquired about when she would be leaving. He was informed that she was being discharged today. After speaking with the nurse for pt, CSW was informed that pt had been unable to make contact with her parents and requested ACTT provide transportation home. CSW contacted J. Manson Passey and he agreed to assist with transportation, stating that he would be there by approximately 11:20.   Vilma Meckel. Algis Greenhouse, MSW, LCSW Clinical Social Work/Disposition Phone: [phone] Fax: [phone]

## 2018-06-02 NOTE — ED Provider Notes (Addendum)
[facility] EMERGENCY DEPARTMENT Provider Note   CSN: [id number] Arrival date & time: 06/02/18  0550     History   Chief Complaint Chief Complaint  Patient presents with  . paranoid    HPI Amy [name] is a 23 y.o. female.  Patient brought in by police.  She called them herself.  Patient stated she was feeling paranoid.  Patient apparently was talking nonstop about her mother and father and family issues and aliens and not sleeping.  Patient admits to drinking alcohol liquor at 1800 last night.  She also smokes marijuana.  Patient denies any suicidal thoughts or any thoughts of hurting anybody else.  Patient seen December 26 here for evaluation of mania.  Patient also seen December 27 for unspecified psychosis at [facility] and was discharged.  Patient seemed January 8 for paranoid ideation evaluated by behavioral health it appeared that they were planning to have arrangement for transfer to an inpatient facility but not clear whether that occurred.     Past Medical History:  Diagnosis Date  . Cannabis abuse   . Depression   . 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 Schizo affective schizophrenia [facility])     Patient Active Problem List   Diagnosis Date Noted  . Schizoaffective psychosis (HCC) 05/18/2018  . 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 Substance induced mood disorder (HCC) 11/14/2017  . 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 Substance abuse (HCC)   . Cocaine abuse with cocaine-induced mood disorder (HCC) 12/16/2016  . Bipolar disorder, curr episode mixed, severe, with psychotic features (HCC) 10/25/2016  . Cocaine use disorder, mild, abuse (HCC) 10/25/2016  . Cannabis use disorder, severe, dependence (HCC) 10/25/2016  . Insomnia   . Anxiety state   . Cocaine abuse (HCC) 07/04/2015  . Cannabis abuse, continuous 07/02/2015  . Non compliance with medical treatment 06/18/2015    History reviewed. No pertinent surgical  history.   OB History   No obstetric history on file.      Home Medications    Prior to Admission medications   Medication Sig Start Date End Date Taking? Authorizing Provider  amantadine (SYMMETREL) 100 MG capsule Take 1 capsule (100 mg total) by mouth 2 (two) times daily for 7 days. 05/29/18 06/05/18  Malvin Johns, MD  fluPHENAZine (PROLIXIN) 10 MG tablet Take 1 tablet (10 mg total) by mouth 3 (three) times daily. 05/29/18   Malvin Johns, MD  lithium carbonate (ESKALITH) 450 MG CR tablet Take 1 tablet (450 mg total) by mouth every 12 (twelve) hours. 05/29/18   Malvin Johns, MD    Family History Family History  Problem Relation Age of Onset  . Mental illness Neg Hx     Social History Social History   Tobacco Use  . Smoking status: Current Every Day Smoker    Packs/day: 0.50    Types: Cigarettes  . Smokeless tobacco: Never Used  Substance Use Topics  . Alcohol use: Not Currently  . Drug use: Yes    Frequency: 7.0 times per week    Types: Marijuana    Comment: smoked 1/2 blunt yesterday     Allergies   Penicillins; Cogentin [benztropine]; Divalproex sodium; Risperidone; and Valproic acid   Review of Systems Review of Systems  Constitutional: Negative for chills and fever.  HENT: Negative for rhinorrhea and sore throat.   Eyes: Negative for visual disturbance.  Respiratory: Negative for cough and shortness of breath.   Cardiovascular: Negative for chest pain and  leg swelling.  Gastrointestinal: Negative for abdominal pain, diarrhea, nausea and vomiting.  Genitourinary: Negative for dysuria.  Musculoskeletal: Negative for back pain and neck pain.  Skin: Negative for rash.  Neurological: Negative for dizziness, light-headedness and headaches.  Hematological: Does not bruise/bleed easily.  Psychiatric/Behavioral: Positive for behavioral problems and sleep disturbance. Negative for confusion, hallucinations and suicidal ideas.     Physical Exam Updated Vital Signs BP  113/74 (BP Location: Left Arm)   Pulse 73   Temp 98.5 F (36.9 C) (Oral)   Resp 20   Ht 1.854 m (6\' 1" )   Wt 85.3 kg   LMP 06/02/2018 (Exact Date)   SpO2 98%   BMI 24.80 kg/m   Physical Exam Vitals signs and nursing note reviewed.  Constitutional:      General: She is not in acute distress.    Appearance: She is well-developed.  HENT:     Head: Normocephalic and atraumatic.     Mouth/Throat:     Mouth: Mucous membranes are moist.  Eyes:     Conjunctiva/sclera: Conjunctivae normal.  Neck:     Musculoskeletal: Normal range of motion and neck supple.  Cardiovascular:     Rate and Rhythm: Normal rate and regular rhythm.     Heart sounds: No murmur.  Pulmonary:     Effort: Pulmonary effort is normal. No respiratory distress.     Breath sounds: Normal breath sounds.  Abdominal:     Palpations: Abdomen is soft.     Tenderness: There is no abdominal tenderness.  Skin:    General: Skin is warm and dry.     Capillary Refill: Capillary refill takes less than 2 seconds.  Neurological:     General: No focal deficit present.     Mental Status: She is alert.     Cranial Nerves: No cranial nerve deficit.     Sensory: No sensory deficit.     Motor: No weakness.  Psychiatric:     Comments: Patient with abnormal thought processes.  Denies any suicidal or homicidal ideation.      ED Treatments / Results  Labs (all labs ordered are listed, but only abnormal results are displayed) Labs Reviewed  COMPREHENSIVE METABOLIC PANEL - Abnormal; Notable for the following components:      Result Value   Calcium 8.7 (*)    All other components within normal limits  CBC - Abnormal; Notable for the following components:   RBC 3.81 (*)    All other components within normal limits  RAPID URINE DRUG SCREEN, HOSP PERFORMED - Abnormal; Notable for the following components:   Amphetamines POSITIVE (*)    Tetrahydrocannabinol POSITIVE (*)    All other components within normal limits    ACETAMINOPHEN LEVEL - Abnormal; Notable for the following components:   Acetaminophen (Tylenol), Serum <10 (*)    All other components within normal limits  ETHANOL  PREGNANCY, URINE  SALICYLATE LEVEL  LITHIUM LEVEL    EKG EKG Interpretation  Date/Time:  Sunday June 02 2018 06:04:38 EST Ventricular Rate:  76 PR Interval:  168 QRS Duration: 92 QT Interval:  372 QTC Calculation: 418 R Axis:   78 Text Interpretation:  Normal sinus rhythm Confirmed by Nicanor Alcon, April ([id number]) on 06/02/2018 6:45:47 AM   Radiology No results found.  Procedures Procedures (including critical care time)  Medications Ordered in ED Medications - No data to display   Initial Impression / Assessment and Plan / ED Course  I have reviewed the triage vital signs and  the nursing notes.  Pertinent labs & imaging results that were available during my care of the patient were reviewed by me and considered in my medical decision making (see chart for details).     Patient medically cleared.  Will have behavioral health interview patient for the paranoid ideation.  Patient without any suicide intent or thoughts.  Patient has been evaluated multiple times throughout December and first part of January for similar problem.  Patient feels her medications are not working.  She feels paranoid.  She contacted police to bring her in.  Patient is voluntary.  No indication for involuntary commitment at this time.  Patient's lithium level is pending.  Patient nontoxic no acute distress.  Behavioral health to evaluate.  Consult placed.  Final Clinical Impressions(s) / ED Diagnoses   Final diagnoses:  Paranoid ideation [facility](HCC)    ED Discharge Orders    None       Vanetta MuldersZackowski, Aerie Donica, MD 06/02/18 [phone]  Addendum: Patient cleared by behavioral health for discharge home and follow-up with her behavioral health providers.  Patient is followed by the act team.  They will be in contact with her.    Vanetta MuldersZackowski,  Lyrick Lagrand, MD 06/02/18 1016

## 2018-06-02 NOTE — ED Notes (Signed)
Pt was wanded by security.  

## 2018-06-02 NOTE — BH Assessment (Signed)
Tele Assessment Note   Patient Name: Amy [name] MRN: [id number] Referring Physician:  Vanetta Mulders, MD Location of Patient:  [facility] Location of Provider: Behavioral Health TTS Department  Amy [name] is an 23 y.o. female presented to [facility] after she called the cops requesting to go to the hospital. Patient report she feels paranoid that are parents are going to place her into [facility]. Report the family has a scheduled appointment Tuesday to complete paperwork so her parents becomes her guardians. Report her father told her once they are her guardians he was going to get her inpatient at [facility]. Patient report her mother told her if she comes to the hospital and get counseling then they will not commit her to the hospital. Patient denies suicidal / homicidal ideations, denies auditory / visual hallucinations. Patient thoughts seems clear, speech and tone within normal limits. Patient oriented x4.  Patient ACTT Team [facility] was contacted by Child psychotherapist.   Reola Calkins, NP, patient does not meet inpatient criteria and is psych-cleared.    Diagnosis:    F20.9 Schizophrenia  Past Medical History:  Past Medical History:  Diagnosis Date  . Cannabis abuse   . Depression   . 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 Schizo affective schizophrenia (HCC)     History reviewed. No pertinent surgical history.  Family History:  Family History  Problem Relation Age of Onset  . Mental illness Neg Hx     Social History:  reports that she has been smoking cigarettes. She has been smoking about 0.50 packs per day. She has never used smokeless tobacco. She reports previous alcohol use. She reports current drug use. Frequency: 7.00 times per week. Drug: Marijuana.  Additional Social History:  Alcohol / Drug Use Pain Medications: see MAR Prescriptions: see MAR Over the Counter: see MAR History of alcohol / drug use?: No history of alcohol / drug  abuse Longest period of sobriety (when/how long): none reported Negative Consequences of Use: Personal relationships Substance #1 Name of Substance 1: Marijuana 1 - Age of First Use: unknown 1 - Amount (size/oz): 1/2 blunt yesterday 1 - Frequency: daily 1 - Duration: since onset 1 - Last Use / Amount: yesterday  CIWA: CIWA-Ar BP: 113/74 Pulse Rate: 73 COWS:    Allergies:  Allergies  Allergen Reactions  . Penicillins Rash    Has patient had a PCN reaction causing immediate rash, facial/tongue/throat swelling, SOB or lightheadedness with hypotension: Yes Has patient had a PCN reaction causing severe rash involving mucus membranes or skin necrosis: No Has patient had a PCN reaction that required hospitalization: No Has patient had a PCN reaction occurring within the last 10 years: Yes If all of the above answers are "NO", then may   . Cogentin [Benztropine] Itching  . Divalproex Sodium Other (See Comments)    Creates feelings of paranoia, some suicidal feelings, and makes her feel that "people are coming after" her Creates feelings of paranoia, some suicidal feelings, and makes her feel that "people are coming after" her  . Risperidone Other (See Comments)    "Makes me cough"  . Valproic Acid Other (See Comments)    Creates paranoia/Per [facility] Creates paranoia/Per [facility]    Home Medications: (Not in a hospital admission)   OB/GYN Status:  Patient's last menstrual period was 06/02/2018 (exact date).  General Assessment Data Location of Assessment: [facility] TTS Assessment: In system Is this a Tele or  Face-to-Face Assessment?: Tele Assessment Is this an Initial Assessment or a Re-assessment for this encounter?: Initial Assessment Patient Accompanied by:: [name] Language Other than English: No Living Arrangements: Other (Comment)(lives with parents ) What gender do you identify as?: Female Marital status: Single Maiden name: Biebel Pregnancy  Status: No Living Arrangements: Parent Can pt return to current living arrangement?: Yes Admission Status: Voluntary Is patient capable of signing voluntary admission?: Yes Referral Source: Self/Family/Friend Insurance type: Medicaid     Crisis Care Plan Living Arrangements: Parent Legal Guardian: Other:(self) Name of Psychiatrist: Dr. Jeannine KittenFarah Name of Therapist: [facility]  Education Status Is patient currently in school?: No Is the patient employed, unemployed or receiving disability?: Unemployed  Risk to self with the past 6 months Suicidal Ideation: No Has patient been a risk to self within the past 6 months prior to admission? : No Suicidal Intent: No Has patient had any suicidal intent within the past 6 months prior to admission? : No Is patient at risk for suicide?: No Suicidal Plan?: No Has patient had any suicidal plan within the past 6 months prior to admission? : No Access to Means: No What has been your use of drugs/alcohol within the last 12 months?: daily THC use Previous Attempts/Gestures: No How many times?: 0 Other Self Harm Risks: (none reported) Triggers for Past Attempts: None known Intentional Self Injurious Behavior: None Family Suicide History: No Recent stressful life event(s): Other (Comment)(none reported) Persecutory voices/beliefs?: No Depression: No Depression Symptoms: (none reported ) Substance abuse history and/or treatment for substance abuse?: Yes Suicide prevention information given to non-admitted patients: Not applicable  Risk to Others within the past 6 months Homicidal Ideation: No Does patient have any lifetime risk of violence toward others beyond the six months prior to admission? : No Thoughts of Harm to Others: No Current Homicidal Intent: No Current Homicidal Plan: No-Not Currently/Within Last 6 Months Access to Homicidal Means: No Identified Victim: none History of harm to others?: No Assessment of Violence: None  Noted Violent Behavior Description: none Does patient have access to weapons?: No Criminal Charges Pending?: No Does patient have a court date: No Is patient on probation?: No  Psychosis Hallucinations: None noted Delusions: None noted(do not trust parents, thinks parents going to send her away)  Mental Status Report Appearance/Hygiene: Unremarkable Eye Contact: Good Motor Activity: Freedom of movement Speech: Logical/coherent Level of Consciousness: Alert Mood: Pleasant Affect: Appropriate to circumstance Anxiety Level: None Thought Processes: Coherent, Relevant Judgement: Partial Orientation: Person, Place, Time, Situation Obsessive Compulsive Thoughts/Behaviors: None  Cognitive Functioning Concentration: Normal Memory: Recent Intact, Remote Intact Is patient IDD: No Insight: Fair Impulse Control: Fair Appetite: Good Have you had any weight changes? : No Change Sleep: No Change Total Hours of Sleep: 5  ADLScreening [facility] Assessment Services) Patient's cognitive ability adequate to safely complete daily activities?: Yes Patient able to express need for assistance with ADLs?: Yes Independently performs ADLs?: Yes (appropriate for developmental age)  Prior Inpatient Therapy Prior Inpatient Therapy: Yes Prior Therapy Dates: 2020 [facility] Reason for Treatment: Schizophrenia  Prior Outpatient Therapy Prior Outpatient Therapy: Yes Prior Therapy Dates: active Prior Therapy Facilty/Provider(s): [facility] Reason for Treatment: schizophrenia Does patient have an ACCT team?: Yes Does patient have Intensive In-House Services?  : No Does patient have [facility] services? : No Does patient have P4CC services?: No  ADL Screening (condition at time of admission) Patient's cognitive ability adequate to safely complete daily activities?: Yes Is the patient deaf or have  difficulty hearing?: No Does the patient have difficulty seeing, even  when wearing glasses/contacts?: No Does the patient have difficulty concentrating, remembering, or making decisions?: No Patient able to express need for assistance with ADLs?: Yes Does the patient have difficulty dressing or bathing?: No Independently performs ADLs?: Yes (appropriate for developmental age) Does the patient have difficulty walking or climbing stairs?: No       Abuse/Neglect Assessment (Assessment to be complete while patient is alone) Abuse/Neglect Assessment Can Be Completed: Yes Physical Abuse: Denies Verbal Abuse: Denies Sexual Abuse: Denies Exploitation of patient/patient's resources: Denies Self-Neglect: Denies     Merchant navy officer (For Healthcare) Does Patient Have a Medical Advance Directive?: No Would patient like information on creating a medical advance directive?: No - Patient declined          Disposition:  Disposition Initial Assessment Completed for this Encounter: Yes(Travis Money, NP, pt does not meet inpt criteria )  This service was provided via telemedicine using a 2-way, interactive audio and video technology.  Names of all persons participating in this telemedicine service and their role in this encounter. Name:  Aysiah Canavan Role:  Patient   Name:  Blane Ohara Role:   TTS   Name:  Role:   Name:  Role:     Lebaron Bautch [facility] 06/02/2018 10:14 AM

## 2018-06-04 ENCOUNTER — Encounter (HOSPITAL_COMMUNITY): Payer: Self-pay | Admitting: Emergency Medicine

## 2018-06-04 ENCOUNTER — Emergency Department (HOSPITAL_COMMUNITY)
Admission: EM | Admit: 2018-06-04 | Discharge: 2018-06-04 | Disposition: A | Discharge status: Home / Self Care | Payer: Self-pay | Attending: Emergency Medicine | Admitting: Emergency Medicine

## 2018-06-04 ENCOUNTER — Other Ambulatory Visit: Payer: Self-pay

## 2018-06-04 DIAGNOSIS — F250 Schizoaffective disorder, bipolar type: Secondary | ICD-10-CM | POA: Insufficient documentation

## 2018-06-04 DIAGNOSIS — Z79899 Other long term (current) drug therapy: Secondary | ICD-10-CM | POA: Insufficient documentation

## 2018-06-04 DIAGNOSIS — F17210 Nicotine dependence, cigarettes, uncomplicated: Secondary | ICD-10-CM | POA: Insufficient documentation

## 2018-06-04 DIAGNOSIS — R45851 Suicidal ideations: Secondary | ICD-10-CM | POA: Insufficient documentation

## 2018-06-04 LAB — CBC WITH DIFFERENTIAL/PLATELET
Abs Immature Granulocytes: 0.02 10*3/uL (ref 0.00–0.07)
BASOS PCT: 1 %
Basophils Absolute: 0 10*3/uL (ref 0.0–0.1)
Eosinophils Absolute: 0.3 10*3/uL (ref 0.0–0.5)
Eosinophils Relative: 3 %
HCT: 38.1 % (ref 36.0–46.0)
Hemoglobin: 12.3 g/dL (ref 12.0–15.0)
Immature Granulocytes: 0 %
Lymphocytes Relative: 40 %
Lymphs Abs: 3.3 10*3/uL (ref 0.7–4.0)
MCH: 33.2 pg (ref 26.0–34.0)
MCHC: 32.3 g/dL (ref 30.0–36.0)
MCV: 103 fL — ABNORMAL HIGH (ref 80.0–100.0)
Monocytes Absolute: 0.5 10*3/uL (ref 0.1–1.0)
Monocytes Relative: 6 %
Neutro Abs: 4.2 10*3/uL (ref 1.7–7.7)
Neutrophils Relative %: 50 %
PLATELETS: 243 10*3/uL (ref 150–400)
RBC: 3.7 MIL/uL — AB (ref 3.87–5.11)
RDW: 13.5 % (ref 11.5–15.5)
WBC: 8.3 10*3/uL (ref 4.0–10.5)
nRBC: 0 % (ref 0.0–0.2)

## 2018-06-04 LAB — BASIC METABOLIC PANEL
Anion gap: 8 (ref 5–15)
BUN: 11 mg/dL (ref 6–20)
CO2: 23 mmol/L (ref 22–32)
Calcium: 8.6 mg/dL — ABNORMAL LOW (ref 8.9–10.3)
Chloride: 110 mmol/L (ref 98–111)
Creatinine, Ser: 0.77 mg/dL (ref 0.44–1.00)
GFR calc non Af Amer: >60 mL/min (ref >60)
Glucose, Bld: 91 mg/dL (ref 70–99)
Potassium: 3.7 mmol/L (ref 3.5–5.1)
SODIUM: 141 mmol/L (ref 135–145)

## 2018-06-04 LAB — I-STAT BETA HCG BLOOD, ED (MC, WL, AP ONLY): I-stat hCG, quantitative: <5 m[IU]/mL (ref <5)

## 2018-06-04 LAB — SALICYLATE LEVEL: Salicylate Lvl: <7 mg/dL (ref 2.8–30.0)

## 2018-06-04 LAB — ETHANOL: Alcohol, Ethyl (B): 58 mg/dL — ABNORMAL HIGH (ref <10)

## 2018-06-04 LAB — ACETAMINOPHEN LEVEL

## 2018-06-04 MED ORDER — IBUPROFEN 200 MG PO TABS: 600.0000 mg | ORAL_TABLET | Freq: Three times a day (TID) | ORAL | Status: DC | PRN

## 2018-06-04 MED ORDER — ALUM & MAG HYDROXIDE-SIMETH 200-200-20 MG/5ML PO SUSP: 30.0000 mL | Freq: Four times a day (QID) | ORAL | Status: DC | PRN

## 2018-06-04 MED ORDER — NICOTINE 21 MG/24HR TD PT24
21.0000 mg | MEDICATED_PATCH | Freq: Every day | TRANSDERMAL | Status: DC
  Filled 2018-06-04: qty 1

## 2018-06-04 NOTE — ED Triage Notes (Addendum)
Pt arriving with suicidal ideations. Pt states she has theses thoughts when she takes ZQuil. Pt states she takes the ZQuil because her psych meds keep her awaike. Pt slightly lethargic at this time but able to answer questions. [facility]. Pt says she came her because she feels like she needs to be admitted.

## 2018-06-04 NOTE — ED Provider Notes (Signed)
[facility]-EMERGENCY DEPT Provider Note   CSN: [id number] Arrival date & time: 06/04/18  0105     History   Chief Complaint Chief Complaint  Patient presents with  . Suicidal    HPI Amy [name] is a 23 y.o. 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  She reports she has previously taken ZzzQuil and discussed her to have suicidal ideations.  She reports that she took a capful tonight because she was having difficulty sleeping.  She also reports that she drank 1 beer prior to arrival.  She was seen at bedside in [facility] 2 days ago for the same.  She denies having a plan.  She denies HI or visual hallucinations.  She has no auditory hallucinations.  She reports occasional marijuana use.  She also smokes cigarettes daily.  She is established with the ACTT team.   She denies fever, chills, abdominal pain, chest pain, shortness of breath, nausea, vomiting, diarrhea, rash.  The history is provided by the patient. No language interpreter was used.    Past Medical History:  Diagnosis Date  . Cannabis abuse   . Depression   . 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 Schizo affective schizophrenia [facility])     Patient Active Problem List   Diagnosis Date Noted  . Schizoaffective psychosis (HCC) 05/18/2018  . 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 Substance induced mood disorder (HCC) 11/14/2017  . 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 Substance abuse (HCC)   . Cocaine abuse with cocaine-induced mood disorder (HCC) 12/16/2016  . Bipolar disorder, curr episode mixed, severe, with psychotic features (HCC) 10/25/2016  . Cocaine use disorder, mild, abuse (HCC) 10/25/2016  .  Cannabis use disorder, severe, dependence (HCC) 10/25/2016  . Insomnia   . Anxiety state   . Cocaine abuse (HCC) 07/04/2015  . Cannabis abuse, continuous 07/02/2015  . Non compliance with medical treatment 06/18/2015    History reviewed. No pertinent surgical history.   OB History   No obstetric history on file.      Home Medications    Prior to Admission medications   Medication Sig Start Date End Date Taking? Authorizing Provider  amantadine (SYMMETREL) 100 MG capsule Take 1 capsule (100 mg total) by mouth 2 (two) times daily for 7 days. 05/29/18 06/05/18 Yes Malvin Johns, MD  fluPHENAZine (PROLIXIN) 5 MG tablet Take 10 mg by mouth 3 (three) times daily.   Yes [provider]  lithium carbonate (ESKALITH) 450 MG CR tablet Take 1 tablet (450 mg total) by mouth every 12 (twelve) hours. 05/29/18  Yes Malvin Johns, MD  fluPHENAZine (PROLIXIN) 10 MG tablet Take 1 tablet (10 mg total) by mouth 3 (three) times daily. Patient not taking: Reported on 06/04/2018 05/29/18   Malvin Johns, MD    Family History Family History  Problem Relation Age of Onset  . Mental illness Neg Hx     Social History Social History   Tobacco Use  . Smoking status: Current Every Day Smoker    Packs/day: 0.50    Types: Cigarettes  . Smokeless tobacco: Never Used  Substance Use Topics  . Alcohol use: Not Currently  . Drug use: Yes    Frequency: 7.0 times per week    Types: Marijuana    Comment:  smoked 1/2 blunt yesterday     Allergies   Penicillins; Cogentin [benztropine]; Divalproex sodium; Risperidone; and Valproic acid   Review of Systems Review of Systems  Constitutional: Negative for activity change, chills and fever.  HENT: Negative for congestion and sore throat.   Respiratory: Negative for shortness of breath and wheezing.   Cardiovascular: Negative for chest pain.  Gastrointestinal: Negative for abdominal pain, diarrhea, nausea and vomiting.  Genitourinary: Negative for dysuria  and urgency.  Musculoskeletal: Negative for back pain.  Skin: Negative for rash.  Allergic/Immunologic: Negative for immunocompromised state.  Neurological: Negative for headaches.  Psychiatric/Behavioral: Positive for sleep disturbance and suicidal ideas. Negative for confusion and hallucinations. The patient is not nervous/anxious.    Physical Exam Updated Vital Signs BP (!) 113/58 (BP Location: Left Arm)   Pulse 68   Temp 98.1 F (36.7 C) (Oral)   Resp 18   LMP 06/02/2018 (Exact Date)   SpO2 98%   Physical Exam Vitals signs and nursing note reviewed.  Constitutional:      General: She is not in acute distress. HENT:     Head: Normocephalic.  Eyes:     General: No scleral icterus.    Extraocular Movements: Extraocular movements intact.     Conjunctiva/sclera: Conjunctivae normal.     Pupils: Pupils are equal, round, and reactive to light.  Neck:     Musculoskeletal: Neck supple. No neck rigidity.  Cardiovascular:     Rate and Rhythm: Normal rate and regular rhythm.     Heart sounds: No murmur. No friction rub. No gallop.   Pulmonary:     Effort: Pulmonary effort is normal. No respiratory distress.     Breath sounds: No stridor. No wheezing, rhonchi or rales.  Chest:     Chest wall: No tenderness.  Abdominal:     General: There is no distension.     Palpations: Abdomen is soft.     Tenderness: There is no abdominal tenderness. There is no right CVA tenderness, left CVA tenderness, guarding or rebound.     Hernia: No hernia is present.  Skin:    General: Skin is warm.     Findings: No rash.  Neurological:     Mental Status: She is alert.     Comments: Drowsy, but easily arousable to voice.  Speaks in complete, fluent sentences.  No slurred speech.  Protecting her airway.  Opens her eyes to voice.  Moves all 4 extremities.  Psychiatric:        Attention and Perception: She does not perceive auditory or visual hallucinations.        Behavior: Behavior normal.         Thought Content: Thought content does not include homicidal or suicidal plan.      ED Treatments / Results  Labs (all labs ordered are listed, but only abnormal results are displayed) Labs Reviewed  CBC WITH DIFFERENTIAL/PLATELET - Abnormal; Notable for the following components:      Result Value   RBC 3.70 (*)    MCV 103.0 (*)    All other components within normal limits  BASIC METABOLIC PANEL - Abnormal; Notable for the following components:   Calcium 8.6 (*)    All other components within normal limits  ETHANOL - Abnormal; Notable for the following components:   Alcohol, Ethyl (B) 58 (*)    All other components within normal limits  ACETAMINOPHEN LEVEL - Abnormal; Notable for the following components:   Acetaminophen (Tylenol), Serum <10 (*)  All other components within normal limits  SALICYLATE LEVEL  URINALYSIS, ROUTINE W REFLEX MICROSCOPIC  RAPID URINE DRUG SCREEN, HOSP PERFORMED  I-STAT BETA HCG BLOOD, ED ([facility], [facility], [facility] ONLY)    EKG None  Radiology No results found.  Procedures Procedures (including critical care time)  Medications Ordered in ED Medications  ibuprofen (ADVIL,MOTRIN) tablet 600 mg (has no administration in time range)  alum & mag hydroxide-simeth (MAALOX/MYLANTA) 200-200-20 MG/5ML suspension 30 mL (has no administration in time range)  nicotine (NICODERM CQ - dosed in mg/24 hours) patch 21 mg (has no administration in time range)     Initial Impression / Assessment and Plan / ED Course  I have reviewed the triage vital signs and the nursing notes.  Pertinent labs & imaging results that were available during my care of the patient were reviewed by me and considered in my medical decision making (see chart for details).      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  She also reports drinking 1 beer prior to arrival.  On exam, she is drowsy, but easily arousable to voice.  Labs are reassuring.  Patient has no other complaints at this time.  She is medically cleared at this time.  TTS was consulted who recommended discharge to home with her Act team with OP follow up.  Consulted social work who spoke with Pearson GrippeKenzie who has coordinated the patient's discharge with her ACT team member.  Strict return precautions given.  She is hemodynamically stable and in no acute distress.  She is safe for discharge home with outpatient follow-up at this time.  Final Clinical Impressions(s) / ED Diagnoses   Final diagnoses:  Schizoaffective disorder, bipolar type [facility](HCC)    ED Discharge Orders    None       Barkley BoardsMcDonald, Keryl Gholson A, PA-C 06/04/18 0857    Molpus, Jonny RuizJohn, MD 06/04/18 2244

## 2018-06-04 NOTE — ED Notes (Signed)
Pt alert and oriented. Pt denies any pain or discomfort at this time. Pt c/o of si, pt denies hi and avh. Pt contract to safety. Pt given a sandwich and a soda. Pt cooperative and resting in bed quietly. Pt safe , will continue to monitor.

## 2018-06-04 NOTE — Discharge Instructions (Signed)
For your behavioral health needs, you are advised to continue treatment with the [facility] ACT Team:       [facility]      [address]      [place], [place] [address]      [phone] 

## 2018-06-04 NOTE — BH Assessment (Signed)
[facility] Assessment Progress Note  Per [name], FNP, this pt does not require psychiatric hospitalization at this time.  Pt is to be discharged from [facility] with recommendation to continue treatment with the [facility] ACT Team.  This has been included in pt's discharge instructions.  Pt's nurse, Lanora Manis, has been notified.  Doylene Canning, MA Triage Specialist [phone]

## 2018-06-04 NOTE — ED Notes (Signed)
Patient is alert and oriented to person, place and time.  Presents with calm affect and mood.  Denies suicidal thoughts, auditory and visual hallucinations.  Routine safety checks maintained every 15 minutes and via security camera.  Patient is safe on the unit.  Verbalizes understanding of discharge instructions and follow-up.

## 2018-06-04 NOTE — ED Notes (Signed)
Bed: WTR5 Expected date:  Expected time:  Means of arrival:  Comments: 

## 2018-06-04 NOTE — BH Assessment (Addendum)
Assessment Note  Amy [name] is an 23 y.o. female.  -Clinician reviewed note by Mariane MastersLindsay Coles, RN.  Pt arriving with suicidal ideations. Pt states she has theses thoughts when she takes ZQuil. Pt states she takes the ZQuil because her psych meds keep her awaike. Pt slightly lethargic at this time but able to answer questions. Se[facility] at [facility] for same. Pt says she came her because she feels like she needs to be admitted.  Patient does appear drowsy.  She is cooperative.  She says that she took Zquil because of not being able to sleep otherwise.  She reports getting less than 5 hours of sleep at night.  Patient admits that she sleeps on a mattress on the floor in her mother's room.  She does this because she is afraid to sleep in her own bed because of fear that there is something in the closet that will get her.  Patient says "kind of" when asked if she felt suicidal.  Patient denies a plan to kill herself.  She says she has had one attempt before.  Patient has no HI or visual hallucinations.  She says at times she will hear knocking in her room.  Patient says she uses marijuana about once per week.  She says she drinks beer once in a while, less than once weekly.  Patient has been visiting the other EDs over the last few weeks.  She has been averaging multiple visits in a week over the last three weeks.  Patient says that she has ACTT team services through [facility].  She says she did not contact them "because they take too long to respond."   [facility].  -Clinician discussed patient care with Nira ConnJason Berry, FNP.  He said that patient does not meet inpatient care criteria.  Patient can be discharged back to [facility].  Clinician discussed disposition with [name].   Diagnosis: F25.0 Schizoafffective d/o bipolar type; F12.20 Cannabis use d/o moderate  Past Medical History:  Past Medical History:  Diagnosis Date  . Cannabis abuse    . Depression   . 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 Schizo affective schizophrenia (HCC)     History reviewed. No pertinent surgical history.  Family History:  Family History  Problem Relation Age of Onset  . Mental illness Neg Hx     Social History:  reports that she has been smoking cigarettes. She has been smoking about 0.50 packs per day. She has never used smokeless tobacco. She reports previous alcohol use. She reports current drug use. Frequency: 7.00 times per week. Drug: Marijuana.  Additional Social History:  Alcohol / Drug Use Pain Medications: See PTA medication list Prescriptions: See PTA medication list Over the Counter: See PTA medication list History of alcohol / drug use?: Yes Substance #1 Name of Substance 1: Marijuana 1 - Age of First Use: 23 years of age 52 - Amount (size/oz): About half a blunt at a time 1 - Frequency: Once per week 1 - Duration:  off and on 1 - Last Use / Amount: 06/03/18  CIWA: CIWA-Ar BP: 107/66 Pulse Rate: 70 COWS:    Allergies:  Allergies  Allergen Reactions  . Penicillins Rash    Has patient had a PCN reaction causing immediate rash, facial/tongue/throat swelling, SOB or lightheadedness with hypotension: Yes Has patient had a PCN reaction causing severe rash involving mucus membranes or skin necrosis: No Has patient had a  PCN reaction that required hospitalization: No Has patient had a PCN reaction occurring within the last 10 years: Yes If all of the above answers are "NO", then may   . Cogentin [Benztropine] Itching  . Divalproex Sodium Other (See Comments)    Creates feelings of paranoia, some suicidal feelings, and makes her feel that "people are coming after" her Creates feelings of paranoia, some suicidal feelings, and makes her feel that "people are coming after" her  . Risperidone Other (See Comments)    "Makes me cough"  . Valproic Acid Other (See Comments)    Creates paranoia/Per [facility] Creates paranoia/Per  [facility]    Home Medications: (Not in a hospital admission)   OB/GYN Status:  Patient's last menstrual period was 06/02/2018 (exact date).  General Assessment Data [facility] of Assessment: [facility] TTS Assessment: In system Is this a Tele or Face-to-Face Assessment?: Face-to-Face Is this an Initial Assessment or a Re-assessment for this encounter?: Initial Assessment Patient Accompanied by:: [name] Language Other than English: No Living Arrangements: Other (Comment)(Lives with parents) What gender do you identify as?: Female Marital status: Single Pregnancy Status: No Living Arrangements: Parent Can pt return to current living arrangement?: Yes Admission Status: Voluntary Is patient capable of signing voluntary admission?: Yes Referral Source: Self/Family/Friend(Parents called EMS.) Insurance type: MCD     Crisis Care Plan Living Arrangements: Parent Name of Psychiatrist: Dr. Jeannine KittenFarah Name of Therapist: [facility]  Education Status Is patient currently in school?: No Highest grade of school patient has completed: High school diploma, one semester of college Is the patient employed, unemployed or receiving disability?: Unemployed  Risk to self with the past 6 months Suicidal Ideation: Yes-Currently Present Has patient been a risk to self within the past 6 months prior to admission? : No Suicidal Intent: No Has patient had any suicidal intent within the past 6 months prior to admission? : No Is patient at risk for suicide?: No Suicidal Plan?: No Has patient had any suicidal plan within the past 6 months prior to admission? : No Access to Means: No What has been your use of drugs/alcohol within the last 12 months?: THC Previous Attempts/Gestures: Yes How many times?: 1 Other Self Harm Risks: None Triggers for Past Attempts: Unpredictable Intentional Self Injurious Behavior: None Family Suicide History: No Recent stressful life event(s): Other (Comment)(Nothing  significant other than medication not working.) Persecutory voices/beliefs?: Yes Depression: Yes Depression Symptoms: Despondent, Insomnia, Tearfulness, Loss of interest in usual pleasures, Feeling worthless/self pity, Isolating Substance abuse history and/or treatment for substance abuse?: Yes Suicide prevention information given to non-admitted patients: Not applicable  Risk to Others within the past 6 months Homicidal Ideation: No Does patient have any lifetime risk of violence toward others beyond the six months prior to admission? : No Thoughts of Harm to Others: No Current Homicidal Intent: No Current Homicidal Plan: No Access to Homicidal Means: No Identified Victim: No one History of harm to others?: No Assessment of Violence: None Noted Violent Behavior Description: None reported Does patient have access to weapons?: No Criminal Charges Pending?: No Does patient have a court date: No Is patient on probation?: No  Psychosis Hallucinations: Auditory(Will hear knocking in her room at night) Delusions: Persecutory(Something will get her at night.)  Mental Status Report Appearance/Hygiene: Disheveled, In scrubs Eye Contact: Fair Motor Activity: Freedom of movement, Unremarkable Speech: Logical/coherent Level of Consciousness: Quiet/awake, Drowsy Mood: Depressed, Helpless, Sad Affect: Blunted, Depressed Anxiety Level: Panic Attacks Panic attack frequency: "Lately almost  every day" Most recent panic attack: -06/02/18 Thought Processes: Coherent, Relevant Judgement: Impaired Orientation: Person, Place, Time, Situation Obsessive Compulsive Thoughts/Behaviors: None  Cognitive Functioning Concentration: Decreased Is patient IDD: No Insight: Fair Impulse Control: Fair Appetite: Good Have you had any weight changes? : No Change Sleep: Decreased Total Hours of Sleep: 5 Vegetative Symptoms: Staying in bed  ADLScreening [facility] Assessment Services) Patient's cognitive  ability adequate to safely complete daily activities?: Yes Patient able to express need for assistance with ADLs?: Yes Independently performs ADLs?: Yes (appropriate for developmental age)  Prior Inpatient Therapy Prior Inpatient Therapy: Yes Prior Therapy Dates: 2020 Prior Therapy Facilty/Provider(s): [facility] Reason for Treatment: Schizophrenia  Prior Outpatient Therapy Prior Outpatient Therapy: Yes Prior Therapy Dates: active Prior Therapy Facilty/Provider(s): [facility] Reason for Treatment: schizophrenia Does patient have an ACCT team?: Yes Does patient have Intensive In-House Services?  : No Does patient have [facility] services? : No Does patient have P4CC services?: No  ADL Screening (condition at time of admission) Patient's cognitive ability adequate to safely complete daily activities?: Yes Is the patient deaf or have difficulty hearing?: No Does the patient have difficulty seeing, even when wearing glasses/contacts?: No Does the patient have difficulty concentrating, remembering, or making decisions?: Yes Patient able to express need for assistance with ADLs?: Yes Does the patient have difficulty dressing or bathing?: No Independently performs ADLs?: Yes (appropriate for developmental age) Does the patient have difficulty walking or climbing stairs?: No Weakness of Legs: None Weakness of Arms/Hands: None       Abuse/Neglect Assessment (Assessment to be complete while patient is alone) Abuse/Neglect Assessment Can Be Completed: Yes Physical Abuse: Denies Verbal Abuse: Denies Sexual Abuse: Denies Exploitation of patient/patient's resources: Denies Self-Neglect: Denies     Merchant navy officer (For Healthcare) Does Patient Have a Medical Advance Directive?: No Would patient like information on creating a medical advance directive?: No - Patient declined          Disposition:  Disposition Initial Assessment Completed for this Encounter: Yes Patient  referred to: Other (Comment)  On Site Evaluation by:   Reviewed with Physician:    Beatriz Stallion Ray 06/04/2018 5:07 AM

## 2018-06-04 NOTE — Progress Notes (Signed)
CSW aware of consult for assistance with discharge. CSW received phone call from EDPA Mia who reports patient is active with ACTT services and is in need of transportation. CSW reached out to [facility] through their crisis line at [phone] and was informed someone would be by to pick up patient between 10 and 11. Patient and patient's RN have been notified. No further CSW needs at this time. Please reconsult if needs arise.  Archie Balboa, LCSWA  Clinical Social Work Department  [employer]  [phone]

## 2018-06-04 NOTE — ED Notes (Signed)
Bed: WBH37 Expected date:  Expected time:  Means of arrival:  Comments: 

## 2018-06-20 ENCOUNTER — Emergency Department
Admission: EM | Admit: 2018-06-20 | Discharge: 2018-06-20 | Disposition: A | Discharge status: Home / Self Care | Payer: Self-pay | Attending: Emergency Medicine | Admitting: Emergency Medicine

## 2018-06-20 ENCOUNTER — Encounter: Payer: Self-pay | Admitting: Emergency Medicine

## 2018-06-20 ENCOUNTER — Other Ambulatory Visit: Payer: Self-pay

## 2018-06-20 DIAGNOSIS — F22 Delusional disorders: Secondary | ICD-10-CM | POA: Insufficient documentation

## 2018-06-20 DIAGNOSIS — F250 Schizoaffective disorder, bipolar type: Secondary | ICD-10-CM | POA: Diagnosis present

## 2018-06-20 DIAGNOSIS — F17210 Nicotine dependence, cigarettes, uncomplicated: Secondary | ICD-10-CM | POA: Insufficient documentation

## 2018-06-20 DIAGNOSIS — Z79899 Other long term (current) drug therapy: Secondary | ICD-10-CM | POA: Insufficient documentation

## 2018-06-20 DIAGNOSIS — F1290 Cannabis use, unspecified, uncomplicated: Secondary | ICD-10-CM | POA: Insufficient documentation

## 2018-06-20 DIAGNOSIS — F1210 Cannabis abuse, uncomplicated: Secondary | ICD-10-CM | POA: Diagnosis present

## 2018-06-20 LAB — CBC WITH DIFFERENTIAL/PLATELET
Abs Immature Granulocytes: 0.02 10*3/uL (ref 0.00–0.07)
BASOS PCT: 0 %
Basophils Absolute: 0 10*3/uL (ref 0.0–0.1)
EOS ABS: 0 10*3/uL (ref 0.0–0.5)
EOS PCT: 0 %
HCT: 37.1 % (ref 36.0–46.0)
Hemoglobin: 12.3 g/dL (ref 12.0–15.0)
Immature Granulocytes: 0 %
Lymphocytes Relative: 25 %
Lymphs Abs: 2.5 10*3/uL (ref 0.7–4.0)
MCH: 32.9 pg (ref 26.0–34.0)
MCHC: 33.2 g/dL (ref 30.0–36.0)
MCV: 99.2 fL (ref 80.0–100.0)
Monocytes Absolute: 0.6 10*3/uL (ref 0.1–1.0)
Monocytes Relative: 7 %
Neutro Abs: 6.6 10*3/uL (ref 1.7–7.7)
Neutrophils Relative %: 68 %
PLATELETS: 261 10*3/uL (ref 150–400)
RBC: 3.74 MIL/uL — ABNORMAL LOW (ref 3.87–5.11)
RDW: 13.2 % (ref 11.5–15.5)
WBC: 9.8 10*3/uL (ref 4.0–10.5)
nRBC: 0 % (ref 0.0–0.2)

## 2018-06-20 LAB — URINALYSIS, COMPLETE (UACMP) WITH MICROSCOPIC
Bilirubin Urine: NEGATIVE
Glucose, UA: NEGATIVE mg/dL
Hgb urine dipstick: NEGATIVE
Ketones, ur: NEGATIVE mg/dL
Leukocytes, UA: NEGATIVE
Nitrite: NEGATIVE
Protein, ur: 30 mg/dL — AB
Specific Gravity, Urine: 1.021 (ref 1.005–1.030)
pH: 5 (ref 5.0–8.0)

## 2018-06-20 LAB — COMPREHENSIVE METABOLIC PANEL
ALT: 10 U/L (ref 0–44)
AST: 12 U/L — ABNORMAL LOW (ref 15–41)
Albumin: 4 g/dL (ref 3.5–5.0)
Alkaline Phosphatase: 43 U/L (ref 38–126)
Anion gap: 5 (ref 5–15)
BUN: 12 mg/dL (ref 6–20)
CO2: 26 mmol/L (ref 22–32)
Calcium: 8.9 mg/dL (ref 8.9–10.3)
Chloride: 107 mmol/L (ref 98–111)
Creatinine, Ser: 0.74 mg/dL (ref 0.44–1.00)
GFR calc Af Amer: >60 mL/min (ref >60)
GFR calc non Af Amer: >60 mL/min (ref >60)
Glucose, Bld: 96 mg/dL (ref 70–99)
Potassium: 4.2 mmol/L (ref 3.5–5.1)
SODIUM: 138 mmol/L (ref 135–145)
Total Bilirubin: 0.5 mg/dL (ref 0.3–1.2)
Total Protein: 7 g/dL (ref 6.5–8.1)

## 2018-06-20 LAB — URINE DRUG SCREEN, QUALITATIVE (ARMC ONLY)
Amphetamines, Ur Screen: NOT DETECTED
Barbiturates, Ur Screen: NOT DETECTED
Benzodiazepine, Ur Scrn: NOT DETECTED
CANNABINOID 50 NG, UR ~~LOC~~: POSITIVE — AB
Cocaine Metabolite,Ur ~~LOC~~: NOT DETECTED
MDMA (Ecstasy)Ur Screen: NOT DETECTED
Methadone Scn, Ur: NOT DETECTED
Opiate, Ur Screen: NOT DETECTED
Phencyclidine (PCP) Ur S: NOT DETECTED
Tricyclic, Ur Screen: NOT DETECTED

## 2018-06-20 LAB — HEMOGLOBIN A1C
Hgb A1c MFr Bld: 5 % (ref 4.8–5.6)
MEAN PLASMA GLUCOSE: 96.8 mg/dL

## 2018-06-20 LAB — LIPID PANEL
Cholesterol: 125 mg/dL (ref 0–200)
HDL: 51 mg/dL (ref >40)
LDL Cholesterol: 65 mg/dL (ref 0–99)
Total CHOL/HDL Ratio: 2.5 RATIO
Triglycerides: 43 mg/dL (ref <150)
VLDL: 9 mg/dL (ref 0–40)

## 2018-06-20 LAB — ETHANOL: Alcohol, Ethyl (B): <10 mg/dL (ref <10)

## 2018-06-20 LAB — SALICYLATE LEVEL: Salicylate Lvl: <7 mg/dL (ref 2.8–30.0)

## 2018-06-20 LAB — ACETAMINOPHEN LEVEL

## 2018-06-20 LAB — POCT PREGNANCY, URINE: Preg Test, Ur: NEGATIVE

## 2018-06-20 LAB — LITHIUM LEVEL: Lithium Lvl: <0.06 mmol/L — ABNORMAL LOW (ref 0.60–1.20)

## 2018-06-20 LAB — LIPASE, BLOOD: LIPASE: 26 U/L (ref 11–51)

## 2018-06-20 MED ORDER — ARIPIPRAZOLE ER 400 MG IM SRER
400.0000 mg | INTRAMUSCULAR | Status: DC
  Administered 2018-06-20: 400 mg via INTRAMUSCULAR
  Filled 2018-06-20: qty 2

## 2018-06-20 NOTE — ED Notes (Signed)
Gave food tray with juice. 

## 2018-06-20 NOTE — ED Notes (Signed)
BEHAVIORAL HEALTH ROUNDING Patient sleeping: No. Patient alert and oriented: yes Behavior appropriate: Yes.  ; If no, describe:  Nutrition and fluids offered: yes Toileting and hygiene offered: Yes  Sitter present: q15 minute observations and security  monitoring Law enforcement present: Yes  ODS  

## 2018-06-20 NOTE — ED Notes (Signed)
Woke pt up and walked to bathroom. Pt brushed her teeth, washed face, and put antiperspirant on. Changed pt's bedding out and picked up any trash in room.

## 2018-06-20 NOTE — Consult Note (Signed)
[facility] Face-to-Face Psychiatry Consult   Reason for Consult:  Psychosis Referring Physician:  Dr. Darnelle Catalan Patient Identification: Amy [name] MRN:  [id number] Principal Diagnosis: Cannabis abuse, continuous Diagnosis:  Principal Problem:   Cannabis abuse, continuous Active Problems:   Schizoaffective disorder, bipolar type (HCC)   Total Time spent with patient: 1.5 hours Patient seen, records review, collateral obtained from patient's mother, Jacki Cones [phone]) and act team worker Jonny Ruiz [phone]).  Discharge coordination completed with act team.  Subjective:  "I was feeling paranoid that the uber driver was going in the wrong direction".  HPI:  Amy [name] is a 23 y.o. female patient admitted with medical history as listed below who presents by EMS for evaluation of drug side effect.  She reports that she thinks that her marijuana was laced with something else, "some kind of downer".  History is limited but she says that she smoked a new batch of weed that may be a new strain.  Afterwards she felt very paranoid and down.  Now her symptoms have resolved and she just feels sleepy.  She has had no thoughts of hurting herself or anyone else.  She is not having hallucinations.  She denies chest pain, shortness of breath, nausea, vomiting, and abdominal pain.  Symptoms were acute in onset and lasted a few hours but have now resolved.  Nothing in particular made them better nor worse.  On evaluation, patient is resting comfortably in bed, she is calm and cooperative.  She arouses easily sits up and makes good eye contact.  She is wearing a facemask due to concern for germs.  Patient reports "I was out with my homeboys, and I smoked some weed I am not sure what they put in it. I attempted to take an uber home, however I got paranoid because I saw the red dot moving and I thought that he was trying to take me to a different place.  I tried to get out of the car, but then he turned around.  I  ended up having him take me to a gas station, where I called my mother and she told me to call 911, and then I was brought to the hospital."  Patient endorses that she has had hospitalizations multiple times in the past few months.  These have been associated with psychotic features when she smokes marijuana.  Patient is denying any auditory and visual hallucinations.  She does not appear to be responding to internal stimuli at this time.  Of note this patient is known by this Clinical research associate who has seen her in a psychotic state. Record review reveals that patient has not maintained time outside of hospitalization for more than 2 days since her discharge from [facility] on May 18, 2018.  She has since been hospitalized at [facility], [facility], and [facility] where she was most recently evaluated and released yesterday from the emergency department.  Per record review, patient was due to receive Abilify Maintenna 400 mg injection on June 06, 2018.  Per her act team coordinator, patient has not received this injection.  Patient reports she is taking no medications at this time, despite records that reveal that she was discharged with lithium and Prolixin from last hospitalization.  Of note, lithium levels are less than 0.6 on this admission.  Collateral from mother corroborates patient's story of events of last evening.  Mother states patient can come home, and asks for discharge coordination to be completed  with action team supervisor.   Past Psychiatric History: Schizoaffective disorder, bipolar type  Risk to Self:  Not current Risk to Others:  Not current Prior Inpatient Therapy:  Yes 3-4 hospitalizations since December 2019 as per HPI Prior Outpatient Therapy:  Has outpatient ACT  team  Past Medical History:  Past Medical History:  Diagnosis Date  . Cannabis abuse   . Depression   . 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 Schizo affective  schizophrenia (HCC)    History reviewed. No pertinent surgical history. Family History:  Family History  Problem Relation Age of Onset  . Mental illness Neg Hx    Family Psychiatric  History: Denies  Social History:  Social History   Substance and Sexual Activity  Alcohol Use Not Currently     Social History   Substance and Sexual Activity  Drug Use Yes  . Frequency: 7.0 times per week  . Types: Marijuana   Comment: smoked 1/2 blunt yesterday    Social History   Socioeconomic History  . Marital status: Single    Spouse name: Not on file  . Number of children: Not on file  . Years of education: 18  . Highest education level: 12th grade  Occupational History  . Occupation: Unemployed  Social Needs  . Financial resource strain: Not on file  . Food insecurity:    Worry: Not on file    Inability: Not on file  . Transportation needs:    Medical: Not on file    Non-medical: Not on file  Tobacco Use  . Smoking status: Current Every Day Smoker    Packs/day: 0.50    Types: Cigarettes  . Smokeless tobacco: Never Used  Substance and Sexual Activity  . Alcohol use: Not Currently  . Drug use: Yes    Frequency: 7.0 times per week    Types: Marijuana    Comment: smoked 1/2 blunt yesterday  . Sexual activity: Not Currently    Birth control/protection: None  Lifestyle  . Physical activity:    Days per week: Not on file    Minutes per session: Not on file  . Stress: Not on file  Relationships  . Social connections:    Talks on phone: Not on file    Gets together: Not on file    Attends religious service: Not on file    Active member of club or organization: Not on file    Attends meetings of clubs or organizations: Not on file    Relationship status: Not on file  Other Topics Concern  . Not on file  Social History Narrative   Pt is unemployed.  She stated that she lives in [place].  Due to altered mental status, difficult to obtain history.  Per previous  history, Pt lives with her parents.   Additional Social History: Lives with parents, however does not stay home.  Leaves the house as soon as she is discharged from hospital to "hang with the home voice and smoke weed." Patient is pending a guardian hearing on February 13.  Patient had initial hearing for guardianship on June 04, 2018, however judge requested a competency evaluation.  Patient has not been able to obtain this due to frequency and duration of hospitalizations, as well as not showing to act team appointments in order for competency hearing to be completed.    Allergies:   Allergies  Allergen Reactions  . Penicillins Rash    Has patient had a PCN reaction causing immediate rash, facial/tongue/throat  swelling, SOB or lightheadedness with hypotension: Yes Has patient had a PCN reaction causing severe rash involving mucus membranes or skin necrosis: No Has patient had a PCN reaction that required hospitalization: No Has patient had a PCN reaction occurring within the last 10 years: Yes If all of the above answers are "NO", then may   . Cogentin [Benztropine] Itching  . Divalproex Sodium Other (See Comments)    Creates feelings of paranoia, some suicidal feelings, and makes her feel that "people are coming after" her Creates feelings of paranoia, some suicidal feelings, and makes her feel that "people are coming after" her  . Risperidone Other (See Comments)    "Makes me cough"  . Valproic Acid Other (See Comments)    Creates paranoia/Per [facility] Creates paranoia/Per [facility]    Labs:  Results for orders placed or performed during the hospital encounter of 06/20/18 (from the past 48 hour(s))  Urine Drug Screen, Qualitative ([facility] only)     Status: Abnormal   Collection Time: 06/20/18  2:38 AM  Result Value Ref Range   Tricyclic, Ur Screen NONE DETECTED NONE DETECTED   Amphetamines, Ur Screen NONE DETECTED NONE DETECTED   MDMA (Ecstasy)Ur Screen NONE DETECTED  NONE DETECTED   Cocaine Metabolite,Ur [place] NONE DETECTED NONE DETECTED   Opiate, Ur Screen NONE DETECTED NONE DETECTED   Phencyclidine (PCP) Ur S NONE DETECTED NONE DETECTED   Cannabinoid 50 Ng, Ur  POSITIVE (A) NONE DETECTED   Barbiturates, Ur Screen NONE DETECTED NONE DETECTED   Benzodiazepine, Ur Scrn NONE DETECTED NONE DETECTED   Methadone Scn, Ur NONE DETECTED NONE DETECTED    Comment: (NOTE) Tricyclics + metabolites, urine    Cutoff 1000 ng/mL Amphetamines + metabolites, urine  Cutoff 1000 ng/mL MDMA (Ecstasy), urine              Cutoff 500 ng/mL Cocaine Metabolite, urine          Cutoff 300 ng/mL Opiate + metabolites, urine        Cutoff 300 ng/mL Phencyclidine (PCP), urine         Cutoff 25 ng/mL Cannabinoid, urine                 Cutoff 50 ng/mL Barbiturates + metabolites, urine  Cutoff 200 ng/mL Benzodiazepine, urine              Cutoff 200 ng/mL Methadone, urine                   Cutoff 300 ng/mL The urine drug screen provides only a preliminary, unconfirmed analytical test result and should not be used for non-medical purposes. Clinical consideration and professional judgment should be applied to any positive drug screen result due to possible interfering substances. A more specific alternate chemical method must be used in order to obtain a confirmed analytical result. Gas chromatography / mass spectrometry (GC/MS) is the preferred confirmat ory method. Performed at [facility], [address] [place], [place] [address]   Urinalysis, Complete w Microscopic     Status: Abnormal   Collection Time: 06/20/18  2:38 AM  Result Value Ref Range   Color, Urine YELLOW (A) YELLOW   APPearance HAZY (A) CLEAR   Specific Gravity, Urine 1.021 1.005 - 1.030   pH 5.0 5.0 - 8.0   Glucose, UA NEGATIVE NEGATIVE mg/dL   Hgb urine dipstick NEGATIVE NEGATIVE   Bilirubin Urine NEGATIVE NEGATIVE   Ketones, ur NEGATIVE NEGATIVE mg/dL   Protein, ur  30 (A) NEGATIVE mg/dL    Nitrite NEGATIVE NEGATIVE   Leukocytes, UA NEGATIVE NEGATIVE   RBC / HPF 11-20 0 - 5 RBC/hpf   WBC, UA 0-5 0 - 5 WBC/hpf   Bacteria, UA RARE (A) NONE SEEN   Squamous Epithelial / LPF 11-20 0 - 5   Mucus PRESENT    Hyaline Casts, UA PRESENT     Comment: Performed at [facility], [address], [place], [place] [address]  Pregnancy, urine POC     Status: None   Collection Time: 06/20/18  2:42 AM  Result Value Ref Range   Preg Test, Ur NEGATIVE NEGATIVE    Comment:        THE SENSITIVITY OF THIS METHODOLOGY IS >24 mIU/mL   Acetaminophen level     Status: Abnormal   Collection Time: 06/20/18  6:34 AM  Result Value Ref Range   Acetaminophen (Tylenol), Serum <10 (L) 10 - 30 ug/mL    Comment: (NOTE) Therapeutic concentrations vary significantly. A range of 10-30 ug/mL  may be an effective concentration for many patients. However, some  are best treated at concentrations outside of this range. Acetaminophen concentrations >150 ug/mL at 4 hours after ingestion  and >50 ug/mL at 12 hours after ingestion are often associated with  toxic reactions. Performed at [facility], [address] [place], [place] [address]   Comprehensive metabolic panel     Status: Abnormal   Collection Time: 06/20/18  6:34 AM  Result Value Ref Range   Sodium 138 135 - 145 mmol/L   Potassium 4.2 3.5 - 5.1 mmol/L   Chloride 107 98 - 111 mmol/L   CO2 26 22 - 32 mmol/L   Glucose, Bld 96 70 - 99 mg/dL   BUN 12 6 - 20 mg/dL   Creatinine, Ser 3.24 0.44 - 1.00 mg/dL   Calcium 8.9 8.9 - 40.1 mg/dL   Total Protein 7.0 6.5 - 8.1 g/dL   Albumin 4.0 3.5 - 5.0 g/dL   AST 12 (L) 15 - 41 U/L   ALT 10 0 - 44 U/L   Alkaline Phosphatase 43 38 - 126 U/L   Total Bilirubin 0.5 0.3 - 1.2 mg/dL   GFR calc non Af Amer >60 >60 mL/min   GFR calc Af Amer >60 >60 mL/min   Anion gap 5 5 - 15    Comment: Performed at [facility], [address] [place], [place] [address]  Ethanol     Status:  None   Collection Time: 06/20/18  6:34 AM  Result Value Ref Range   Alcohol, Ethyl (B) <10 <10 mg/dL    Comment: (NOTE) Lowest detectable limit for serum alcohol is 10 mg/dL. For medical purposes only. Performed at [facility], [address] [place], [place] [address]   Lipase, blood     Status: None   Collection Time: 06/20/18  6:34 AM  Result Value Ref Range   Lipase 26 11 - 51 U/L    Comment: Performed at [facility], [address] [place], [place] [address]  CBC with Differential     Status: Abnormal   Collection Time: 06/20/18  6:34 AM  Result Value Ref Range   WBC 9.8 4.0 - 10.5 K/uL   RBC 3.74 (L) 3.87 - 5.11 MIL/uL   Hemoglobin 12.3 12.0 - 15.0 g/dL   HCT 25.9 56.3 - 87.5 %   MCV 99.2 80.0 - 100.0 fL   MCH 32.9 26.0 - 34.0 pg  MCHC 33.2 30.0 - 36.0 g/dL   RDW 16.113.2 09.611.5 - 04.515.5 %   Platelets 261 150 - 400 K/uL   nRBC 0.0 0.0 - 0.2 %   Neutrophils Relative % 68 %   Neutro Abs 6.6 1.7 - 7.7 K/uL   Lymphocytes Relative 25 %   Lymphs Abs 2.5 0.7 - 4.0 K/uL   Monocytes Relative 7 %   Monocytes Absolute 0.6 0.1 - 1.0 K/uL   Eosinophils Relative 0 %   Eosinophils Absolute 0.0 0.0 - 0.5 K/uL   Basophils Relative 0 %   Basophils Absolute 0.0 0.0 - 0.1 K/uL   Immature Granulocytes 0 %   Abs Immature Granulocytes 0.02 0.00 - 0.07 K/uL    Comment: Performed at [facility], [address] [place], [place] [address]  Salicylate level     Status: None   Collection Time: 06/20/18  6:34 AM  Result Value Ref Range   Salicylate Lvl <7.0 2.8 - 30.0 mg/dL    Comment: Performed at [facility], [address] [place], [place] [address]  Lipid panel     Status: None   Collection Time: 06/20/18  6:34 AM  Result Value Ref Range   Cholesterol 125 0 - 200 mg/dL   Triglycerides 43 <829<150 mg/dL   HDL 51 >56>40 mg/dL   Total CHOL/HDL Ratio 2.5 RATIO   VLDL 9 0 - 40 mg/dL   LDL Cholesterol 65 0 - 99 mg/dL    Comment:        Total  Cholesterol/HDL:CHD Risk Coronary Heart Disease Risk Table                     Men   Women  1/2 Average Risk   3.4   3.3  Average Risk       5.0   4.4  2 X Average Risk   9.6   7.1  3 X Average Risk  23.4   11.0        Use the calculated Patient Ratio above and the CHD Risk Table to determine the patient's CHD Risk.        ATP III CLASSIFICATION (LDL):  <100     mg/dL   Optimal  [phone]  mg/dL   Near or Above                    Optimal  130-159  mg/dL   Borderline  [phone]  mg/dL   High  >629>190     mg/dL   Very High Performed at [facility], [address] [place], [place] [address]     No current facility-administered medications for this encounter.    Current Outpatient Medications  Medication Sig Dispense Refill  . amantadine (SYMMETREL) 100 MG capsule Take 1 capsule (100 mg total) by mouth 2 (two) times daily for 7 days. 14 capsule 0  . fluPHENAZine (PROLIXIN) 10 MG tablet Take 1 tablet (10 mg total) by mouth 3 (three) times daily. (Patient not taking: Reported on 06/04/2018) 90 tablet 2  . fluPHENAZine (PROLIXIN) 5 MG tablet Take 10 mg by mouth 3 (three) times daily.    Marland Kitchen. lithium carbonate (ESKALITH) 450 MG CR tablet Take 1 tablet (450 mg total) by mouth every 12 (twelve) hours. 60 tablet 2    Musculoskeletal: Strength & Muscle Tone: within normal limits Gait & Station: normal Patient leans: [name]  Psychiatric Specialty Exam: Physical Exam  Nursing note and vitals reviewed. Constitutional: She is  oriented to person, place, and time. She appears well-developed and well-nourished. No distress.  HENT:  Head: Normocephalic and atraumatic.  Eyes: Pupils are equal, round, and reactive to light. EOM are normal.  Neck: Normal range of motion.  Cardiovascular: Normal rate and regular rhythm.  Respiratory: Effort normal.  Musculoskeletal: Normal range of motion.  Neurological: She is alert and oriented to person, place, and time.  Skin: Skin is warm and dry.     Review of Systems  Constitutional: Negative.   HENT: Negative.   Respiratory: Negative.   Cardiovascular: Negative.   Musculoskeletal: Negative.   Neurological: Negative.   Psychiatric/Behavioral: Positive for depression and substance abuse (marijuana). Negative for hallucinations, memory loss and suicidal ideas. The patient is not nervous/anxious and does not have insomnia.     Blood pressure 139/78, pulse 91, temperature 98 F (36.7 C), temperature source Oral, resp. rate 18, height 6\' 1"  (1.854 m), weight 79.8 kg, last menstrual period 06/02/2018, SpO2 99 %.Body mass index is 23.22 kg/m.  General Appearance: Casual  Eye Contact:  Good  Speech:  Clear and Coherent and Normal Rate  Volume:  Normal  Mood:  Dysphoric  Affect:  Appropriate  Thought Process:  Goal Directed, Linear and Descriptions of Associations: Intact  Orientation:  Full (Time, Place, and Person)  Thought Content:  Logical and Hallucinations: None  Suicidal Thoughts:  No  Homicidal Thoughts:  No  Memory:  Good  Judgement:  Poor  Insight:  Shallow  Psychomotor Activity:  Normal  Concentration:  Concentration: Good  Recall:  Good  Fund of Knowledge:  Good  Language:  Good  Akathisia:  No  Handed:  Right  AIMS (if indicated):   0  Assets:  Housing Resilience Social Support  ADL's:  Intact  Cognition:  WNL  Sleep:   adequate     Treatment Plan Summary: Medication management and Plan Provide Abilify maintenna 400 mg IM today, and encourage patient to receive every 28 days.  Act team supervisor has arrived to the hospital for discharge patient.  He has been provided with paperwork from hospitalization.  Patient and supervisor are aware that patient should stay home this evening and attend the walk-in clinic for competency evaluation tomorrow.  Both acting supervisor and patient are agreeable with this plan of care.  She is able to contract for safety.  Disposition: No evidence of imminent risk to self or  others at present.   Patient does not meet criteria for psychiatric inpatient admission.    She was able to engage in safety planning including plan to return to nearest emergency department or contact emergency services if she feels unable to maintain her own safety or the safety of others. Patient had no further questions, comments, or concerns.  Discharge into care of act team supervisor, Jonny Ruiz, who agrees to maintain patient safety.    Mariel Craft, MD 06/20/2018 11:17 AM

## 2018-06-20 NOTE — Discharge Instructions (Addendum)
Please follow-up with the act team return for any further problems continue take your medicines

## 2018-06-20 NOTE — ED Notes (Signed)

## 2018-06-20 NOTE — ED Notes (Signed)
Sherilyn Cooter RN went to discharge the Pt and the Pt communicated to [facility] that she did not have anyone to come to pick her up until 8 AM. RN told the Pt that she will be discharged and that she can wait in the lobby for her ride. Immediately after being told that she had to wait in the lobby, the Pt stated that she was paranoid that people are out to get her and that she was feeling suicidal. Dr. Jeanene Erb was notified and a psychiatric consult was ordered. Pt was moved to the behavioral area of the ED.

## 2018-06-20 NOTE — ED Notes (Signed)
BEHAVIORAL HEALTH ROUNDING Patient sleeping: Yes.   Patient alert and oriented: eyes closed  Appears asleep Behavior appropriate: Yes.  ; If no, describe:  Nutrition and fluids offered: Yes  Toileting and hygiene offered: sleeping Sitter present: q 15 minute observations and security monitoring Law enforcement present: yes  ODS 

## 2018-06-20 NOTE — ED Provider Notes (Addendum)
[facility] Emergency Department Provider Note  ____________________________________________   First MD Initiated Contact with Patient 06/20/18 [phone]     (approximate)  I have reviewed the triage vital signs and the nursing notes.   HISTORY  Chief Complaint Drug / Alcohol Assessment    HPI Clydia Hedda SladeC Kallman is a 23 y.o. female with medical history as listed below who presents by EMS for evaluation of drug side effect.  She reports that she thinks that her marijuana was laced with something else, "some kind of downer".  History is limited but she says that she smoked a new batch of weed that may be a new strain.  Afterwards she felt very paranoid and down.  Now her symptoms have resolved and she just feels sleepy.  She has had no thoughts of hurting herself or anyone else.  She is not having hallucinations.  She denies chest pain, shortness of breath, nausea, vomiting, and abdominal pain.  Symptoms were acute in onset and lasted a few hours but have now resolved.  Nothing in particular made them better nor worse.  Past Medical History:  Diagnosis Date  . Cannabis abuse   . Depression   . 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 Schizo affective schizophrenia [facility](HCC)     Patient Active Problem List   Diagnosis Date Noted  . Schizoaffective psychosis (HCC) 05/18/2018  . 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 Substance induced mood disorder (HCC) 11/14/2017  . 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 Substance abuse (HCC)   . Cocaine abuse with cocaine-induced mood disorder (HCC) 12/16/2016  . Bipolar disorder, curr episode mixed, severe, with psychotic features (HCC) 10/25/2016  . Cocaine use disorder, mild, abuse (HCC) 10/25/2016  . Cannabis use disorder, severe, dependence (HCC) 10/25/2016  . Insomnia   . Anxiety state   . Cocaine abuse (HCC) 07/04/2015  . Cannabis abuse, continuous 07/02/2015  . Non compliance with medical  treatment 06/18/2015    History reviewed. No pertinent surgical history.  Prior to Admission medications   Medication Sig Start Date End Date Taking? Authorizing Provider  amantadine (SYMMETREL) 100 MG capsule Take 1 capsule (100 mg total) by mouth 2 (two) times daily for 7 days. 05/29/18 06/05/18  Malvin JohnsFarah, Brian, MD  fluPHENAZine (PROLIXIN) 10 MG tablet Take 1 tablet (10 mg total) by mouth 3 (three) times daily. Patient not taking: Reported on 06/04/2018 05/29/18   Malvin JohnsFarah, Brian, MD  fluPHENAZine (PROLIXIN) 5 MG tablet Take 10 mg by mouth 3 (three) times daily.    [provider]  lithium carbonate (ESKALITH) 450 MG CR tablet Take 1 tablet (450 mg total) by mouth every 12 (twelve) hours. 05/29/18   Malvin JohnsFarah, Brian, MD    Allergies Penicillins; Cogentin [benztropine]; Divalproex sodium; Risperidone; and Valproic acid  Family History  Problem Relation Age of Onset  . Mental illness Neg Hx     Social History Social History   Tobacco Use  . Smoking status: Current Every Day Smoker    Packs/day: 0.50    Types: Cigarettes  . Smokeless tobacco: Never Used  Substance Use Topics  . Alcohol use: Not Currently  . Drug use: Yes    Frequency: 7.0 times per week    Types: Marijuana    Comment: smoked 1/2 blunt yesterday    Review of Systems Constitutional: No fever/chills Eyes: No visual changes. ENT: No sore throat. Cardiovascular: Denies chest pain. Respiratory: Denies shortness of breath. Gastrointestinal: No abdominal pain.  No nausea,  no vomiting.  No diarrhea.  No constipation. Genitourinary: Negative for dysuria. Musculoskeletal: Negative for neck pain.  Negative for back pain. Integumentary: Negative for rash. Neurological: Negative for headaches, focal weakness or numbness. Psychiatric:Transient altered mental status after smoking marijuana  ____________________________________________   PHYSICAL EXAM:  ED Triage Vitals  Enc Vitals Group     BP 06/20/18 0235 (!)  149/74     Pulse Rate 06/20/18 0235 94     Resp --      Temp 06/20/18 0235 97.7 F (36.5 C)     Temp Source 06/20/18 0235 Oral     SpO2 06/20/18 0235 98 %     Weight 06/20/18 0234 79.8 kg (176 lb)     Height 06/20/18 0234 1.854 m (6\' 1" )     Head Circumference --      Peak Flow --      Pain Score 06/20/18 0234 0     Pain Loc --      Pain Edu? --      Excl. in GC? --      Constitutional: Alert and oriented. Well appearing and in no acute distress. Eyes: Conjunctivae are normal.  Head: Atraumatic. Nose: No congestion/rhinnorhea. Mouth/Throat: Mucous membranes are moist. Neck: No stridor.  No meningeal signs.   Cardiovascular: Normal rate, regular rhythm. Good peripheral circulation. Grossly normal heart sounds. Respiratory: Normal respiratory effort.  No retractions. Lungs CTAB. Gastrointestinal: Soft and nontender. No distention.  Musculoskeletal: No lower extremity tenderness nor edema. No gross deformities of extremities. Neurologic:  Normal speech and language. No gross focal neurologic deficits are appreciated.  Skin:  Skin is warm, dry and intact. No rash noted. Psychiatric: Mood and affect are somnolent but essentially normal. Speech and behavior are normal.  ____________________________________________   LABS (all labs ordered are listed, but only abnormal results are displayed)  Labs Reviewed  URINE DRUG SCREEN, QUALITATIVE ([facility] ONLY) - Abnormal; Notable for the following components:      Result Value   Cannabinoid 50 Ng, Ur [place] POSITIVE (*)    All other components within normal limits  URINALYSIS, COMPLETE (UACMP) WITH MICROSCOPIC - Abnormal; Notable for the following components:   Color, Urine YELLOW (*)    APPearance HAZY (*)    Protein, ur 30 (*)    Bacteria, UA RARE (*)    All other components within normal limits  POCT PREGNANCY, URINE   ____________________________________________  EKG  None - EKG not ordered by ED  physician ____________________________________________  RADIOLOGY   ED MD interpretation: No indication for imaging  Official radiology report(s): No results found.  ____________________________________________   PROCEDURES  Critical Care performed: No   Procedure(s) performed:   Procedures   ____________________________________________   INITIAL IMPRESSION / ASSESSMENT AND PLAN / ED COURSE  As part of my medical decision making, I reviewed the following data within the electronic MEDICAL RECORD NUMBER Nursing notes reviewed and incorporated, Labs reviewed , Old chart reviewed and Notes from prior ED visits, reviewed controlled substance database   Differential diagnosis includes, but is not limited to, substance-induced mood disorder, unspecified intoxication, accidental overdose.  The patient is back to baseline.  Her labs are unremarkable and her UDS is only positive for marijuana.  No way to know if she had another substance mixed in with marijuana, but she is in no distress, protecting her airway, and only sleepy at this time.  We will let her sleep and so she is clinically sober and not as somnolent and can be  discharged.  Clinical Course as of Jun 20 617  Thu Jun 20, 2018  0604 Patient has been sleeping and is in no distress.  I will discharge for outpatient follow-up.   [CF]  1610 Patient was awakened by nurse for discharge.  States she has no one to pick her up until 8:00-9:00, then states that she is paranoid and needs to talk to a psychiatrist.  A review of the medical record indicates that she was admitted to [facility] behavioral health about two weeks ago.  I will order a psych consult, but she does not meet criteria for IVC.   [CF]  0617 Now she has decided that she is having suicidal thoughts.  We are putting her in the behavioral section, but I am no putting her under IVC.  I believe this is mostly malingering and that she does not represent an immediate danger to  herself.  If she changes her mind and wants to leave, I believe that is appropriate.   [CF]    Clinical Course User Index [CF] Loleta Rose, MD    ____________________________________________  FINAL CLINICAL IMPRESSION(S) / ED DIAGNOSES  Final diagnoses:  Marijuana use  Paranoia (HCC)     MEDICATIONS GIVEN DURING THIS VISIT:  Medications - No data to display   ED Discharge Orders    None       Note:  This document was prepared using Dragon voice recognition software and may include unintentional dictation errors.   Loleta Rose, MD 06/20/18 [id number]    Loleta Rose, MD 06/20/18 [phone]

## 2018-06-20 NOTE — ED Notes (Signed)
ED BHU  Is the patient under IVC or is there intent for IVC:  No  Voluntary  Pt with paranoia r/t marijuana use  Is the patient medically cleared: Yes.   Is there vacancy in the ED BHU: Yes.   Is the population mix appropriate for patient: Yes.   Is the patient awaiting placement in inpatient or outpatient setting:  Has the patient had a psychiatric consult:  Consult pending   Survey of unit performed for contraband, proper placement and condition of furniture, tampering with fixtures in bathroom, shower, and each patient room: Yes.  ; Findings:  APPEARANCE/BEHAVIOR Calm and cooperative NEURO ASSESSMENT Orientation: oriented x3  Denies pain Hallucinations: No.None noted (Hallucinations) denies Speech: Normal Gait: normal RESPIRATORY ASSESSMENT Even  Unlabored respirations  CARDIOVASCULAR ASSESSMENT Pulses equal   regular rate  Skin warm and dry   GASTROINTESTINAL ASSESSMENT no GI complaint EXTREMITIES Full ROM  PLAN OF CARE Provide calm/safe environment. Vital signs assessed twice daily. ED BHU Assessment once each 12-hour shift. Collaborate with TTS daily or as condition indicates. Assure the ED provider has rounded once each shift. Provide and encourage hygiene. Provide redirection as needed. Assess for escalating behavior; address immediately and inform ED provider.  Assess family dynamic and appropriateness for visitation as needed: Yes.  ; If necessary, describe findings:  Educate the patient/family about BHU procedures/visitation: Yes.  ; If necessary, describe findings:
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
## 2018-06-20 NOTE — ED Notes (Signed)
Pt. States feeling very paranoid.  Pt. Stated "people are trying to kill me"  Pt. Asked "When did you start feeling suicidal?"  Pt. Stated when EMS picked me up.

## 2018-06-20 NOTE — ED Notes (Signed)
Patient observed lying in bed with eyes closed  Even, unlabored respirations observed   NAD pt appears to be sleeping  I will continue to monitor along with every 15 minute visual observations and ongoing security monitoring    

## 2018-06-20 NOTE — BH Assessment (Signed)
TTS received phone call from Jens Som with [facility] [employer] in response to call from Dr. Viviano Simas. Mr. Manson Passey states the patient did not receive the scheduled injectable medication on 06/06/2018.

## 2018-10-27 ENCOUNTER — Emergency Department (HOSPITAL_BASED_OUTPATIENT_CLINIC_OR_DEPARTMENT_OTHER)
Admission: EM | Admit: 2018-10-27 | Discharge: 2018-10-28 | Disposition: A | Discharge status: Home / Self Care | Payer: Self-pay | Attending: Emergency Medicine | Admitting: Emergency Medicine

## 2018-10-27 ENCOUNTER — Other Ambulatory Visit: Payer: Self-pay

## 2018-10-27 ENCOUNTER — Encounter (HOSPITAL_BASED_OUTPATIENT_CLINIC_OR_DEPARTMENT_OTHER): Payer: Self-pay

## 2018-10-27 DIAGNOSIS — F3010 Manic episode without psychotic symptoms, unspecified: Secondary | ICD-10-CM | POA: Insufficient documentation

## 2018-10-27 DIAGNOSIS — R5383 Other fatigue: Secondary | ICD-10-CM | POA: Insufficient documentation

## 2018-10-27 DIAGNOSIS — J45909 Unspecified asthma, uncomplicated: Secondary | ICD-10-CM | POA: Insufficient documentation

## 2018-10-27 DIAGNOSIS — R451 Restlessness and agitation: Secondary | ICD-10-CM | POA: Insufficient documentation

## 2018-10-27 DIAGNOSIS — F250 Schizoaffective disorder, bipolar type: Secondary | ICD-10-CM | POA: Insufficient documentation

## 2018-10-27 DIAGNOSIS — Z20828 Contact with and (suspected) exposure to other viral communicable diseases: Secondary | ICD-10-CM | POA: Insufficient documentation

## 2018-10-27 DIAGNOSIS — Z9114 Patient's other noncompliance with medication regimen: Secondary | ICD-10-CM | POA: Insufficient documentation

## 2018-10-27 DIAGNOSIS — Z79899 Other long term (current) drug therapy: Secondary | ICD-10-CM | POA: Insufficient documentation

## 2018-10-27 DIAGNOSIS — F17210 Nicotine dependence, cigarettes, uncomplicated: Secondary | ICD-10-CM | POA: Insufficient documentation

## 2018-10-27 DIAGNOSIS — N3000 Acute cystitis without hematuria: Secondary | ICD-10-CM | POA: Insufficient documentation

## 2018-10-27 DIAGNOSIS — R45850 Homicidal ideations: Secondary | ICD-10-CM | POA: Insufficient documentation

## 2018-10-27 DIAGNOSIS — F419 Anxiety disorder, unspecified: Secondary | ICD-10-CM | POA: Insufficient documentation

## 2018-10-27 HISTORY — DX: Unspecified asthma, uncomplicated: J45.909

## 2018-10-27 LAB — PREGNANCY, URINE: Preg Test, Ur: NEGATIVE

## 2018-10-27 LAB — RAPID URINE DRUG SCREEN, HOSP PERFORMED
Amphetamines: POSITIVE — AB
Barbiturates: NOT DETECTED
Benzodiazepines: NOT DETECTED
Cocaine: NOT DETECTED
Opiates: NOT DETECTED
Tetrahydrocannabinol: POSITIVE — AB
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
## 2018-10-27 NOTE — ED Notes (Signed)
EDP at bedside  

## 2018-10-27 NOTE — ED Triage Notes (Signed)
Pt noted to have rapid speech and flight of ideas. Pt fully denies mental health history despite it being listed in her health record.

## 2018-10-28 ENCOUNTER — Encounter: Payer: Self-pay | Admitting: Emergency Medicine

## 2018-10-28 ENCOUNTER — Inpatient Hospital Stay (HOSPITAL_COMMUNITY)
Admission: AD | Admit: 2018-10-28 | Discharge: 2018-10-31 | DRG: 885 | Disposition: A | Discharge status: Home / Self Care | Payer: No Typology Code available for payment source | Source: Intra-hospital | Attending: Psychiatry | Admitting: Psychiatry

## 2018-10-28 DIAGNOSIS — F17210 Nicotine dependence, cigarettes, uncomplicated: Secondary | ICD-10-CM | POA: Diagnosis present

## 2018-10-28 DIAGNOSIS — F209 Schizophrenia, unspecified: Secondary | ICD-10-CM | POA: Diagnosis present

## 2018-10-28 DIAGNOSIS — Z9114 Patient's other noncompliance with medication regimen: Secondary | ICD-10-CM | POA: Diagnosis not present

## 2018-10-28 DIAGNOSIS — G4700 Insomnia, unspecified: Secondary | ICD-10-CM | POA: Diagnosis present

## 2018-10-28 DIAGNOSIS — F250 Schizoaffective disorder, bipolar type: Principal | ICD-10-CM | POA: Diagnosis present

## 2018-10-28 LAB — URINALYSIS, ROUTINE W REFLEX MICROSCOPIC
Bilirubin Urine: NEGATIVE
Glucose, UA: NEGATIVE mg/dL
Hgb urine dipstick: NEGATIVE
Ketones, ur: NEGATIVE mg/dL
Leukocytes,Ua: NEGATIVE
Nitrite: POSITIVE — AB
Protein, ur: 100 mg/dL — AB
Specific Gravity, Urine: >1.03 — ABNORMAL HIGH (ref 1.005–1.030)
pH: 6 (ref 5.0–8.0)

## 2018-10-28 LAB — CBC WITH DIFFERENTIAL/PLATELET
Abs Immature Granulocytes: 0.08 10*3/uL — ABNORMAL HIGH (ref 0.00–0.07)
Basophils Absolute: 0 10*3/uL (ref 0.0–0.1)
Basophils Relative: 0 %
Eosinophils Absolute: 0 10*3/uL (ref 0.0–0.5)
Eosinophils Relative: 0 %
HCT: 36.8 % (ref 36.0–46.0)
Hemoglobin: 12 g/dL (ref 12.0–15.0)
Immature Granulocytes: 1 %
Lymphocytes Relative: 32 %
Lymphs Abs: 5 10*3/uL — ABNORMAL HIGH (ref 0.7–4.0)
MCH: 32.9 pg (ref 26.0–34.0)
MCHC: 32.6 g/dL (ref 30.0–36.0)
MCV: 100.8 fL — ABNORMAL HIGH (ref 80.0–100.0)
Monocytes Absolute: 1.1 10*3/uL — ABNORMAL HIGH (ref 0.1–1.0)
Monocytes Relative: 7 %
Neutro Abs: 9.5 10*3/uL — ABNORMAL HIGH (ref 1.7–7.7)
Neutrophils Relative %: 60 %
Platelets: 268 10*3/uL (ref 150–400)
RBC: 3.65 MIL/uL — ABNORMAL LOW (ref 3.87–5.11)
RDW: 13.4 % (ref 11.5–15.5)
WBC: 15.7 10*3/uL — ABNORMAL HIGH (ref 4.0–10.5)
nRBC: 0 % (ref 0.0–0.2)

## 2018-10-28 LAB — URINALYSIS, MICROSCOPIC (REFLEX)

## 2018-10-28 LAB — BASIC METABOLIC PANEL
Anion gap: 11 (ref 5–15)
BUN: 13 mg/dL (ref 6–20)
CO2: 20 mmol/L — ABNORMAL LOW (ref 22–32)
Calcium: 8.9 mg/dL (ref 8.9–10.3)
Chloride: 109 mmol/L (ref 98–111)
Creatinine, Ser: 1.04 mg/dL — ABNORMAL HIGH (ref 0.44–1.00)
GFR calc Af Amer: >60 mL/min (ref >60)
GFR calc non Af Amer: >60 mL/min (ref >60)
Glucose, Bld: 105 mg/dL — ABNORMAL HIGH (ref 70–99)
Potassium: 3.2 mmol/L — ABNORMAL LOW (ref 3.5–5.1)
Sodium: 140 mmol/L (ref 135–145)

## 2018-10-28 LAB — ETHANOL: Alcohol, Ethyl (B): <10 mg/dL (ref <10)

## 2018-10-28 LAB — SARS CORONAVIRUS 2 AG (30 MIN TAT): SARS Coronavirus 2 Ag: NEGATIVE

## 2018-10-28 MED ORDER — ZIPRASIDONE MESYLATE 20 MG IM SOLR
20.0000 mg | Freq: Once | INTRAMUSCULAR | Status: AC
  Administered 2018-10-28: 20 mg via INTRAMUSCULAR
  Filled 2018-10-28: qty 20

## 2018-10-28 MED ORDER — CEPHALEXIN 250 MG PO CAPS
500.0000 mg | ORAL_CAPSULE | Freq: Three times a day (TID) | ORAL | Status: DC
  Administered 2018-10-28 (×4): 500 mg via ORAL
  Filled 2018-10-28 (×4): qty 2

## 2018-10-28 MED ORDER — ACETAMINOPHEN 325 MG PO TABS: 650.0000 mg | ORAL_TABLET | Freq: Four times a day (QID) | ORAL | Status: DC | PRN

## 2018-10-28 MED ORDER — LORAZEPAM 2 MG/ML IJ SOLN: 1.0000 mg | Freq: Once | INTRAMUSCULAR | Status: DC | PRN

## 2018-10-28 MED ORDER — POTASSIUM CHLORIDE CRYS ER 20 MEQ PO TBCR
40.0000 meq | EXTENDED_RELEASE_TABLET | Freq: Once | ORAL | Status: DC
  Filled 2018-10-28: qty 2

## 2018-10-28 MED ORDER — TRAZODONE HCL 50 MG PO TABS
50.0000 mg | ORAL_TABLET | Freq: Every evening | ORAL | Status: DC | PRN
  Filled 2018-10-28: qty 2

## 2018-10-28 MED ORDER — MAGNESIUM HYDROXIDE 400 MG/5ML PO SUSP: 30.0000 mL | Freq: Every day | ORAL | Status: DC | PRN

## 2018-10-28 MED ORDER — POTASSIUM CHLORIDE CRYS ER 20 MEQ PO TBCR
40.0000 meq | EXTENDED_RELEASE_TABLET | Freq: Once | ORAL | Status: AC
  Administered 2018-10-28: 40 meq via ORAL
  Filled 2018-10-28: qty 2

## 2018-10-28 MED ORDER — DIPHENHYDRAMINE HCL 50 MG/ML IJ SOLN: 50.0000 mg | Freq: Once | INTRAMUSCULAR | Status: DC | PRN

## 2018-10-28 MED ORDER — CEPHALEXIN 500 MG PO CAPS
500.0000 mg | ORAL_CAPSULE | Freq: Three times a day (TID) | ORAL | Status: DC
  Administered 2018-10-29 – 2018-10-31 (×7): 500 mg via ORAL
  Filled 2018-10-28 (×9): qty 1
  Filled 2018-10-28: qty 2
  Filled 2018-10-28 (×4): qty 1

## 2018-10-28 MED ORDER — HYDROXYZINE HCL 25 MG PO TABS: 25.0000 mg | ORAL_TABLET | Freq: Three times a day (TID) | ORAL | Status: DC | PRN

## 2018-10-28 MED ORDER — ALUM & MAG HYDROXIDE-SIMETH 200-200-20 MG/5ML PO SUSP: 30.0000 mL | ORAL | Status: DC | PRN

## 2018-10-28 MED ORDER — LORAZEPAM 1 MG PO TABS
1.0000 mg | ORAL_TABLET | Freq: Four times a day (QID) | ORAL | Status: DC | PRN
  Filled 2018-10-28: qty 1

## 2018-10-28 MED ORDER — LORAZEPAM 2 MG/ML IJ SOLN
INTRAMUSCULAR | Status: AC
  Filled 2018-10-28: qty 1

## 2018-10-28 MED ORDER — HALOPERIDOL LACTATE 5 MG/ML IJ SOLN: 5.0000 mg | Freq: Once | INTRAMUSCULAR | Status: DC | PRN

## 2018-10-28 MED ORDER — LORAZEPAM 2 MG/ML IJ SOLN
2.0000 mg | Freq: Once | INTRAMUSCULAR | Status: AC
  Administered 2018-10-28: 2 mg via INTRAVENOUS

## 2018-10-28 NOTE — BH Assessment (Signed)
[facility] Assessment Progress Note   TTS has attempted to contact patient's mother three additional times, no answer as well as her aunt who is listed as an Emergency Contact, no answer.  TTS spoke to NP who still wants to get collateral information due to patient's presentation this morning being so different than what it was last night.  She did not present as being psychotic this morning.

## 2018-10-28 NOTE — ED Notes (Signed)
Pt moved to room 12, room secured and placed in burgundy scrubs. Pt remains lethargic, too sedated to carry on conversation.

## 2018-10-28 NOTE — ED Notes (Signed)
TTS talking to pt. 

## 2018-10-28 NOTE — ED Notes (Signed)
Call to check status  Of sitter  Was told one was not available to come to [facility]

## 2018-10-28 NOTE — ED Notes (Signed)
Pt asking about discharge.  Gave updated information about waiting for [facility].  Pt given something to eat and drink.  Pt appropriate, answers questions and no aggressive behavior noted at this time.

## 2018-10-28 NOTE — ED Notes (Signed)
Too sedated for TTS or oral meds at this time. Will continue to monitor.

## 2018-10-28 NOTE — ED Notes (Signed)
Notified Ted in lab to add on urinalysis

## 2018-10-28 NOTE — ED Notes (Signed)
Verified with staffing that they are aware for need of sitter, per Dominica Severin pt is on the list however sitter is likely unavailable.

## 2018-10-28 NOTE — ED Notes (Signed)
Joneen Boers- pt friend who she lives with- [phone]

## 2018-10-28 NOTE — ED Notes (Addendum)
Pt asking for updates regarding dispo.  Pt has been calm for most of shift.  Pt did get angry at one point but was able to calm down with some verbal interaction.  Pt given lunch.  She is appropriate at this time.  Denies SI or HI.  Pt speaking in complete sentences.  No abnormal though patterns noted at this time.  Pt gave phone number of Joneen Boers (lives with patient)  [phone].  Called [facility], Danny, and gave info.

## 2018-10-28 NOTE — ED Notes (Addendum)
Police and security at bedside. Dr. Dina Rich notified patient of IVC at this time. Pt suspicious of staff, states that Elenore Rota and Josiah Lobo Trump told her to refuse blood tests so she could go to [place] in August. Pt raised voice at staff members, actively refusing medications and treatment. Police unable to redirect. Pt states that she is not manic, her blood is too pure to be taken and she does not want to get "the [facility]" by getting blood taken. Pt required brief physical hold by staff to administer meds (see EMAR). Pt screaming in room. Police handcuffed patient bilaterally to stretcher, remain at bedside. Will continue to monitor.
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
## 2018-10-28 NOTE — ED Notes (Signed)
Pt remains asleep

## 2018-10-28 NOTE — ED Notes (Signed)
Pt refusing blood draw at this time. Pt states "I can only have my blood drawn by [place]. I don't know what your lab will do with it. When they see that it is equal and mixed with extraterrestrial blood, they will start to ask questions." EDP made aware. Orders received to make patient IVC.

## 2018-10-28 NOTE — ED Notes (Signed)
Tried to take vital signs on pt after law enforcement returned the patient to the building, pt refused.

## 2018-10-28 NOTE — ED Notes (Addendum)
Pt made phone call to Dory Larsen,  [place] person that she was pregnant   ,  Got very upset over what the person she was talking to said  Would not give moms phone number to [facility] to bathroom when rn removed paper towel holder pt slam door, but had calmed down when returned to room

## 2018-10-28 NOTE — ED Notes (Signed)
Attempted to call report x2 no answer 

## 2018-10-28 NOTE — BH Assessment (Signed)
Received TTS consult request. Pt was given medication and is sleeping. She is unable to participate in assessment at this time. TTS will assess Pt when she is awake.   Evelena Peat, [facility], [facility], [facility] Triage Specialist [phone]

## 2018-10-28 NOTE — ED Notes (Addendum)
[employer] meal, popcorn, granola bar, and coke given per pt request. Offered bathroom to patient at this time.

## 2018-10-28 NOTE — ED Notes (Signed)
Per danny at [place] waiting to talk to np to make a decision on pt
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
## 2018-10-28 NOTE — ED Notes (Signed)
Pt requesting popcorn and coke. Both provided to patient. Stayed with patient while she ate/drank. Calm at present.
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
## 2018-10-28 NOTE — ED Notes (Signed)
Remains too sedated for TTS, oral medication administration. Will reorder potassium for later in the day

## 2018-10-28 NOTE — ED Notes (Signed)
Pt appearing much calmer at this time. Allowed RN to obtain blood for labs.
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
## 2018-10-28 NOTE — BH Assessment (Signed)
[place] Assessment Progress Note   Patient's disposition is pending until collateral information can be obtained.

## 2018-10-28 NOTE — ED Notes (Signed)
Restraints removed at this time. Pt sleeping.
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
## 2018-10-29 ENCOUNTER — Encounter (HOSPITAL_COMMUNITY): Payer: Self-pay

## 2018-10-29 DIAGNOSIS — F250 Schizoaffective disorder, bipolar type: Principal | ICD-10-CM

## 2018-10-29 LAB — URINE CULTURE: Culture: >=10000 — AB

## 2018-10-29 LAB — LIPID PANEL
Cholesterol: 112 mg/dL (ref 0–200)
HDL: 45 mg/dL (ref >40)
LDL Cholesterol: 55 mg/dL (ref 0–99)
Total CHOL/HDL Ratio: 2.5 RATIO
Triglycerides: 59 mg/dL (ref <150)
VLDL: 12 mg/dL (ref 0–40)

## 2018-10-29 LAB — HEMOGLOBIN A1C
Hgb A1c MFr Bld: 4.7 % — ABNORMAL LOW (ref 4.8–5.6)
Mean Plasma Glucose: 88.19 mg/dL

## 2018-10-29 LAB — TSH: TSH: 0.188 u[IU]/mL — ABNORMAL LOW (ref 0.350–4.500)

## 2018-10-29 MED ORDER — PRENATAL MULTIVITAMIN CH
1.0000 | ORAL_TABLET | Freq: Every day | ORAL | Status: DC
  Administered 2018-10-30 – 2018-10-31 (×2): 1 via ORAL
  Filled 2018-10-29 (×5): qty 1

## 2018-10-29 MED ORDER — ARIPIPRAZOLE ER 400 MG IM SRER
400.0000 mg | INTRAMUSCULAR | Status: DC
  Administered 2018-10-29: 400 mg via INTRAMUSCULAR

## 2018-10-29 NOTE — Progress Notes (Signed)
Recreation Therapy Notes    Date: 10/29/2018 Time: 10:00 am Location: 500 hall   Group Topic: Life Goal Planning  Goal Area(s) Addresses:  Patient will work on Radio producer on  Catering manager. Patient will follow directions on first prompt.  Behavioral Response: Appropriate  Intervention: Worksheet  Activity:  Staff on [employer] were provided with a worksheet on Catering manager . Staff was instructed to give it to the patients and have them work on it in place of [place]. Staff was also given 2 coloring sheets and 2 word searches and were given the option to give them out.  Education: Ability to follow Directions, Change of thought processes Discharge Planning.   Education Outcome: Acknowledges education/In group clarification offered  Clinical Observations/Feedback: . Due to COVID-19,guidelines group was not held.Group members were provided a learning activity worksheet to work on the topic and above-stated goals. LRT is available to answer any questions patient may have regarding the worksheet.  Tomi Likens, LRT/CTRS       Ranee Peasley L Temperance Kelemen 10/29/2018 2:46 PM
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
## 2018-10-29 NOTE — Progress Notes (Signed)
Nursing Progress Note: 7p-7a D: Pt currently presents with a pleasant/pressured/flight of ideas/loose associations/delusions/derealiaztions affect and behavior. Interacting appropriately with the milieu. Pt reports good sleep during the previous night with current medication regimen.  A: Pt provided with medications per providers orders. Pt's labs and vitals were monitored throughout the night. Pt supported emotionally and encouraged to express concerns and questions. Pt educated on medications.  R: Pt's safety ensured with 15 minute and environmental checks. Pt currently denies SI, HI, and AVH. Pt verbally contracts to seek staff if SI,HI, or AVH occurs and to consult with staff before acting on any harmful thoughts. Will continue to monitor.   [place] NOVEL CORONAVIRUS (COVID-19) DAILY CHECK-OFF SYMPTOMS - answer yes or no to each - every day NO YES  Have you had a fever in the past 24 hours?  . Fever (Temp > 37.80C / 100F) X   Have you had any of these symptoms in the past 24 hours? . New Cough .  Sore Throat  .  Shortness of Breath .  Difficulty Breathing .  Unexplained Body Aches   X   Have you had any one of these symptoms in the past 24 hours not related to allergies?   . Runny Nose .  Nasal Congestion .  Sneezing   X   If you have had runny nose, nasal congestion, sneezing in the past 24 hours, has it worsened?  X   EXPOSURES - check yes or no X   Have you traveled outside the state in the past 14 days?  X   Have you been in contact with someone with a confirmed diagnosis of COVID-19 or PUI in the past 14 days without wearing appropriate PPE?  X   Have you been living in the same home as a person with confirmed diagnosis of COVID-19 or a PUI (household contact)?    X   Have you been diagnosed with COVID-19?    X              What to do next: Answered NO to all: Answered YES to anything:   Proceed with unit schedule Follow the [facility] Inpatient Flowsheet.

## 2018-10-29 NOTE — [facility] Suicide Risk Assessment (Signed)
[facility] INPATIENT:  Family/Significant Other Suicide Prevention Education  Suicide Prevention Education:  Patient Refusal for Family/Significant Other Suicide Prevention Education: The patient Amy [name] has refused to provide written consent for family/significant other to be provided Family/Significant Other Suicide Prevention Education during admission and/or prior to discharge.  Physician notified.  [name] 10/29/2018, 1:53 PM

## 2018-10-29 NOTE — B[facility] Counselor (Signed)
CSW went into pt's room for assessment. Pt stated, "I will not be here long. Can we do it later?" before pulling the covers back over her head.

## 2018-10-29 NOTE — Progress Notes (Signed)
Patient presents with delusional/woried/paranoid affect and behavior during admission interview and assessment. VS monitored and recorded. Skin check performed with [name] MHT and revealed multiple tattoos. Contraband was not found. Patient was oriented to unit and schedule. Pt states "I am pregnant. I don't care what anyone says". Pt denies SI/HI/AVH at this time. PO fluids provided. Safety maintained. Rest encouraged.
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
## 2018-10-30 MED ORDER — FLUPHENAZINE HCL 5 MG PO TABS
5.0000 mg | ORAL_TABLET | Freq: Two times a day (BID) | ORAL | Status: DC
  Administered 2018-10-30 – 2018-10-31 (×3): 5 mg via ORAL
  Filled 2018-10-30 (×8): qty 1

## 2018-10-30 MED ORDER — AMANTADINE HCL 100 MG PO CAPS
100.0000 mg | ORAL_CAPSULE | Freq: Two times a day (BID) | ORAL | Status: DC
  Administered 2018-10-30 – 2018-10-31 (×3): 100 mg via ORAL
  Filled 2018-10-30: qty 14
  Filled 2018-10-30 (×3): qty 1
  Filled 2018-10-30: qty 14
  Filled 2018-10-30 (×5): qty 1

## 2018-10-30 NOTE — Progress Notes (Signed)
D:  Patient denied SI and HI, contracts for safety.  Denied A/V hallucinations.  Denied pain. A:  Medications administered per MD orders.  Emotional support and encouragement given patient. R:  Safety maintained with 15 minute checks. Patient stayed in her room most of the day.

## 2018-10-30 NOTE — Tx Team (Signed)
Interdisciplinary Treatment and Diagnostic Plan Update  10/30/2018 Time of Session: 09:12am Amy [name] MRN: [id number]  Principal Diagnosis: <principal problem not specified>  Secondary Diagnoses: Active Problems:   Schizophrenia ([place])   Current Medications:  Current Facility-Administered Medications  Medication Dose Route Frequency Provider Last Rate Last Dose  . acetaminophen (TYLENOL) tablet 650 mg  650 mg Oral Q6H PRN Rozetta Nunnery, NP      . alum & mag hydroxide-simeth (MAALOX/MYLANTA) 200-200-20 MG/5ML suspension 30 mL  30 mL Oral Q4H PRN Lindon Romp A, NP      . amantadine (SYMMETREL) capsule 100 mg  100 mg Oral BID Johnn Hai, MD      . ARIPiprazole ER (ABILIFY MAINTENA) injection 400 mg  400 mg Intramuscular Q28 days Johnn Hai, MD   400 mg at 10/29/18 1103  . cephALEXin (KEFLEX) capsule 500 mg  500 mg Oral Q8H Lindon Romp A, NP   500 mg at 10/30/18 0936  . haloperidol lactate (HALDOL) injection 5 mg  5 mg Intramuscular Once PRN Lindon Romp A, NP       And  . diphenhydrAMINE (BENADRYL) injection 50 mg  50 mg Intramuscular Once PRN Rozetta Nunnery, NP       And  . LORazepam (ATIVAN) injection 1 mg  1 mg Intramuscular Once PRN Lindon Romp A, NP      . fluPHENAZine (PROLIXIN) tablet 5 mg  5 mg Oral BID Johnn Hai, MD      . hydrOXYzine (ATARAX/VISTARIL) tablet 25 mg  25 mg Oral TID PRN Lindon Romp A, NP      . magnesium hydroxide (MILK OF MAGNESIA) suspension 30 mL  30 mL Oral Daily PRN Lindon Romp A, NP      . prenatal multivitamin tablet 1 tablet  1 tablet Oral Daily Johnn Hai, MD   1 tablet at 10/30/18 0932  . traZODone (DESYREL) tablet 50 mg  50 mg Oral QHS PRN Rozetta Nunnery, NP       PTA Medications: Medications Prior to Admission  Medication Sig Dispense Refill Last Dose  . fluPHENAZine (PROLIXIN) 5 MG tablet Take 10 mg by mouth 3 (three) times daily.   06/03/2018 at Unknown time  . amantadine (SYMMETREL) 100 MG capsule Take 1 capsule (100 mg total)  by mouth 2 (two) times daily for 7 days. 14 capsule 0 06/03/2018 at Unknown time  . fluPHENAZine (PROLIXIN) 10 MG tablet Take 1 tablet (10 mg total) by mouth 3 (three) times daily. (Patient not taking: Reported on 06/04/2018) 90 tablet 2 Not Taking at Unknown time  . lithium carbonate (ESKALITH) 450 MG CR tablet Take 1 tablet (450 mg total) by mouth every 12 (twelve) hours. 60 tablet 2 06/03/2018 at Unknown time    Patient Stressors:    Patient Strengths:    Treatment Modalities: Medication Management, Group therapy, Case management,  1 to 1 session with clinician, Psychoeducation, Recreational therapy.   Physician Treatment Plan for Primary Diagnosis: <principal problem not specified> Long Term Goal(s): Improvement in symptoms so as ready for discharge Improvement in symptoms so as ready for discharge   Short Term Goals: Ability to demonstrate self-control will improve Ability to identify and develop effective coping behaviors will improve Compliance with prescribed medications will improve Ability to identify and develop effective coping behaviors will improve Ability to maintain clinical measurements within normal limits will improve  Medication Management: Evaluate patient's response, side effects, and tolerance of medication regimen.  Therapeutic Interventions: 1 to 1 sessions, Unit  Group sessions and Medication administration.  Evaluation of Outcomes: Progressing  Physician Treatment Plan for Secondary Diagnosis: Active Problems:   Schizophrenia (HCC)  Long Term Goal(s): Improvement in symptoms so as ready for discharge Improvement in symptoms so as ready for discharge   Short Term Goals: Ability to demonstrate self-control will improve Ability to identify and develop effective coping behaviors will improve Compliance with prescribed medications will improve Ability to identify and develop effective coping behaviors will improve Ability to maintain clinical measurements  within normal limits will improve     Medication Management: Evaluate patient's response, side effects, and tolerance of medication regimen.  Therapeutic Interventions: 1 to 1 sessions, Unit Group sessions and Medication administration.  Evaluation of Outcomes: Progressing   RN Treatment Plan for Primary Diagnosis: <principal problem not specified> Long Term Goal(s): Knowledge of disease and therapeutic regimen to maintain health will improve  Short Term Goals: Ability to participate in decision making will improve, Ability to verbalize feelings will improve, Ability to disclose and discuss suicidal ideas, Ability to identify and develop effective coping behaviors will improve and Compliance with prescribed medications will improve  Medication Management: RN will administer medications as ordered by provider, will assess and evaluate patient's response and provide education to patient for prescribed medication. RN will report any adverse and/or side effects to prescribing provider.  Therapeutic Interventions: 1 on 1 counseling sessions, Psychoeducation, Medication administration, Evaluate responses to treatment, Monitor vital signs and CBGs as ordered, Perform/monitor CIWA, COWS, AIMS and Fall Risk screenings as ordered, Perform wound care treatments as ordered.  Evaluation of Outcomes: Progressing   LCSW Treatment Plan for Primary Diagnosis: <principal problem not specified> Long Term Goal(s): Safe transition to appropriate next level of care at discharge, Engage patient in therapeutic group addressing interpersonal concerns.  Short Term Goals: Engage patient in aftercare planning with referrals and resources and Increase skills for wellness and recovery  Therapeutic Interventions: Assess for all discharge needs, 1 to 1 time with Social worker, Explore available resources and support systems, Assess for adequacy in community support network, Educate family and significant other(s) on  suicide prevention, Complete Psychosocial Assessment, Interpersonal group therapy.  Evaluation of Outcomes: Progressing   Progress in Treatment: Attending groups: No. Participating in groups: No. Taking medication as prescribed: Yes. Toleration medication: Yes. Family/Significant other contact made: No, will contact:  pt denied; spe with pt Patient understands diagnosis: No. Discussing patient identified problems/goals with staff: Yes. Medical problems stabilized or resolved: Yes. Denies suicidal/homicidal ideation: Yes. Issues/concerns per patient self-inventory: No. Other:   New problem(s) identified: No, Describe:  None  New Short Term/Long Term Goal(s):  Patient Goals:  "No goal. I just want to get better"  Discharge Plan or Barriers:   Reason for Continuation of Hospitalization: Delusions  Medication stabilization  Estimated Length of Stay: 3-5 days  Attendees: Patient: Amy [name] 10/30/2018   Physician: Dr. Malvin JohnsBrian Farah, MD 10/30/2018  Nursing: Meriam SpragueBeverly, RN 10/30/2018   RN Care Manager: 10/30/2018   Social Worker: Stephannie PetersJasmine Soliana Kitko, LCSW 10/30/2018   Recreational Therapist:  10/30/2018   Other:  10/30/2018   Other:  10/30/2018   Other: 10/30/2018     Scribe for Treatment Team: Delphia GratesJasmine M Raekwan Spelman, LCSW 10/30/2018 10:57 AM

## 2018-10-30 NOTE — Plan of Care (Signed)
Nurse discussed anxiety and coping skills with patient. 

## 2018-10-30 NOTE — Progress Notes (Signed)
[facility]  10/30/2018 9:56 AM Amy [name]  MRN:  [id number] Subjective:    Patient's acute methamphetamine induced psychosis seems to have resolved but her underlying schizoaffective is still problematic.  She insists to examiners that she is still pregnant and is not reassured by negative pregnancy tests.  She states "I need to leave here because I am pregnant" but she is redirectable and she did get her long-acting injectable.  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 Asthma   . Cannabis abuse   . Depression   . 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 Schizo affective schizophrenia ([facility])    History reviewed. No pertinent surgical history. Family History:  Family History  Problem Relation Age of Onset  . Mental illness Neg Hx     Social History:  Social History   Substance and Sexual Activity  Alcohol Use Not Currently     Social History   Substance and Sexual Activity  Drug Use Not Currently  . Frequency: 7.0 times per week  . Types: Marijuana   Comment: last use May 2020    Social History   Socioeconomic History  . Marital status: Single    Spouse name: Not on file  . Number of children: Not on file  . Years of education: 612  . Highest education level: 12th grade  Occupational History  . Occupation: Unemployed  Social Needs  . Financial resource strain: Not on file  . Food insecurity:    Worry: Not on file    Inability: Not on file  . Transportation needs:    Medical: Not on file    Non-medical: Not on file  Tobacco Use  . Smoking status: Current Some Day Smoker    Packs/day: 0.50    Types: Cigarettes  . Smokeless tobacco: Never  Used  Substance and Sexual Activity  . Alcohol use: Not Currently  . Drug use: Not Currently    Frequency: 7.0 times per week    Types: Marijuana    Comment: last use May 2020  . Sexual activity: Not Currently    Birth control/protection: None  Lifestyle  . Physical activity:    Days per week: Not on file    Minutes per session: Not on file  . Stress: Not on file  Relationships  . Social connections:    Talks on phone: Not on file    Gets together: Not on file    Attends religious service: Not on file    Active member of club or organization: Not on file    Attends meetings of clubs or organizations: Not on file    Relationship status: Not on file  Other Topics Concern  . Not on file  Social History Narrative   Pt is unemployed.  She stated that she lives in [place].  Due to altered mental status, difficult to obtain history.  Per previous history, Pt lives with her parents.   Additional Social History:                         Sleep: good  Appetite:  Good  Current Medications: Current Facility-Administered Medications  Medication Dose Route Frequency Provider  Last Rate Last Dose  . acetaminophen (TYLENOL) tablet 650 mg  650 mg Oral Q6H PRN Jackelyn PolingBerry, Jason A, NP      . alum & mag hydroxide-simeth (MAALOX/MYLANTA) 200-200-20 MG/5ML suspension 30 mL  30 mL Oral Q4H PRN Nira ConnBerry, Jason A, NP      . amantadine (SYMMETREL) capsule 100 mg  100 mg Oral BID Malvin JohnsFarah, Ethylene Reznick, MD      . ARIPiprazole ER (ABILIFY MAINTENA) injection 400 mg  400 mg Intramuscular Q28 days Malvin JohnsFarah, Jonael Paradiso, MD   400 mg at 10/29/18 1103  . cephALEXin (KEFLEX) capsule 500 mg  500 mg Oral Q8H Nira ConnBerry, Jason A, NP   500 mg at 10/30/18 0936  . haloperidol lactate (HALDOL) injection 5 mg  5 mg Intramuscular Once PRN Nira ConnBerry, Jason A, NP       And  . diphenhydrAMINE (BENADRYL) injection 50 mg  50 mg Intramuscular Once PRN Jackelyn PolingBerry, Jason A, NP       And  . LORazepam (ATIVAN) injection 1 mg  1 mg Intramuscular  Once PRN Nira ConnBerry, Jason A, NP      . fluPHENAZine (PROLIXIN) tablet 5 mg  5 mg Oral BID Malvin JohnsFarah, Malosi Hemstreet, MD      . hydrOXYzine (ATARAX/VISTARIL) tablet 25 mg  25 mg Oral TID PRN Nira ConnBerry, Jason A, NP      . magnesium hydroxide (MILK OF MAGNESIA) suspension 30 mL  30 mL Oral Daily PRN Nira ConnBerry, Jason A, NP      . prenatal multivitamin tablet 1 tablet  1 tablet Oral Daily Malvin JohnsFarah, Rojelio Uhrich, MD   1 tablet at 10/30/18 0932  . traZODone (DESYREL) tablet 50 mg  50 mg Oral QHS PRN Jackelyn PolingBerry, Jason A, NP        Lab Results:  Results for orders placed or performed during the hospital encounter of 10/28/18 (from the past 48 hour(s))  Hemoglobin A1c     Status: Abnormal   Collection Time: 10/29/18  6:12 PM  Result Value Ref Range   Hgb A1c MFr Bld 4.7 (L) 4.8 - 5.6 %    Comment: (NOTE) Pre diabetes:          5.7%-6.4% Diabetes:              >6.4% Glycemic control for   <7.0% adults with diabetes    Mean Plasma Glucose 88.19 mg/dL    Comment: Performed at [facility], [address] [place], [place] [address]  Lipid panel     Status: None   Collection Time: 10/29/18  6:12 PM  Result Value Ref Range   Cholesterol 112 0 - 200 mg/dL   Triglycerides 59 <213<150 mg/dL   HDL 45 >08>40 mg/dL   Total CHOL/HDL Ratio 2.5 RATIO   VLDL 12 0 - 40 mg/dL   LDL Cholesterol 55 0 - 99 mg/dL    Comment:        Total Cholesterol/HDL:CHD Risk Coronary Heart Disease Risk Table                     Men   Women  1/2 Average Risk   3.4   3.3  Average Risk       5.0   4.4  2 X Average Risk   9.6   7.1  3 X Average Risk  23.4   11.0        Use the calculated Patient Ratio above and the CHD Risk Table to determine the patient's CHD Risk.  ATP III CLASSIFICATION (LDL):  <100     mg/dL   Optimal  100-129  mg/dL   Near or Above                    Optimal  130-159  mg/dL   Borderline  160-189  mg/dL   High  >190     mg/dL   Very High Performed at [facility] [address], [place], [place] [address]    TSH     Status: Abnormal   Collection Time: 10/29/18  6:12 PM  Result Value Ref Range   TSH 0.188 (L) 0.350 - 4.500 uIU/mL    Comment: Performed by a 3rd Generation assay with a functional sensitivity of <=0.01 uIU/mL. Performed at [facility], [place] [address] [place],  [address]     Blood Alcohol level:  Lab Results  Component Value Date   [facility] <10 10/28/2018   ETH <10 37/02/6268    Metabolic Disorder Labs: Lab Results  Component Value Date   HGBA1C 4.7 (L) 10/29/2018   MPG 88.19 10/29/2018   MPG 96.8 06/20/2018   Lab Results  Component Value Date   PROLACTIN 33.3 (H) 10/25/2016   PROLACTIN 66.9 (H) 12/03/2015   Lab Results  Component Value Date   CHOL 112 10/29/2018   TRIG 59 10/29/2018   HDL 45 10/29/2018   CHOLHDL 2.5 10/29/2018   VLDL 12 10/29/2018   LDLCALC 55 10/29/2018   LDLCALC 65 06/20/2018    Physical Findings: AIMS:  , ,  ,  ,    CIWA:    COWS:     Musculoskeletal: Strength & Muscle Tone: within normal limits Gait & Station: normal Patient leans: [name]  Psychiatric Specialty Exam: Physical Exam  ROS  Blood pressure (!) 124/58, pulse 86, temperature 98.6 F (37 C), temperature source Oral, resp. rate 18, height 6\' 1"  (1.854 m), weight 75.6 kg, SpO2 100 %, unknown if currently breastfeeding.Body mass index is 21.99 kg/m.  General Appearance: Casual  Eye Contact:  Good  Speech:  Clear and Coherent  Volume:  Normal  Mood:  hypomanic or asleep  Affect:  Congruent  Thought Process:  Goal Directed and Descriptions of Associations: Loose  Orientation:  Full (Time, Place, and Person)  Thought Content:  Delusions  Suicidal Thoughts:  No  Homicidal Thoughts:  No  Memory:  Immediate;   Fair  Judgement:  Impaired  Insight:  Lacking  Psychomotor Activity:  Normal  Concentration:  Concentration: Fair  Recall:  [employer] of Knowledge:  Fair  Language:  Fair  Akathisia:  Negative  Handed:  Right  AIMS (if indicated):      Assets:  Leisure Time Physical Health  ADL's:  Intact  Cognition:  WNL  Sleep:  Number of Hours: 6.25     Treatment Plan Summary: Daily contact with patient to assess and evaluate symptoms and progress in treatment, Medication management and Plan Continue cognitive therapy reality-based therapy add Prolixin for delusions continue to monitor for safety  Jaiyon Wander, MD 10/30/2018, 9:56 AM

## 2018-10-30 NOTE — Progress Notes (Signed)
Recreation Therapy Notes     Date:10/30/2018 Time:10:00 am Location:500 hall   Group Topic:Labeling Triggers  Goal Area(s) Addresses: Patient will work on worksheet on Labeling Triggers . Patient will follow directions on first prompt.  Behavioral Response:Appropriate  Intervention:Worksheet  Activity: Staff on 500 hall were provided with a worksheet on Labeling Triggers. Staff was instructed to give it to the patients and have them work on it in place of [place]. Staff was also given 2 coloring sheets and 2 word searches and were given the option to give them out.  Education:Ability to follow Directions, Change of thought processes Discharge Planning.   Education Outcome:Acknowledges education/In group clarification offered  Clinical Observations/Feedback:. Due to COVID-19,guidelines group was not held.Group members were provided a learning activity worksheet to work on the topic and above-stated goals. LRT is available to answer any questions patient may have regarding the worksheet.  Tomi Likens, LRT/CTRS      Ryosuke Ericksen L Deborh Pense 10/30/2018 10:03 AM

## 2018-10-31 MED ORDER — ARIPIPRAZOLE ER 400 MG IM SRER: 400.0000 mg | INTRAMUSCULAR | 0 refills | Status: DC

## 2018-10-31 MED ORDER — FLUPHENAZINE HCL 10 MG PO TABS: 10.0000 mg | ORAL_TABLET | Freq: Three times a day (TID) | ORAL | 2 refills | Status: DC

## 2018-10-31 MED ORDER — FLUPHENAZINE HCL 10 MG PO TABS
10.0000 mg | ORAL_TABLET | Freq: Three times a day (TID) | ORAL | Status: DC
  Filled 2018-10-31 (×3): qty 1

## 2018-10-31 MED ORDER — FLUPHENAZINE HCL 5 MG PO TABS
10.0000 mg | ORAL_TABLET | Freq: Three times a day (TID) | ORAL | Status: DC
  Filled 2018-10-31 (×2): qty 2

## 2018-10-31 MED ORDER — ARIPIPRAZOLE ER 400 MG IM SRER: 400.0000 mg | INTRAMUSCULAR | 11 refills | Status: DC

## 2018-10-31 MED ORDER — AMANTADINE HCL 100 MG PO CAPS: 100.0000 mg | ORAL_CAPSULE | Freq: Two times a day (BID) | ORAL | 0 refills | Status: DC

## 2018-10-31 MED ORDER — AMANTADINE HCL 100 MG PO CAPS: 100.0000 mg | ORAL_CAPSULE | Freq: Two times a day (BID) | ORAL | 11 refills | Status: DC

## 2018-10-31 NOTE — Plan of Care (Signed)
Patient was admitted to the adult unit on 6/8 and was given group materials daily since admission.

## 2018-10-31 NOTE — Progress Notes (Signed)
Patient ID: Amy [name], female   DOB: 07/18/1995, 23 y.o.   MRN: [id number]   CSW contacted [facility] Department for transportation of the pt back home due to pt being IVCed. [facility] Department stated that [employer] department is responsible. CSW contacted [employer] and they stated that they are not allowed to transport back home and to call the [facility] Department back. CSW contacted the [facility] Department and they stated that getting the pt back home is the responsibility of the [facility] Department but that they will do it. Sheriff stated that he does not know when it will be because they are busy. He stated that it could be as late as 5pm and as early as noon. He stated that he would call when they are on their way.

## 2018-10-31 NOTE — Progress Notes (Signed)
Recreation Therapy Notes  Date: 10/30/2018 Time: 10:00 am Location: 500 hall   Group Topic: Self Esteem, Strengths and Qualities  Goal Area(s) Addresses:  Patient will work on Radio producer on [employer], Strengths and Qualities. Patient will follow directions on first prompt.  Behavioral Response: Appropriate  Intervention: Worksheet  Activity:  Staff on [employer] were provided with a worksheet on [employer], Strengths and Qualities. Staff was instructed to give it to the patients and have them work on it in place of [place]. Staff was also given 2 coloring sheets and one word search and were given the option to give them out.  Education:  Ability to follow Directions, Change of thought processes Discharge Planning.   Education Outcome: Acknowledges education/In group clarification offered  Clinical Observations/Feedback: . Due to COVID-19, guidelines group was not held. Group members were provided a learning activity worksheet to work on the topic and above-stated goals. LRT is available to answer any questions patient may have regarding the worksheet.  Tomi Likens, LRT/CTRS         Jalene Demo L [place] 10/31/2018 12:06 PM

## 2018-10-31 NOTE — Plan of Care (Signed)
D: Patient is alert and cooperative. Denies SI, HI, AVH, and verbally contracts for safety. Patient denies physical symptoms/pain.    A: Medications administered per MD order. Support provided. Patient educated on safety on the unit and medications. Routine safety checks every 15 minutes. Patient stated understanding to tell nurse about any new physical symptoms. Patient understands to tell staff of any needs.     R: No adverse drug reactions noted. Patient verbally contracts for safety. Patient remains safe at this time and will continue to monitor.   Problem: Safety: Goal: Periods of time without injury will increase Outcome: Progressing   Patient remains safe and will continue to monitor.   [place] NOVEL CORONAVIRUS (COVID-19) DAILY CHECK-OFF SYMPTOMS - answer yes or no to each - every day NO YES  Have you had a fever in the past 24 hours?  Fever (Temp > 37.80C / 100F) X   Have you had any of these symptoms in the past 24 hours? New Cough  Sore Throat   Shortness of Breath  Difficulty Breathing  Unexplained Body Aches   X   Have you had any one of these symptoms in the past 24 hours not related to allergies?   Runny Nose  Nasal Congestion  Sneezing   X   If you have had runny nose, nasal congestion, sneezing in the past 24 hours, has it worsened?  X   EXPOSURES - check yes or no X   Have you traveled outside the state in the past 14 days?  X   Have you been in contact with someone with a confirmed diagnosis of COVID-19 or PUI in the past 14 days without wearing appropriate PPE?  X   Have you been living in the same home as a person with confirmed diagnosis of COVID-19 or a PUI (household contact)?    X   Have you been diagnosed with COVID-19?    X              What to do next: Answered NO to all: Answered YES to anything:   Proceed with unit schedule Follow the [facility] Inpatient Flowsheet.

## 2018-10-31 NOTE — Progress Notes (Signed)
Pt discharged to lobby. Pt was stable and appreciative at that time. All papers and prescriptions were given and valuables returned. Verbal understanding expressed. Denies SI/HI and A/VH. Pt given opportunity to express concerns and ask questions.  
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
## 2018-10-31 NOTE — Progress Notes (Signed)
Recreation Therapy Notes  INPATIENT RECREATION TR PLAN  Patient Details Name: Amy [name] MRN: [id number] DOB: [birth year] Today's Date: 10/31/2018  Rec Therapy Plan Is patient appropriate for Therapeutic Recreation?: Yes Treatment times per week: 3-5 times per week Estimated Length of Stay: 5-7 days  TR Treatment/Interventions: Group participation (Comment)  Discharge Criteria Pt will be discharged from therapy if:: Discharged Treatment plan/goals/alternatives discussed and agreed upon by:: Patient/family  Discharge Summary Short term goals set: see patient care plan Short term goals met: Adequate for discharge Progress toward goals comments: Groups attended Which groups?: Self-esteem, Goal setting, Stress management, Anger management(Labeling triggers, Stregnths and Qualities) Reason goals not met: n/a Therapeutic equipment acquired: none Reason patient discharged from therapy: Discharge from hospital Pt/family agrees with progress & goals achieved: Yes Date patient discharged from therapy: 10/31/18  Tomi Likens, LRT/CTRS  [place] 10/31/2018, 12:41 PM

## 2018-10-31 NOTE — BHH Suicide Risk Assessment (Signed)
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 rate 18, height 6\' 1"  (1.854 m), weight 75.6 kg, SpO2 100 %, unknown if currently breastfeeding.Body mass index is 21.99 kg/m.  General Appearance: Casual  Eye Contact::  Good  Speech:  Clear and [id number]  Volume:  Normal  Mood:  Euthymic  Affect:  Full Range  Thought Process:  Coherent and Goal Directed  Orientation:  Full (Time, Place, and Person)  Thought Content:  Logical and Delusions  Suicidal Thoughts:  No  Homicidal Thoughts:  No  Memory:  Immediate;   Fair  Judgement:  Fair  Insight:  Shallow  Psychomotor Activity:  Normal  Concentration:  Good  Recall:  [place] of Knowledge:Fair  Language: Fair  Akathisia:  Negative  Handed:  Right  AIMS (if indicated):     Assets:  Communication Skills Leisure Time Physical Health Resilience Social Support  Sleep:  Number of Hours: 6.25  Cognition: WNL  ADL's:  Intact  With regards to previously expressed delusion of pregnancy she is reassured that her pregnancy test is negative and she is acknowledging now she does not believe she is pregnant.  It is possible she is just endorsing this to facilitate discharge but at the present time she is not acting in a dangerous fashion reports no other psychotic symptoms, no thoughts of harming self or others No acute withdrawal  Mental Status Per Nursing Assessment::   On Admission:  NA  Demographic Factors:  Low socioeconomic status  Loss Factors: Decrease in vocational  status  Historical Factors: Impulsivity  Risk Reduction Factors:   Religious beliefs about death  Continued Clinical Symptoms:  Alcohol/Substance Abuse/Dependencies  Cognitive Features That Contribute To Risk:  Loss of executive function    Suicide Risk:  Minimal: No identifiable suicidal ideation.  Patients presenting with no risk factors but with morbid ruminations; may be classified as minimal risk based on the severity of the depressive symptoms  Follow-up Information    [facility], [facility] Follow up.   Contact information: [address] [place] [address] [phone]           Plan Of Care/Follow-up recommendations:  Activity:  full  Selisa Tensley, MD 10/31/2018, 9:44 AM

## 2018-10-31 NOTE — Progress Notes (Signed)
  [facility] Adult Case Management Discharge Plan :  Will you be returning to the same living situation after discharge:  Yes,  at home At discharge, do you have transportation home?: Yes,  [place] department Do you have the ability to pay for your medications: No.; ACT team  Release of information consent forms completed and in the chart;  Patient's signature needed at discharge.  Patient to Follow up at: Follow-up Information    [facility], [facility] Follow up on 11/04/2018.   Why: An ACT team member will be out Monday, June 15th after 11am. Your ACT team doctor will do a video chat with you on Tuesday, June 16th at [place] information: [address] [place] [phone]           Next level of care provider has access to [place] and Suicide Prevention discussed: No.; pt denied; SPE with pt      Has patient been referred to the Quitline?: Patient refused referral  Patient has been referred for addiction treatment: Pt. refused referral  [name], LCSW 10/31/2018, 11:04 AM

## 2018-11-09 ENCOUNTER — Emergency Department (HOSPITAL_BASED_OUTPATIENT_CLINIC_OR_DEPARTMENT_OTHER)
Admission: EM | Admit: 2018-11-09 | Discharge: 2018-11-10 | Disposition: A | Discharge status: Home / Self Care | Payer: Self-pay | Attending: Emergency Medicine | Admitting: Emergency Medicine

## 2018-11-09 ENCOUNTER — Other Ambulatory Visit: Payer: Self-pay

## 2018-11-09 ENCOUNTER — Encounter (HOSPITAL_BASED_OUTPATIENT_CLINIC_OR_DEPARTMENT_OTHER): Payer: Self-pay

## 2018-11-09 DIAGNOSIS — F419 Anxiety disorder, unspecified: Secondary | ICD-10-CM | POA: Insufficient documentation
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
## 2018-11-10 MED ORDER — LORAZEPAM 1 MG PO TABS
1.0000 mg | ORAL_TABLET | Freq: Once | ORAL | Status: AC
  Administered 2018-11-10: 1 mg via ORAL
  Filled 2018-11-10: qty 1
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
## 2018-11-10 NOTE — ED Notes (Signed)
ED Provider at bedside. 

## 2018-11-10 NOTE — ED Notes (Signed)
Patient resting calmly in bed at this time.  Encouraged to call for a ride so we can medicate her for her anxiety.  Reports she will try.

## 2018-11-13 IMAGING — CR DG KNEE COMPLETE 4+V*L*
4 series · 4 of 4 positions shown · non-contrast
Comparison: None.

CLINICAL DATA: Pain following twisting injury while running

EXAM:
LEFT KNEE - COMPLETE 4+ VIEW

[t knee ap left]
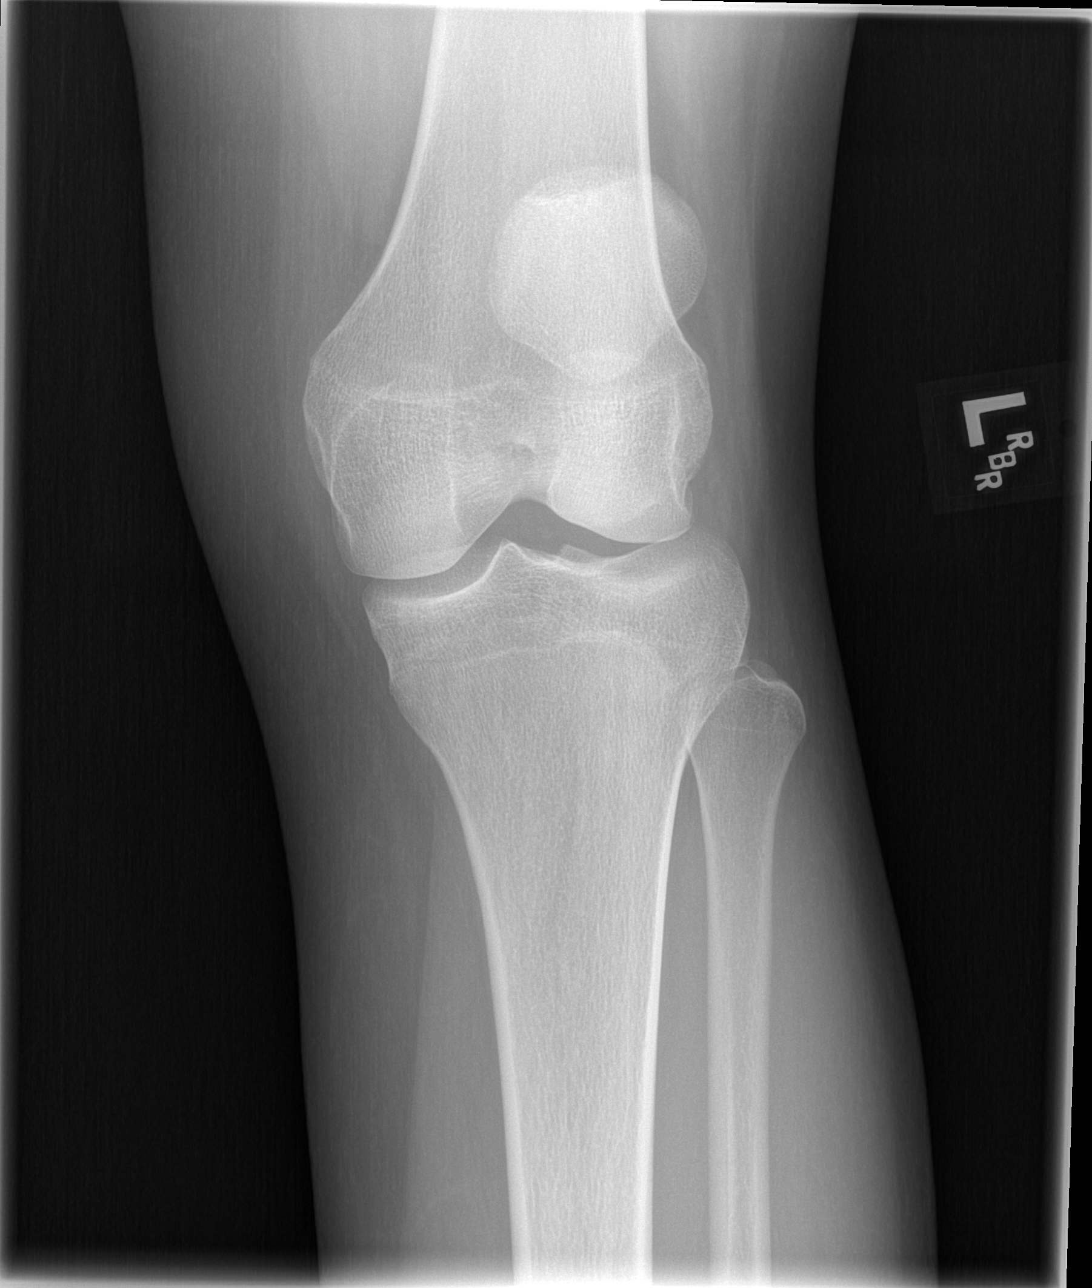

[t knee oblique left (1 of 2)]
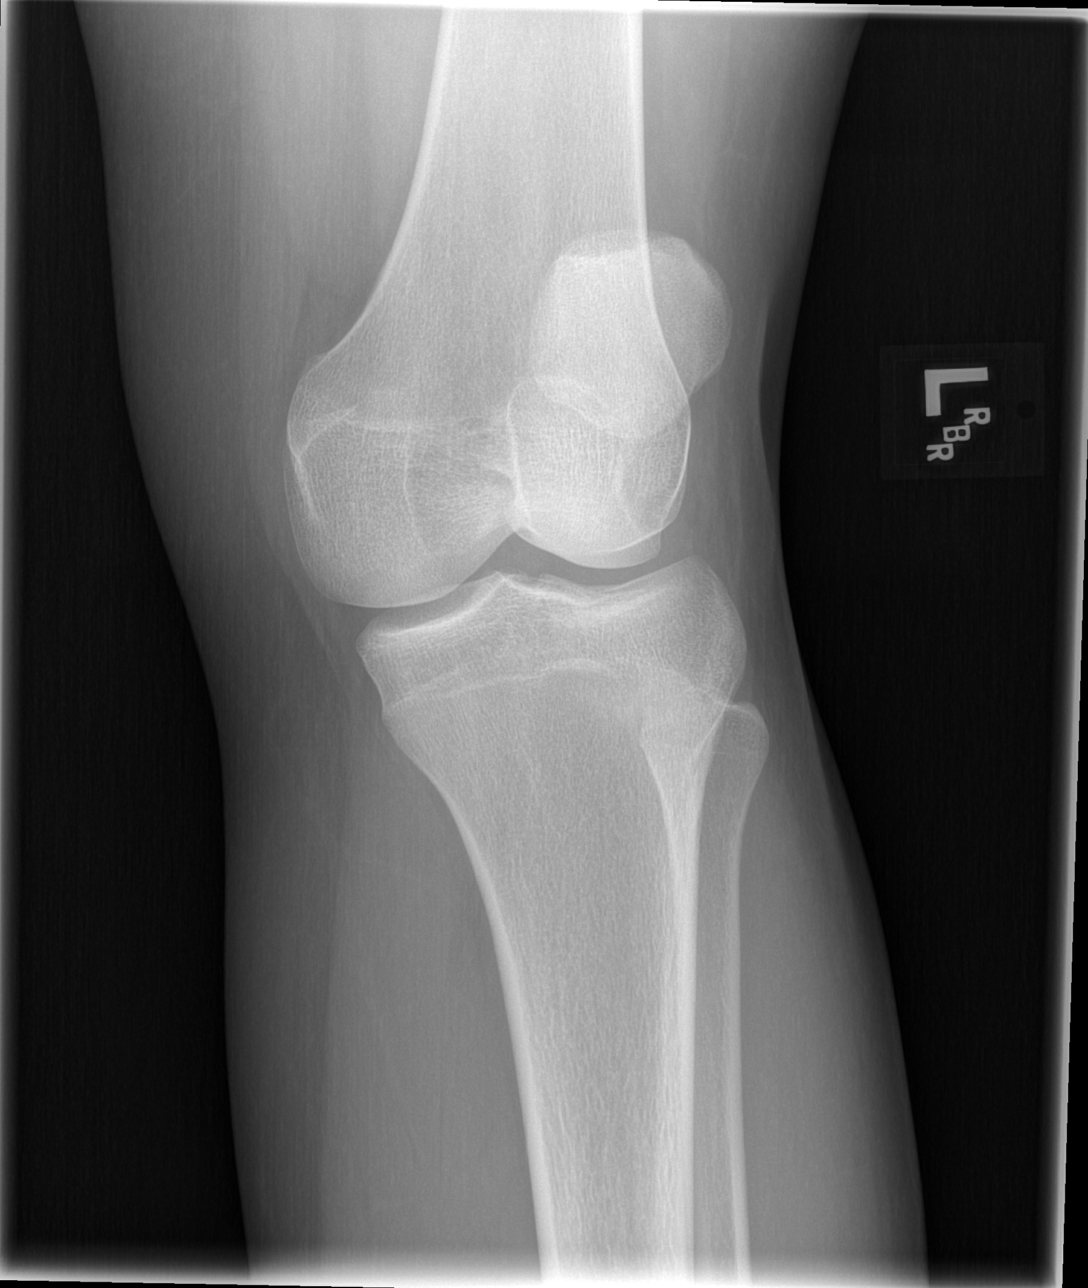

[t knee oblique left (2 of 2)]
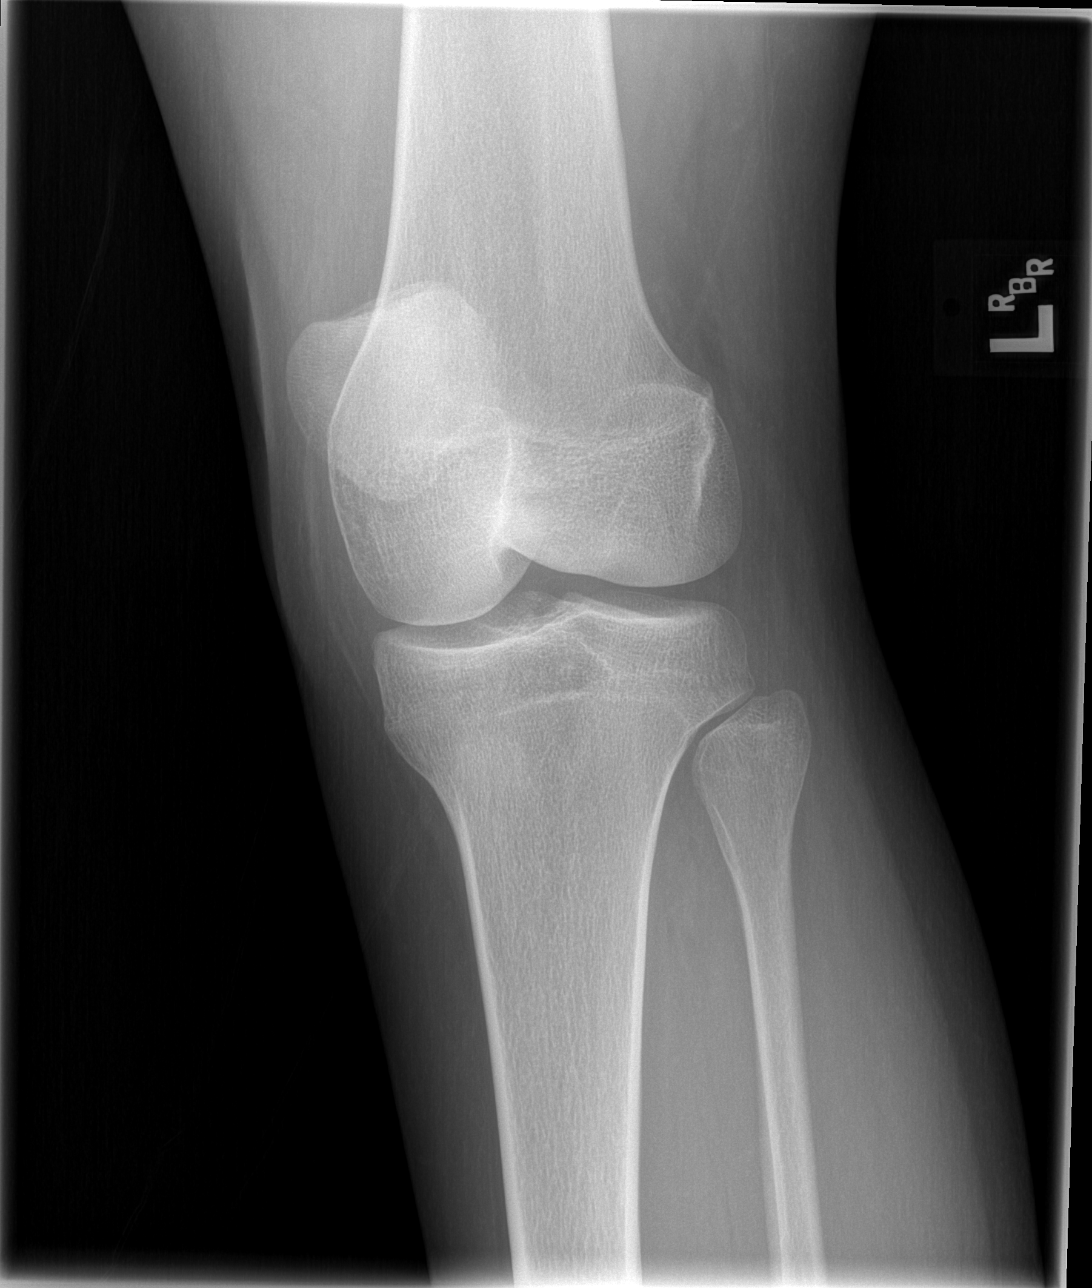

[t knee lat left]
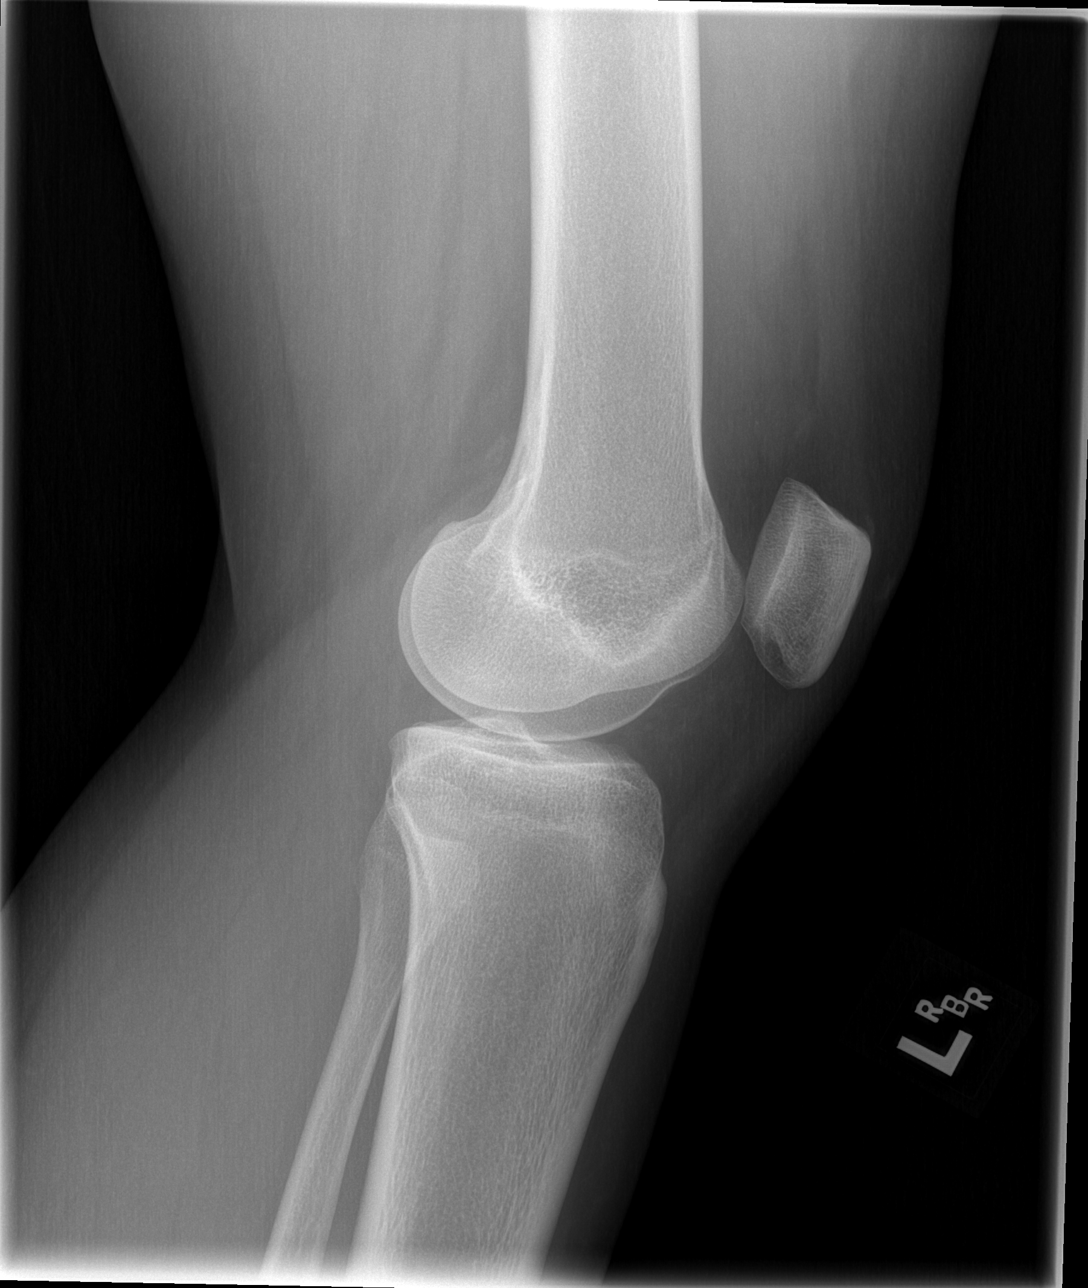

[4 of 4 positions shown; findings below may reference images not displayed]

FINDINGS: Frontal, lateral, and bilateral oblique views were obtained. No
fracture or dislocation. No joint effusion. Joint spaces appear
normal. No erosive change.
IMPRESSION: No fracture or dislocation. No joint effusion. No appreciable
arthropathy.

## 2019-01-12 ENCOUNTER — Emergency Department (HOSPITAL_COMMUNITY)
Admission: EM | Admit: 2019-01-12 | Discharge: 2019-01-12 | Disposition: A | Discharge status: Home / Self Care | Payer: Self-pay | Attending: Emergency Medicine | Admitting: Emergency Medicine

## 2019-01-12 ENCOUNTER — Encounter (HOSPITAL_COMMUNITY): Payer: Self-pay

## 2019-01-12 DIAGNOSIS — F329 Major depressive disorder, single episode, unspecified: Secondary | ICD-10-CM | POA: Insufficient documentation

## 2019-01-12 DIAGNOSIS — F3010 Manic episode without psychotic symptoms, unspecified: Secondary | ICD-10-CM

## 2019-01-12 DIAGNOSIS — F309 Manic episode, unspecified: Secondary | ICD-10-CM | POA: Insufficient documentation

## 2019-01-12 DIAGNOSIS — F17210 Nicotine dependence, cigarettes, uncomplicated: Secondary | ICD-10-CM | POA: Insufficient documentation

## 2019-01-12 DIAGNOSIS — Z20828 Contact with and (suspected) exposure to other viral communicable diseases: Secondary | ICD-10-CM | POA: Insufficient documentation

## 2019-01-12 LAB — CBC WITH DIFFERENTIAL/PLATELET
Abs Immature Granulocytes: 0.04 10*3/uL (ref 0.00–0.07)
Basophils Absolute: 0 10*3/uL (ref 0.0–0.1)
Basophils Relative: 0 %
Eosinophils Absolute: 0 10*3/uL (ref 0.0–0.5)
Eosinophils Relative: 0 %
HCT: 36.1 % (ref 36.0–46.0)
Hemoglobin: 12.2 g/dL (ref 12.0–15.0)
Immature Granulocytes: 0 %
Lymphocytes Relative: 11 %
Lymphs Abs: 1.1 10*3/uL (ref 0.7–4.0)
MCH: 33.8 pg (ref 26.0–34.0)
MCHC: 33.8 g/dL (ref 30.0–36.0)
MCV: 100 fL (ref 80.0–100.0)
Monocytes Absolute: 0.7 10*3/uL (ref 0.1–1.0)
Monocytes Relative: 7 %
Neutro Abs: 8.2 10*3/uL — ABNORMAL HIGH (ref 1.7–7.7)
Neutrophils Relative %: 82 %
Platelets: 195 10*3/uL (ref 150–400)
RBC: 3.61 MIL/uL — ABNORMAL LOW (ref 3.87–5.11)
RDW: 12.6 % (ref 11.5–15.5)
WBC: 10 10*3/uL (ref 4.0–10.5)
nRBC: 0 % (ref 0.0–0.2)

## 2019-01-12 LAB — COMPREHENSIVE METABOLIC PANEL
ALT: 16 U/L (ref 0–44)
AST: 22 U/L (ref 15–41)
Albumin: 4.4 g/dL (ref 3.5–5.0)
Alkaline Phosphatase: 42 U/L (ref 38–126)
Anion gap: 7 (ref 5–15)
BUN: 14 mg/dL (ref 6–20)
CO2: 24 mmol/L (ref 22–32)
Calcium: 9.2 mg/dL (ref 8.9–10.3)
Chloride: 113 mmol/L — ABNORMAL HIGH (ref 98–111)
Creatinine, Ser: 1.09 mg/dL — ABNORMAL HIGH (ref 0.44–1.00)
GFR calc Af Amer: >60 mL/min (ref >60)
GFR calc non Af Amer: >60 mL/min (ref >60)
Glucose, Bld: 78 mg/dL (ref 70–99)
Potassium: 3.1 mmol/L — ABNORMAL LOW (ref 3.5–5.1)
Sodium: 144 mmol/L (ref 135–145)
Total Bilirubin: 0.8 mg/dL (ref 0.3–1.2)
Total Protein: 7.3 g/dL (ref 6.5–8.1)

## 2019-01-12 LAB — SARS CORONAVIRUS 2 (TAT 6-24 HRS): SARS Coronavirus 2: NEGATIVE

## 2019-01-12 LAB — I-STAT BETA HCG BLOOD, ED (MC, WL, AP ONLY): I-stat hCG, quantitative: <5 m[IU]/mL (ref <5)

## 2019-01-12 MED ORDER — LORAZEPAM 2 MG/ML IJ SOLN
2.0000 mg | Freq: Once | INTRAMUSCULAR | Status: AC
  Administered 2019-01-12: 09:00:00 2 mg via INTRAMUSCULAR
  Filled 2019-01-12: qty 1

## 2019-01-12 MED ORDER — ZIPRASIDONE MESYLATE 20 MG IM SOLR
INTRAMUSCULAR | Status: AC
  Administered 2019-01-12: 20 mg
  Filled 2019-01-12: qty 20

## 2019-01-12 MED ORDER — STERILE WATER FOR INJECTION IJ SOLN
INTRAMUSCULAR | Status: AC
  Filled 2019-01-12: qty 10

## 2019-01-12 MED ORDER — ZIPRASIDONE MESYLATE 20 MG IM SOLR: 20.0000 mg | Freq: Once | INTRAMUSCULAR | Status: DC

## 2019-01-12 NOTE — ED Notes (Signed)
Pt IVCd 

## 2019-01-12 NOTE — ED Notes (Signed)
She is now awake and quite lucid. We get her food and drink per her request.

## 2019-01-12 NOTE — ED Notes (Signed)
Dr. Wilson Singer examines her and we are prepping her for d/c.

## 2019-01-12 NOTE — ED Provider Notes (Signed)
[facility]-EMERGENCY DEPT Provider Note   CSN: [id number] Arrival date & time: 01/12/19  0806     History   Chief Complaint Chief Complaint  Patient presents with   Manic Behavior    HPI Amy [name] is a 23 y.o. female.     HPI   23 year old female brought in by police.  Patient was found at a local motel acting very bizarrely.  Patient yelling incoherently at times and sometimes rambling about various topics.  No reported trauma.  Apparently has a history of psychiatric illness and substance abuse.  Unsure of recent ingestion or not.  Please report that patient was highly agitated but mostly cooperative with their requests.  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   OB History    Gravida  1   Para      Term      Preterm      AB      Living        SAB      TAB      Ectopic      Multiple      Live Births               Home  Medications    Prior to Admission medications   Medication Sig Start Date End Date Taking? Authorizing Provider  amantadine (SYMMETREL) 100 MG capsule Take 1 capsule (100 mg total) by mouth 2 (two) times daily for 30 days. Patient not taking: Reported on 01/12/2019 10/31/18 11/30/18  Aldean BakerSykes, Janet E, NP  ARIPiprazole ER (ABILIFY MAINTENA) 400 MG SRER injection Inject 2 mLs (400 mg total) into the muscle every 28 (twenty-eight) days. Due 7/9 Patient not taking: Reported on 01/12/2019 11/26/18   Aldean BakerSykes, Janet E, NP  fluPHENAZine (PROLIXIN) 10 MG tablet Take 1 tablet (10 mg total) by mouth 3 (three) times daily. Patient not taking: Reported on 01/12/2019 10/31/18   Malvin JohnsFarah, Brian, MD    Family History Family History  Problem Relation Age of Onset   Mental illness Neg Hx     Social History Social History   Tobacco Use   Smoking status: Current Some Day Smoker    Packs/day: 0.50    Types: Cigarettes   Smokeless tobacco: Never Used  Substance Use Topics   Alcohol use: Not Currently   Drug use: Not Currently    Frequency: 7.0 times per week    Types: Marijuana    Comment: last use May 2020  Allergies   Penicillins, Cogentin [benztropine], Divalproex sodium, Risperidone, and Valproic acid   Review of Systems Review of Systems  Level 5 caveat because patient is acutely psychotic. Physical Exam Updated Vital Signs BP (!) 99/52 (BP Location: Left Arm)    Pulse 90    Temp 98.5 F (36.9 C) (Oral)    Resp 18    SpO2 97%   Physical Exam Vitals signs and nursing note reviewed.  Constitutional:      Appearance: She is well-developed.     Comments: Patient is extremely agitated.  Flight of ideas.  Yelling loudly and inappropriately.  Very hard to redirect.  Continually try to get up and leave the room.  HENT:     Head: Normocephalic and atraumatic.  Eyes:     General:        Right eye: No discharge.        Left eye: No discharge.     Conjunctiva/sclera: Conjunctivae normal.    Neck:     Musculoskeletal: Neck supple.  Cardiovascular:     Rate and Rhythm: Normal rate and regular rhythm.     Heart sounds: Normal heart sounds. No murmur. No friction rub. No gallop.   Pulmonary:     Effort: Pulmonary effort is normal. No respiratory distress.     Breath sounds: Normal breath sounds.  Abdominal:     General: There is no distension.     Palpations: Abdomen is soft.     Tenderness: There is no abdominal tenderness.  Musculoskeletal:        General: No tenderness.  Skin:    General: Skin is warm and dry.  Neurological:     Mental Status: She is alert.      ED Treatments / Results  Labs (all labs ordered are listed, but only abnormal results are displayed) Labs Reviewed  CBC WITH DIFFERENTIAL/PLATELET - Abnormal; Notable for the following components:      Result Value   RBC 3.61 (*)    Neutro Abs 8.2 (*)    All other components within normal limits  SARS CORONAVIRUS 2  COMPREHENSIVE METABOLIC PANEL  ETHANOL  RAPID URINE DRUG SCREEN, HOSP PERFORMED  RAPID URINE DRUG SCREEN, HOSP PERFORMED  I-STAT BETA HCG BLOOD, ED ([facility], [facility], [facility] ONLY)  I-STAT BETA HCG BLOOD, ED ([facility], [facility], [facility] ONLY)    EKG None  Radiology No results found.  Procedures Procedures (including critical care time)  CRITICAL CARE Performed by: Virgel Manifold Total critical care time: 35 minutes Critical care time was exclusive of separately billable procedures and treating other patients. Critical care was necessary to treat or prevent imminent or life-threatening deterioration. Critical care was time spent personally by me on the following activities: development of treatment plan with patient and/or surrogate as well as nursing, discussions with consultants, evaluation of patient's response to treatment, examination of patient, obtaining history from patient or surrogate, ordering and performing treatments and interventions, ordering and review of laboratory studies, ordering and review  of radiographic studies, pulse oximetry and re-evaluation of patient's condition.   Medications Ordered in ED Medications  sterile water (preservative free) injection (has no administration in time range)  LORazepam (ATIVAN) injection 2 mg (2 mg Intramuscular Given 01/12/19 0831)  ziprasidone (GEODON) 20 MG injection (20 mg  Given 01/12/19 5809)     Initial Impression / Assessment and Plan / ED Course  I have reviewed the triage vital signs and the nursing notes.  Pertinent labs & imaging results that were available  during my care of the patient were reviewed by me and considered in my medical decision making (see chart for details).        Patient with combative/manic behavior on arrival.  She had to be chemically sedated.  After multiple hours of sleep in the emergency room she is now awake and alert.  She states that "I was just dehydrated."  She claims that she cannot remember what happened earlier.  I highly doubt this.  Very strong suspicion that her behavior was substance-induced on top of her known underlying psychiatric illness.  She is now alert and oriented.  Denies thoughts of wanting to harm herself or anyone else.  She does not appear to be responding to internal stimuli.  At this point I think she is both medically and psychiatrically fine to go home.  Encouraged to get help for psychiatric/substance abuse.  Final Clinical Impressions(s) / ED Diagnoses   Final diagnoses:  Manic behavior [facility](HCC)    ED Discharge Orders    None       Raeford RazorKohut, Kush Farabee, MD 01/17/19 1110

## 2019-01-12 NOTE — Discharge Instructions (Addendum)
Stop doing drugs. Nothing good is going to come of it. Take your medications.

## 2019-01-12 NOTE — ED Notes (Signed)
Pt states her aunt is providing transportation for her.

## 2019-01-12 NOTE — ED Triage Notes (Signed)
She was observed to be acting very bizarrely at a local motel. Observers phoned police, and they brought her here for treatment. She is very verbose with a constant stream of flight-of-ideas. Security and police are with her as I write this. We are preparing to medicate her according to Dr. Eugenio Hoes instruction.

## 2019-01-12 NOTE — ED Notes (Signed)
She is sleeping soundly. She is breathing normally. Monitor shows nsr without ectopy.

## 2019-01-13 ENCOUNTER — Emergency Department (HOSPITAL_COMMUNITY)
Admission: EM | Admit: 2019-01-13 | Discharge: 2019-01-14 | Disposition: A | Discharge status: Other Acute Inpatient Hospital | Payer: Self-pay | Attending: Emergency Medicine | Admitting: Emergency Medicine

## 2019-01-13 ENCOUNTER — Other Ambulatory Visit: Payer: Self-pay

## 2019-01-13 ENCOUNTER — Encounter (HOSPITAL_COMMUNITY): Payer: Self-pay

## 2019-01-13 DIAGNOSIS — F17210 Nicotine dependence, cigarettes, uncomplicated: Secondary | ICD-10-CM | POA: Insufficient documentation

## 2019-01-13 DIAGNOSIS — F23 Brief psychotic disorder: Secondary | ICD-10-CM | POA: Insufficient documentation

## 2019-01-13 DIAGNOSIS — R441 Visual hallucinations: Secondary | ICD-10-CM | POA: Insufficient documentation

## 2019-01-13 DIAGNOSIS — F1910 Other psychoactive substance abuse, uncomplicated: Secondary | ICD-10-CM | POA: Insufficient documentation

## 2019-01-13 DIAGNOSIS — Z20828 Contact with and (suspected) exposure to other viral communicable diseases: Secondary | ICD-10-CM | POA: Insufficient documentation

## 2019-01-13 DIAGNOSIS — F250 Schizoaffective disorder, bipolar type: Secondary | ICD-10-CM | POA: Insufficient documentation

## 2019-01-13 LAB — COMPREHENSIVE METABOLIC PANEL
ALT: 16 U/L (ref 0–44)
AST: 24 U/L (ref 15–41)
Albumin: 3.6 g/dL (ref 3.5–5.0)
Alkaline Phosphatase: 36 U/L — ABNORMAL LOW (ref 38–126)
Anion gap: 10 (ref 5–15)
BUN: 12 mg/dL (ref 6–20)
CO2: 22 mmol/L (ref 22–32)
Calcium: 8.7 mg/dL — ABNORMAL LOW (ref 8.9–10.3)
Chloride: 110 mmol/L (ref 98–111)
Creatinine, Ser: 1.09 mg/dL — ABNORMAL HIGH (ref 0.44–1.00)
GFR calc Af Amer: >60 mL/min (ref >60)
GFR calc non Af Amer: >60 mL/min (ref >60)
Glucose, Bld: 81 mg/dL (ref 70–99)
Potassium: 3.3 mmol/L — ABNORMAL LOW (ref 3.5–5.1)
Sodium: 142 mmol/L (ref 135–145)
Total Bilirubin: 1.4 mg/dL — ABNORMAL HIGH (ref 0.3–1.2)
Total Protein: 5.9 g/dL — ABNORMAL LOW (ref 6.5–8.1)

## 2019-01-13 LAB — CBC WITH DIFFERENTIAL/PLATELET
Abs Immature Granulocytes: 0.03 10*3/uL (ref 0.00–0.07)
Basophils Absolute: 0 10*3/uL (ref 0.0–0.1)
Basophils Relative: 0 %
Eosinophils Absolute: 0 10*3/uL (ref 0.0–0.5)
Eosinophils Relative: 0 %
HCT: 36.3 % (ref 36.0–46.0)
Hemoglobin: 11.6 g/dL — ABNORMAL LOW (ref 12.0–15.0)
Immature Granulocytes: 0 %
Lymphocytes Relative: 25 %
Lymphs Abs: 2.8 10*3/uL (ref 0.7–4.0)
MCH: 33.4 pg (ref 26.0–34.0)
MCHC: 32 g/dL (ref 30.0–36.0)
MCV: 104.6 fL — ABNORMAL HIGH (ref 80.0–100.0)
Monocytes Absolute: 0.9 10*3/uL (ref 0.1–1.0)
Monocytes Relative: 8 %
Neutro Abs: 7.4 10*3/uL (ref 1.7–7.7)
Neutrophils Relative %: 67 %
Platelets: 184 10*3/uL (ref 150–400)
RBC: 3.47 MIL/uL — ABNORMAL LOW (ref 3.87–5.11)
RDW: 12.6 % (ref 11.5–15.5)
WBC: 11.1 10*3/uL — ABNORMAL HIGH (ref 4.0–10.5)
nRBC: 0 % (ref 0.0–0.2)

## 2019-01-13 LAB — ACETAMINOPHEN LEVEL: Acetaminophen (Tylenol), Serum: <10 ug/mL — ABNORMAL LOW (ref 10–30)

## 2019-01-13 LAB — SALICYLATE LEVEL: Salicylate Lvl: <7 mg/dL (ref 2.8–30.0)

## 2019-01-13 LAB — I-STAT BETA HCG BLOOD, ED (MC, WL, AP ONLY): I-stat hCG, quantitative: <5 m[IU]/mL (ref <5)

## 2019-01-13 LAB — ETHANOL: Alcohol, Ethyl (B): <10 mg/dL (ref <10)

## 2019-01-13 MED ORDER — LORAZEPAM 2 MG/ML IJ SOLN
2.0000 mg | Freq: Once | INTRAMUSCULAR | Status: DC
  Filled 2019-01-13: qty 1

## 2019-01-13 MED ORDER — STERILE WATER FOR INJECTION IJ SOLN
INTRAMUSCULAR | Status: AC
  Administered 2019-01-13: 15:00:00
  Filled 2019-01-13: qty 10

## 2019-01-13 MED ORDER — IBUPROFEN 400 MG PO TABS: 600.0000 mg | ORAL_TABLET | Freq: Three times a day (TID) | ORAL | Status: DC | PRN

## 2019-01-13 MED ORDER — LORAZEPAM 2 MG/ML IJ SOLN
2.0000 mg | Freq: Once | INTRAMUSCULAR | Status: AC
  Administered 2019-01-13: 2 mg via INTRAMUSCULAR

## 2019-01-13 MED ORDER — STERILE WATER FOR INJECTION IJ SOLN
INTRAMUSCULAR | Status: AC
  Filled 2019-01-13: qty 10

## 2019-01-13 MED ORDER — ONDANSETRON HCL 4 MG PO TABS: 4.0000 mg | ORAL_TABLET | Freq: Three times a day (TID) | ORAL | Status: DC | PRN

## 2019-01-13 MED ORDER — ZIPRASIDONE MESYLATE 20 MG IM SOLR
20.0000 mg | Freq: Once | INTRAMUSCULAR | Status: AC
  Administered 2019-01-13: 02:00:00 20 mg via INTRAMUSCULAR
  Filled 2019-01-13: qty 20

## 2019-01-13 MED ORDER — DIPHENHYDRAMINE HCL 50 MG/ML IJ SOLN
50.0000 mg | Freq: Once | INTRAMUSCULAR | Status: AC
  Administered 2019-01-13: 02:00:00 50 mg via INTRAMUSCULAR
  Filled 2019-01-13: qty 1

## 2019-01-13 MED ORDER — ZIPRASIDONE MESYLATE 20 MG IM SOLR
20.0000 mg | Freq: Once | INTRAMUSCULAR | Status: AC
  Administered 2019-01-13: 15:00:00 20 mg via INTRAMUSCULAR
  Filled 2019-01-13: qty 20

## 2019-01-13 MED ORDER — ALUM & MAG HYDROXIDE-SIMETH 200-200-20 MG/5ML PO SUSP: 30.0000 mL | Freq: Four times a day (QID) | ORAL | Status: DC | PRN

## 2019-01-13 NOTE — [facility] Counselor (Signed)
Per patient's RN, patient is medicated and not alert to participate in assessment. TTS will contact [phone] at a later time to initiate tele-psych.

## 2019-01-13 NOTE — ED Triage Notes (Signed)
Per [facility], [employer] requested transport of a female pt wandering in someone's yard. She was noted to be rambling. Pt known to have a psych hx. She denied complaints of injury or illness. At times, the pt was calm and cooperative and at other times she required verbal stimuli to calm her down. Pt did not require any medications or sedatives.

## 2019-01-13 NOTE — ED Triage Notes (Signed)
PT continues to make TC with no answers. Pt refuse to get off Phone . Security stand by for Pt agitation. Pt already reported to this writer she tried to [employer] the last nurse and  Was given Geodon.
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
## 2019-01-13 NOTE — ED Notes (Signed)
Patient belongings inventoried and in locker #7

## 2019-01-13 NOTE — ED Notes (Signed)
Anna(Lunch Tray Ordered) called @ 1106.  

## 2019-01-13 NOTE — ED Provider Notes (Signed)
[facility]   CSN: [id number] Arrival date & time: 01/13/19  0209     History   Chief Complaint Chief Complaint  Patient presents with  . Hallucinations  . Psychiatric Evaluation    HPI Amy [name] is a 23 y.o. female.     Presents under EMS custody for unclear reasons.  They said that the police department called them and told them to transport her to the ER.  Not able to obtain history from the patient as she is acutely psychotic.  Level 5 caveat.     Past Medical History:  Diagnosis Date  . Asthma   . Cannabis abuse   . Depression   . 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 Schizo affective schizophrenia [facility](HCC)     Patient Active Problem List   Diagnosis Date Noted  . Schizophrenia (HCC) 10/28/2018  . Schizoaffective psychosis (HCC) 05/18/2018  . Cannabis abuse 05/04/2018  . 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 Substance induced mood disorder (HCC) 11/14/2017  . 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 Substance abuse (HCC)   . Schizoaffective disorder, bipolar type (HCC) 04/22/2017  . Cocaine abuse with cocaine-induced mood disorder (HCC) 12/16/2016  . Bipolar disorder, curr episode mixed, severe, with psychotic features (HCC) 10/25/2016  . Cocaine use disorder, mild, abuse (HCC) 10/25/2016  . Cannabis use disorder, severe, dependence (HCC) 10/25/2016  . Insomnia   . Anxiety state   . Cocaine abuse (HCC) 07/04/2015  . Cannabis abuse, continuous 07/02/2015  . Non compliance with medical treatment 06/18/2015    History reviewed. No pertinent surgical history.   OB History    Gravida  1   Para      Term      Preterm      AB      Living        SAB      TAB      Ectopic      Multiple      Live Births               Home Medications    Prior to Admission medications   Medication Sig Start Date End Date Taking? Authorizing Provider  amantadine  (SYMMETREL) 100 MG capsule Take 1 capsule (100 mg total) by mouth 2 (two) times daily for 30 days. Patient not taking: Reported on 01/12/2019 10/31/18 11/30/18  Aldean BakerSykes, Janet E, NP  ARIPiprazole ER (ABILIFY MAINTENA) 400 MG SRER injection Inject 2 mLs (400 mg total) into the muscle every 28 (twenty-eight) days. Due 7/9 Patient not taking: Reported on 01/12/2019 11/26/18   Aldean BakerSykes, Janet E, NP  fluPHENAZine (PROLIXIN) 10 MG tablet Take 1 tablet (10 mg total) by mouth 3 (three) times daily. Patient not taking: Reported on 01/12/2019 10/31/18   Malvin JohnsFarah, Brian, MD    Family History Family History  Problem Relation Age of Onset  . Mental illness Neg Hx     Social History Social History   Tobacco Use  . Smoking status: Current Some Day Smoker    Packs/day: 0.50    Types: Cigarettes  . Smokeless tobacco: Never Used  Substance Use Topics  . Alcohol use: Not Currently  . Drug use: Not Currently    Frequency: 7.0 times per week    Types: Marijuana    Comment: last use May 2020     Allergies   Penicillins, Cogentin [benztropine], Divalproex sodium, Risperidone, and Valproic acid   Review of  Systems Review of Systems  Unable to perform ROS: Psychiatric disorder     Physical Exam Updated Vital Signs There were no vitals taken for this visit.  Physical Exam Vitals signs reviewed.  Constitutional:      General: She is in acute distress.  HENT:     Nose: No congestion or rhinorrhea.     Mouth/Throat:     Mouth: Mucous membranes are dry.     Pharynx: Oropharynx is clear.  Eyes:     Conjunctiva/sclera: Conjunctivae normal.  Cardiovascular:     Rate and Rhythm: Tachycardia present.  Pulmonary:     Effort: Pulmonary effort is normal.  Abdominal:     General: There is no distension.     Tenderness: There is no abdominal tenderness.  Psychiatric:        Attention and Perception: She perceives visual hallucinations.        Mood and Affect: Mood is anxious. Affect is labile and angry.         Speech: She is noncommunicative. Speech is rapid and pressured and tangential.        Behavior: Behavior is agitated, aggressive and hyperactive.        Thought Content: Thought content is delusional.      ED Treatments / Results  Labs (all labs ordered are listed, but only abnormal results are displayed) Labs Reviewed  COMPREHENSIVE METABOLIC PANEL  ETHANOL  RAPID URINE DRUG SCREEN, HOSP PERFORMED  CBC WITH DIFFERENTIAL/PLATELET  SALICYLATE LEVEL  ACETAMINOPHEN LEVEL  I-STAT BETA HCG BLOOD, ED ([facility], [facility], [facility] ONLY)    EKG None  Radiology No results found.  Procedures .Critical Care Performed by: Merrily Pew, MD Authorized by: Merrily Pew, MD   Critical care provider statement:    Critical care time (minutes):  45   Critical care was time spent personally by me on the following activities:  Discussions with consultants, evaluation of patient's response to treatment, examination of patient, ordering and performing treatments and interventions, ordering and review of laboratory studies, ordering and review of radiographic studies, pulse oximetry, re-evaluation of patient's condition, obtaining history from patient or surrogate and review of old charts   I assumed direction of critical care for this patient from another provider in my specialty: no     (including critical care time)  Medications Ordered in ED Medications  sterile water (preservative free) injection (has no administration in time range)  ziprasidone (GEODON) injection 20 mg (20 mg Intramuscular Given 01/13/19 0227)  diphenhydrAMINE (BENADRYL) injection 50 mg (50 mg Intramuscular Given 01/13/19 0228)  LORazepam (ATIVAN) injection 2 mg (2 mg Intramuscular Given 01/13/19 0228)     Initial Impression / Assessment and Plan / ED Course  I have reviewed the triage vital signs and the nursing notes.  Pertinent labs & imaging results that were available during my care of the patient were reviewed by me and  considered in my medical decision making (see chart for details).  Appears to be acutely psychotic.  Will medicate.  Involuntarily committed.  Will need CTS consultation upon awakening.  She had multiple recent visits with similar presentations and calms down after medications.  She may need long-term psychiatric care to get this under control so she stops becoming acutely psychotic.  Could also be related to drugs.  Final Clinical Impressions(s) / ED Diagnoses   Final diagnoses:  Acute psychosis [facility])    ED Discharge Orders    None       Branton Einstein, Corene Cornea, MD 01/13/19 0236

## 2019-01-13 NOTE — ED Triage Notes (Signed)
Pt sitting up in bed asking for a sandwich before lunch. Pt was explained that the sandwich was a meal . Pt  Told she could have a sandwich this time but not in the future because there are other snacks. Pt asked for chips and was told we do not have chips.Pt then asked if there were Phillip Heal crackers and Pt was told yes . Pt asked if she wanted any thing and her response was no. Pt noe lying in her bed.

## 2019-01-13 NOTE — ED Triage Notes (Signed)
Pt out of room as soon as Geodon was given by [employer] demanding napkins .

## 2019-01-13 NOTE — ED Notes (Signed)
Pt aware that she needs to provide a urine sample 

## 2019-01-13 NOTE — ED Notes (Signed)
Pt belongings have been removed, pt placed in burgundy scrubs, wanded by security. Pt resting quietly

## 2019-01-13 NOTE — ED Triage Notes (Signed)
TC to EDP Bero to report Pt behavior.Amy [name]

## 2019-01-13 NOTE — Progress Notes (Signed)
Patient has been accepted to [facility] for inpatient psychiatric admission. Patient may arrive after 5pm. Patient is under IVC and will require sheriff transportation.  Bed: 507-1  Attending physician: Dr.Farah  RN Call for Report: [phone]  Stephanie Acre, [place] Social Worker

## 2019-01-13 NOTE — ED Notes (Addendum)
Pt denies SI/HI. CAO to self, time, and event.

## 2019-01-13 NOTE — ED Notes (Signed)
Upon arrival, pt was uncooperative and verbally abusive. Pt shouting and screaming at staff. Security and [employer] at bedside.
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
## 2019-01-13 NOTE — ED Notes (Signed)
Pt permitted this RN to give her IM injection with security on stand-by to assist.

## 2019-01-14 ENCOUNTER — Inpatient Hospital Stay (HOSPITAL_COMMUNITY)
Admission: AD | Admit: 2019-01-14 | Discharge: 2019-01-17 | DRG: 885 | Disposition: A | Discharge status: Home / Self Care | Payer: Federal, State, Local not specified - Other | Attending: Psychiatry | Admitting: Psychiatry

## 2019-01-14 ENCOUNTER — Encounter (HOSPITAL_COMMUNITY): Payer: Self-pay

## 2019-01-14 DIAGNOSIS — Z9119 Patient's noncompliance with other medical treatment and regimen: Secondary | ICD-10-CM

## 2019-01-14 DIAGNOSIS — F17210 Nicotine dependence, cigarettes, uncomplicated: Secondary | ICD-10-CM | POA: Diagnosis present

## 2019-01-14 DIAGNOSIS — F250 Schizoaffective disorder, bipolar type: Principal | ICD-10-CM | POA: Diagnosis present

## 2019-01-14 DIAGNOSIS — G4700 Insomnia, unspecified: Secondary | ICD-10-CM | POA: Diagnosis present

## 2019-01-14 DIAGNOSIS — Z590 Homelessness: Secondary | ICD-10-CM | POA: Diagnosis not present

## 2019-01-14 DIAGNOSIS — F209 Schizophrenia, unspecified: Secondary | ICD-10-CM | POA: Diagnosis present

## 2019-01-14 DIAGNOSIS — F312 Bipolar disorder, current episode manic severe with psychotic features: Secondary | ICD-10-CM | POA: Diagnosis not present

## 2019-01-14 DIAGNOSIS — F15159 Other stimulant abuse with stimulant-induced psychotic disorder, unspecified: Secondary | ICD-10-CM | POA: Diagnosis present

## 2019-01-14 DIAGNOSIS — Z880 Allergy status to penicillin: Secondary | ICD-10-CM

## 2019-01-14 LAB — RAPID URINE DRUG SCREEN, HOSP PERFORMED
Amphetamines: POSITIVE — AB
Barbiturates: NOT DETECTED
Benzodiazepines: POSITIVE — AB
Cocaine: NOT DETECTED
Opiates: NOT DETECTED
Tetrahydrocannabinol: NOT DETECTED

## 2019-01-14 LAB — NOVEL CORONAVIRUS, NAA (HOSP ORDER, SEND-OUT TO REF LAB; TAT 18-24 HRS): SARS-CoV-2, NAA: NOT DETECTED

## 2019-01-14 MED ORDER — FLUPHENAZINE HCL 2.5 MG PO TABS
5.0000 mg | ORAL_TABLET | Freq: Three times a day (TID) | ORAL | Status: DC
  Administered 2019-01-14: 5 mg via ORAL
  Filled 2019-01-14 (×2): qty 1

## 2019-01-14 MED ORDER — ALUM & MAG HYDROXIDE-SIMETH 200-200-20 MG/5ML PO SUSP: 30.0000 mL | Freq: Four times a day (QID) | ORAL | Status: DC | PRN

## 2019-01-14 MED ORDER — MAGNESIUM HYDROXIDE 400 MG/5ML PO SUSP: 30.0000 mL | Freq: Every day | ORAL | Status: DC | PRN

## 2019-01-14 MED ORDER — ACETAMINOPHEN 325 MG PO TABS: 650.0000 mg | ORAL_TABLET | Freq: Four times a day (QID) | ORAL | Status: DC | PRN

## 2019-01-14 MED ORDER — AMANTADINE HCL 100 MG PO CAPS
100.0000 mg | ORAL_CAPSULE | Freq: Two times a day (BID) | ORAL | Status: DC
  Filled 2019-01-14 (×2): qty 1

## 2019-01-14 MED ORDER — AMANTADINE HCL 100 MG PO CAPS
100.0000 mg | ORAL_CAPSULE | Freq: Two times a day (BID) | ORAL | Status: DC
  Administered 2019-01-14: 100 mg via ORAL
  Filled 2019-01-14 (×2): qty 1

## 2019-01-14 MED ORDER — IBUPROFEN 600 MG PO TABS: 600.0000 mg | ORAL_TABLET | Freq: Three times a day (TID) | ORAL | Status: DC | PRN

## 2019-01-14 NOTE — ED Notes (Addendum)
Upon reviewing pts notes, found a note timed 1243 on 8/24 stating pt was accepted to [facility] with accepting md and bed # and can come after 1700. This RN was not told this in report. Was told that pt may have a bed but it depends on COVID test which results are not back yet. Called RN at [facility], and she states that she saw the note also and there are no beds available at this time and to please let the RN that takes the call know to make a note when accepted.

## 2019-01-14 NOTE — ED Notes (Addendum)
Pt awake - eating lunch. Pt ambulatory to nurses' desk after being asking to remain in room - asking for Sprite. Aware of need for urine specimen. Pt remains manic, although pt states she does not feel like she is and wants to be d/c'd. Pt states she needs to get in touch w/her probation officer to advise her location so she does not go to jail. Advised pt she may make phone call after she eats lunch, which she is eating now. When pt attempted to make phone call earlier this am, pt was pressing numbers quickly - refused assistance from [place].

## 2019-01-14 NOTE — Progress Notes (Signed)
D: "I don't want to talk to you" pt walked away from writer when trying to assess . Pt visible on the unit appearing that nothing is wrong with her. Pt came back to writer later and was pleasant, joking and more appropriate. A: Pt was offered support and encouragement.  Pt was encourage to attend groups. Q 15 minute checks were done for safety.  R: safety maintained on unit.
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
## 2019-01-14 NOTE — ED Notes (Signed)
Per Roni, [facility], pt has been accepted - [facility] 501-1 - may be transported now.
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
## 2019-01-14 NOTE — ED Notes (Addendum)
Pt lying on bed w/eyes closed. Respirations even, unlabored. Sitter has urine specimen cup for pt d/t urine specimen needed.

## 2019-01-14 NOTE — ED Notes (Addendum)
TTS order not entered per [facility]. Order received and entered. Requested psych meds to be ordered for pt as pt is noted to appear to be manic.

## 2019-01-14 NOTE — ED Notes (Signed)
Breakfast tray ordered 

## 2019-01-14 NOTE — ED Notes (Signed)
Pt in shower.  

## 2019-01-14 NOTE — ED Notes (Signed)
Amy [name], [facility], aware pt's COVID-19 test negative. Advised will call back w/Bed assignment.

## 2019-01-14 NOTE — ED Notes (Signed)
[employer] here to transport pt to [facility] belongings - 1 labeled belongings bag and 1 blue duffle bag - [employer]. Pt aware.

## 2019-01-15 DIAGNOSIS — F312 Bipolar disorder, current episode manic severe with psychotic features: Secondary | ICD-10-CM

## 2019-01-15 MED ORDER — FLUPHENAZINE HCL 5 MG PO TABS
10.0000 mg | ORAL_TABLET | Freq: Three times a day (TID) | ORAL | Status: DC
  Administered 2019-01-15 – 2019-01-17 (×8): 10 mg via ORAL
  Filled 2019-01-15: qty 2
  Filled 2019-01-15 (×2): qty 1
  Filled 2019-01-15 (×2): qty 2
  Filled 2019-01-15: qty 1
  Filled 2019-01-15: qty 2
  Filled 2019-01-15 (×4): qty 1
  Filled 2019-01-15: qty 2
  Filled 2019-01-15: qty 1
  Filled 2019-01-15: qty 2
  Filled 2019-01-15: qty 1
  Filled 2019-01-15: qty 2
  Filled 2019-01-15: qty 1

## 2019-01-15 MED ORDER — CLONAZEPAM 1 MG PO TABS
2.0000 mg | ORAL_TABLET | Freq: Once | ORAL | Status: AC
  Administered 2019-01-15: 2 mg via ORAL
  Filled 2019-01-15: qty 2

## 2019-01-15 MED ORDER — AMANTADINE HCL 100 MG PO CAPS
100.0000 mg | ORAL_CAPSULE | Freq: Two times a day (BID) | ORAL | Status: DC
  Administered 2019-01-15 – 2019-01-17 (×4): 100 mg via ORAL
  Filled 2019-01-15 (×10): qty 1

## 2019-01-15 MED ORDER — LORAZEPAM 1 MG PO TABS
2.0000 mg | ORAL_TABLET | ORAL | Status: DC | PRN
  Administered 2019-01-16 (×2): 2 mg via ORAL
  Filled 2019-01-15 (×2): qty 2

## 2019-01-15 MED ORDER — TEMAZEPAM 30 MG PO CAPS
30.0000 mg | ORAL_CAPSULE | Freq: Every day | ORAL | Status: DC
  Administered 2019-01-15: 21:00:00 30 mg via ORAL
  Filled 2019-01-15 (×2): qty 1

## 2019-01-15 MED ORDER — CARBAMAZEPINE 100 MG PO CHEW
100.0000 mg | CHEWABLE_TABLET | Freq: Three times a day (TID) | ORAL | Status: DC
  Administered 2019-01-15 – 2019-01-16 (×4): 100 mg via ORAL
  Filled 2019-01-15 (×8): qty 1

## 2019-01-15 MED ORDER — LORAZEPAM 2 MG/ML IJ SOLN: 2.0000 mg | INTRAMUSCULAR | Status: DC | PRN

## 2019-01-15 NOTE — [facility] Group Notes (Signed)
Adult Psychoeducational Group Note  Date:  01/15/2019 Time:  9:21 AM  Group Topic/Focus:  Goals Group:   The focus of this group is to help patients establish daily goals to achieve during treatment and discuss how the patient can incorporate goal setting into their daily lives to aide in recovery.  Participation Level:  Active  Participation Quality:  Appropriate  Affect:  Appropriate  Cognitive:  Alert  Insight: Appropriate  Engagement in Group:  Engaged  Modes of Intervention:  Orientation  Additional Comments:   Pt attended orientation/goals group. Pt didn't engage much. Pt states that her goal today is to just "chill and relax".  Huel Cote 01/15/2019, 9:21 AM

## 2019-01-15 NOTE — H&P (Signed)
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  Once again her drug screen is positive for methamphetamines, also positive for benzodiazepines but she has a long history of polysubstance abuse which has induced and then continually exacerbated her untreated bipolar type condition.  At times she has been diagnosed with bipolar mania with psychosis at other times schizoaffective disorder bipolar type both complicated by substance abuse at any rate the treatment is the same.  She is noncompliant with her long-acting injectable aripiprazole.  She has a chronic history of noncompliance as discussed.  Currently she was found at a local hotel acting very bizarrely and police/EMS were called.  She [facility] during that time she required frequent chemical restraint frequent redirection was at times volatile at other times simply manic without particular volatility. She made numerous delusional and bizarre statements she does not recall at this point in time.  Patient is somewhat euphoric this morning wanting to hug me and is rambling, stating she is "living with friends" and wants to leave.  In in the same breath states she "wanted to come see me" she recalls me from prior hospitalizations here and elsewhere.  At the present time she is alert and  oriented to person place time situation but not exact date her speech is pressured rambling her mood is euphoric she denies auditory or visual hallucinations and she denies thoughts of harming self or others she is so distracted and self distracting she can repeat only 1 of 3 immediately.  No withdrawal symptoms noted.  Associated Signs/Symptoms: Depression Symptoms:  insomnia, psychomotor agitation, impaired memory, (Hypo) Manic Symptoms:  Delusions, Distractibility, Elevated Mood, Anxiety Symptoms:  n/a Psychotic Symptoms:  Disorganized thought and behavior PTSD Symptoms: NA Total Time spent with patient: 45 minutes  Past Psychiatric History: Numerous admissions here and elsewhere numerous attempts at long-acting injectable chronic noncompliance chronic homelessness  Is the patient at risk to self? Yes.    Has the patient been a risk to self in the past 6 months? Yes.    Has the patient been a risk to self within the distant past? Yes.    Is the patient a risk to others? No.  Has the patient been a risk to others in the past 6 months? No.  Has the patient been a risk to others within the distant past? No.   Prior Inpatient Therapy:   Prior Outpatient Therapy:    Alcohol Screening: 1. How often do you have a drink containing alcohol?: Never 2. How many drinks containing alcohol do you have on a typical day when you are drinking?: 1 or 2 3. How often do you have six or more drinks on one occasion?: Never AUDIT-C Score: 0 4. How often during the last year have you found that you were not able to stop drinking once you had started?: Never 5. How often during the last year have you failed to do what was normally expected from you becasue  of drinking?: Never 6. How often during the last year have you needed a first drink in the morning to get yourself going after a heavy drinking session?: Never 7. How often during the last year have you had a feeling of guilt of remorse after  drinking?: Never 8. How often during the last year have you been unable to remember what happened the night before because you had been drinking?: Never 9. Have you or someone else been injured as a result of your drinking?: No 10. Has a relative or friend or a doctor or another health worker been concerned about your drinking or suggested you cut down?: No Alcohol Use Disorder Identification Test Final Score (AUDIT): 0 Substance Abuse History in the last 12 months:  Yes.   Consequences of Substance Abuse: NA Previous Psychotropic Medications: Yes  Psychological Evaluations: No  Past Medical History:  Past Medical History:  Diagnosis Date  . Asthma   . Cannabis abuse   . Depression   . 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 Schizo affective schizophrenia (HCC)    History reviewed. No pertinent surgical history. Family History:  Family History  Problem Relation Age of Onset  . Mental illness Neg Hx    Family Psychiatric  History: Negative Tobacco Screening:   Social History:  Social History   Substance and Sexual Activity  Alcohol Use Not Currently     Social History   Substance and Sexual Activity  Drug Use Yes  . Types: Marijuana    Additional Social History:                           Allergies:   Allergies  Allergen Reactions  . Penicillins Rash    Has patient had a PCN reaction causing immediate rash, facial/tongue/throat swelling, SOB or lightheadedness with hypotension: Yes Has patient had a PCN reaction causing severe rash involving mucus membranes or skin necrosis: No Has patient had a PCN reaction that required hospitalization: No Has patient had a PCN reaction occurring within the last 10 years: Yes If all of the above answers are "NO", then may   . Cogentin [Benztropine] Itching  . Divalproex Sodium Other (See Comments)    Creates feelings of paranoia, some suicidal feelings, and makes her feel that "people are coming after" her Creates feelings of  paranoia, some suicidal feelings, and makes her feel that "people are coming after" her  . Risperidone Other (See Comments)    "Makes me cough"  . Valproic Acid Other (See Comments)    [facility]   Lab Results:  Results for orders placed or performed during the hospital encounter of 01/13/19 (from the past 48 hour(s))  Urine rapid drug screen (hosp performed)     Status: Abnormal   Collection Time: 01/13/19  1:10 PM  Result Value Ref Range   Opiates NONE DETECTED NONE DETECTED   Cocaine NONE DETECTED NONE DETECTED   Benzodiazepines POSITIVE (A) NONE DETECTED   Amphetamines POSITIVE (A) NONE DETECTED   Tetrahydrocannabinol NONE DETECTED NONE DETECTED   Barbiturates NONE DETECTED NONE DETECTED    Comment: (NOTE) DRUG SCREEN FOR MEDICAL PURPOSES ONLY.  IF CONFIRMATION IS NEEDED FOR ANY PURPOSE, NOTIFY LAB WITHIN 5 DAYS. LOWEST DETECTABLE LIMITS FOR URINE DRUG SCREEN Drug Class                     Cutoff (ng/mL) Amphetamine and metabolites  1000 Barbiturate and metabolites    200 Benzodiazepine                 259 Tricyclics and metabolites     300 Opiates and metabolites        300 Cocaine and metabolites        300 THC                            50 Performed at [facility], [place] [address] [place], [place] [address]     Blood Alcohol level:  Lab Results  Component Value Date   [facility] <10 01/13/2019   ETH <10 56/43/3295    Metabolic Disorder Labs:  Lab Results  Component Value Date   HGBA1C 4.7 (L) 10/29/2018   MPG 88.19 10/29/2018   MPG 96.8 06/20/2018   Lab Results  Component Value Date   PROLACTIN 33.3 (H) 10/25/2016   PROLACTIN 66.9 (H) 12/03/2015   Lab Results  Component Value Date   CHOL 112 10/29/2018   TRIG 59 10/29/2018   HDL 45 10/29/2018   CHOLHDL 2.5 10/29/2018   VLDL 12 10/29/2018   LDLCALC 55 10/29/2018   LDLCALC 65 06/20/2018    Current Medications: Current  Facility-Administered Medications  Medication Dose Route Frequency Provider Last Rate Last Dose  . acetaminophen (TYLENOL) tablet 650 mg  650 mg Oral Q6H PRN Niel Hummer, NP      . alum & mag hydroxide-simeth (MAALOX/MYLANTA) 200-200-20 MG/5ML suspension 30 mL  30 mL Oral Q6H PRN Niel Hummer, NP      . amantadine (SYMMETREL) capsule 100 mg  100 mg Oral BID Johnn Hai, MD      . carbamazepine (TEGRETOL) chewable tablet 100 mg  100 mg Oral TID Johnn Hai, MD      . clonazePAM Bobbye [place]) tablet 2 mg  2 mg Oral Once Johnn Hai, MD      . fluPHENAZine (PROLIXIN) tablet 10 mg  10 mg Oral TID Johnn Hai, MD      . ibuprofen (ADVIL) tablet 600 mg  600 mg Oral Q8H PRN Niel Hummer, NP      . LORazepam (ATIVAN) tablet 2 mg  2 mg Oral Q4H PRN Johnn Hai, MD       Or  . LORazepam (ATIVAN) injection 2 mg  2 mg Intramuscular Q4H PRN Johnn Hai, MD      . magnesium hydroxide (MILK OF MAGNESIA) suspension 30 mL  30 mL Oral Daily PRN Elmarie Shiley A, NP      . temazepam (RESTORIL) capsule 30 mg  30 mg Oral QHS Johnn Hai, MD       PTA Medications: Medications Prior to Admission  Medication Sig Dispense Refill Last Dose  . amantadine (SYMMETREL) 100 MG capsule Take 1 capsule (100 mg total) by mouth 2 (two) times daily for 30 days. (Patient not taking: Reported on 01/12/2019) 60 capsule 0   . ARIPiprazole ER (ABILIFY MAINTENA) 400 MG SRER injection Inject 2 mLs (400 mg total) into the muscle every 28 (twenty-eight) days. Due 7/9 (Patient not taking: Reported on 01/12/2019) 1 each 0   . fluPHENAZine (PROLIXIN) 10 MG tablet Take 1 tablet (10 mg total) by mouth 3 (three) times daily. (Patient not taking: Reported on 01/12/2019) 90 tablet 2     Musculoskeletal: Strength & Muscle Tone: within normal limits Gait & Station: normal Patient leans: [name]  Psychiatric Specialty Exam: Physical Exam  Nursing note and vitals reviewed. Constitutional: She appears well-developed and well-nourished.   Cardiovascular: Normal rate and regular rhythm.    Review of Systems  Eyes: Negative.   Musculoskeletal: Negative.   Neurological: Negative.   Endo/Heme/Allergies: Negative.     Blood pressure 109/89, pulse 83, temperature 97.6 F (36.4 C), temperature source Oral, resp. rate 18, height 5\' 11"  (1.803 m), weight 52.2 kg, SpO2 99 %, unknown if currently breastfeeding.Body mass index is 16.04 kg/m.  General Appearance: Casual  Eye Contact:  Good  Speech:  Pressured  Volume:  Increased  Mood:  Euphoric  Affect:  Congruent  Thought Process:  Irrelevant and Descriptions of Associations: Loose  Orientation:  Full (Time, Place, and Person)  Thought Content:  Illogical and Delusions  Suicidal Thoughts:  No  Homicidal Thoughts:  No  Memory:  Immediate;   Poor  Judgement:  Impaired  Insight:  Lacking  Psychomotor Activity:  Increased  Concentration:  Concentration: Poor  Recall:  Poor  Fund of Knowledge:  nl  Language:  nl  Akathisia:  Negative  Handed:  Right  AIMS (if indicated):     Assets:  Leisure Time Physical Health Resilience  ADL's:  Intact  Cognition:  WNL  Sleep:  Number of Hours: 5.25    Treatment Plan Summary: Daily contact with patient to assess and evaluate symptoms and progress in treatment and Medication management  Observation Level/Precautions:  15 minute checks  Laboratory:  UDS  Psychotherapy: Cognitive and reality based rehab based when ready  Medications: Resume Prolixin clonazepam 1 time dosing for calming purposes Tegretol as mood stabilizer  Consultations: Not necessary  Discharge Concerns: Clearly needs guardianship and placement in a group home  Estimated LOS: 7-10  Other: Axis I bipolar manic with psychosis/methamphetamine due to psychosis/polysubstance abuse/Axis II borderline traits/Case complicated by chronic noncompliance/chronic substance abuse/chronic homelessness   Physician Treatment Plan for Primary Diagnosis: For mania will begin mood  stabilizer and antipsychotic therapy monitor for withdrawal Long Term Goal(s): Improvement in symptoms so as ready for discharge  Short Term Goals: Ability to demonstrate self-control will improve, Ability to identify and develop effective coping behaviors will improve, Ability to maintain clinical measurements within normal limits will improve, Compliance with prescribed medications will improve and Ability to identify triggers associated with substance abuse/mental health issues will improve  Physician Treatment Plan for Secondary Diagnosis: Active Problems:   Bipolar I disorder, current or most recent episode manic, with psychotic features (HCC)   Schizophrenia (HCC)  Long Term Goal(s): Improvement in symptoms so as ready for discharge  Short Term Goals: Ability to verbalize feelings will improve, Ability to disclose and discuss suicidal ideas, Ability to demonstrate self-control will improve and Ability to identify and develop effective coping behaviors will improve  I certify that inpatient services furnished can reasonably be expected to improve the patient's condition.    Malvin Johns, MD 8/26/20208:13 AM

## 2019-01-15 NOTE — Progress Notes (Signed)
D:Pt is tangential and pressured in her speech and says that she wants to go up [place] and get a job. Pt is silly at times interacting on the unit. Pt was receptive when treatment team discussed pt considering a group home following discharge.  A:Offered support, encouragement and 15 minute checks.  R:Pt denies si and hi. Safety maintained on the unit.

## 2019-01-15 NOTE — Progress Notes (Signed)
D: Pt denies SI/HI/AVH. Pt was agitated earlier this evening, pt was yelling and cursing , which caused the phones to be cut off for the evening. Writer tried to talk to pt, but pt would not respond and just walked away from Probation officer. Pt did come to take her medication and was tolerable to writer, but was still visibly upset about what she was talking about on the phone. Pt stated she felt she would be going to a Group home .  A: Pt was offered support and encouragement.  Pt was encourage to attend groups. Q 15 minute checks were done for safety.   R:and safety maintained on unit.

## 2019-01-15 NOTE — Tx Team (Addendum)
Interdisciplinary Treatment and Diagnostic Plan Update  01/15/2019 Time of Session: 10:05am Amy [name] MRN: [id number]  Principal Diagnosis: <principal problem not specified>  Secondary Diagnoses: Active Problems:   Bipolar I disorder, current or most recent episode manic, with psychotic features (HCC)   Schizophrenia (HCC)   Current Medications:  Current Facility-Administered Medications  Medication Dose Route Frequency Provider Last Rate Last Dose  . acetaminophen (TYLENOL) tablet 650 mg  650 mg Oral Q6H PRN Thermon Leylandavis, Laura A, NP      . alum & mag hydroxide-simeth (MAALOX/MYLANTA) 200-200-20 MG/5ML suspension 30 mL  30 mL Oral Q6H PRN Thermon Leylandavis, Laura A, NP      . amantadine (SYMMETREL) capsule 100 mg  100 mg Oral BID Malvin JohnsFarah, Brian, MD      . carbamazepine (TEGRETOL) chewable tablet 100 mg  100 mg Oral TID Malvin JohnsFarah, Brian, MD   100 mg at 01/15/19 0845  . fluPHENAZine (PROLIXIN) tablet 10 mg  10 mg Oral TID Malvin JohnsFarah, Brian, MD   10 mg at 01/15/19 0845  . ibuprofen (ADVIL) tablet 600 mg  600 mg Oral Q8H PRN Fransisca Kaufmannavis, Laura A, NP      . LORazepam (ATIVAN) tablet 2 mg  2 mg Oral Q4H PRN Malvin JohnsFarah, Brian, MD       Or  . LORazepam (ATIVAN) injection 2 mg  2 mg Intramuscular Q4H PRN Malvin JohnsFarah, Brian, MD      . magnesium hydroxide (MILK OF MAGNESIA) suspension 30 mL  30 mL Oral Daily PRN Fransisca Kaufmannavis, Laura A, NP      . temazepam (RESTORIL) capsule 30 mg  30 mg Oral QHS Malvin JohnsFarah, Brian, MD       PTA Medications: Medications Prior to Admission  Medication Sig Dispense Refill Last Dose  . amantadine (SYMMETREL) 100 MG capsule Take 1 capsule (100 mg total) by mouth 2 (two) times daily for 30 days. (Patient not taking: Reported on 01/12/2019) 60 capsule 0   . ARIPiprazole ER (ABILIFY MAINTENA) 400 MG SRER injection Inject 2 mLs (400 mg total) into the muscle every 28 (twenty-eight) days. Due 7/9 (Patient not taking: Reported on 01/12/2019) 1 each 0   . fluPHENAZine (PROLIXIN) 10 MG tablet Take 1 tablet (10 mg total) by mouth  3 (three) times daily. (Patient not taking: Reported on 01/12/2019) 90 tablet 2     Patient Stressors: Health problems Medication change or noncompliance Substance abuse  Patient Strengths: Manufacturing systems engineerCommunication skills Physical Health Special hobby/interest Supportive family/friends  Treatment Modalities: Medication Management, Group therapy, Case management,  1 to 1 session with clinician, Psychoeducation, Recreational therapy.   Physician Treatment Plan for Primary Diagnosis: <principal problem not specified> Long Term Goal(s): Improvement in symptoms so as ready for discharge Improvement in symptoms so as ready for discharge   Short Term Goals: Ability to demonstrate self-control will improve Ability to identify and develop effective coping behaviors will improve Ability to maintain clinical measurements within normal limits will improve Compliance with prescribed medications will improve Ability to identify triggers associated with substance abuse/mental health issues will improve Ability to verbalize feelings will improve Ability to disclose and discuss suicidal ideas Ability to demonstrate self-control will improve Ability to identify and develop effective coping behaviors will improve  Medication Management: Evaluate patient's response, side effects, and tolerance of medication regimen.  Therapeutic Interventions: 1 to 1 sessions, Unit Group sessions and Medication administration.  Evaluation of Outcomes: Progressing  Physician Treatment Plan for Secondary Diagnosis: Active Problems:   Bipolar I disorder, current or most recent episode manic, with  psychotic features ([place])   Schizophrenia ([place])  Long Term Goal(s): Improvement in symptoms so as ready for discharge Improvement in symptoms so as ready for discharge   Short Term Goals: Ability to demonstrate self-control will improve Ability to identify and develop effective coping behaviors will improve Ability to maintain  clinical measurements within normal limits will improve Compliance with prescribed medications will improve Ability to identify triggers associated with substance abuse/mental health issues will improve Ability to verbalize feelings will improve Ability to disclose and discuss suicidal ideas Ability to demonstrate self-control will improve Ability to identify and develop effective coping behaviors will improve     Medication Management: Evaluate patient's response, side effects, and tolerance of medication regimen.  Therapeutic Interventions: 1 to 1 sessions, Unit Group sessions and Medication administration.  Evaluation of Outcomes: Progressing   RN Treatment Plan for Primary Diagnosis: <principal problem not specified> Long Term Goal(s): Knowledge of disease and therapeutic regimen to maintain health will improve  Short Term Goals: Ability to participate in decision making will improve, Ability to verbalize feelings will improve, Ability to disclose and discuss suicidal ideas, Ability to identify and develop effective coping behaviors will improve and Compliance with prescribed medications will improve  Medication Management: RN will administer medications as ordered by provider, will assess and evaluate patient's response and provide education to patient for prescribed medication. RN will report any adverse and/or side effects to prescribing provider.  Therapeutic Interventions: 1 on 1 counseling sessions, Psychoeducation, Medication administration, Evaluate responses to treatment, Monitor vital signs and CBGs as ordered, Perform/monitor CIWA, COWS, AIMS and Fall Risk screenings as ordered, Perform wound care treatments as ordered.  Evaluation of Outcomes: Progressing   LCSW Treatment Plan for Primary Diagnosis: <principal problem not specified> Long Term Goal(s): Safe transition to appropriate next level of care at discharge, Engage patient in therapeutic group addressing  interpersonal concerns.  Short Term Goals: Engage patient in aftercare planning with referrals and resources and Increase skills for wellness and recovery  Therapeutic Interventions: Assess for all discharge needs, 1 to 1 time with Social worker, Explore available resources and support systems, Assess for adequacy in community support network, Educate family and significant other(s) on suicide prevention, Complete Psychosocial Assessment, Interpersonal group therapy.  Evaluation of Outcomes: Progressing   Progress in Treatment: Attending groups: Yes. Participating in groups: No. Taking medication as prescribed: Yes. Toleration medication: Yes. Family/Significant other contact made: No, will contact:  will contact if given consent to contact Patient understands diagnosis: No. Discussing patient identified problems/goals with staff: Yes. Medical problems stabilized or resolved: Yes. Denies suicidal/homicidal ideation: Yes. Issues/concerns per patient self-inventory: No. Other:   New problem(s) identified: No, Describe:  None  New Short Term/Long Term Goal(s): Medication stabilization, elimination of SI thoughts, and development of a comprehensive mental wellness plan.   Patient Goals:  "Get a job and move up [employer] or Barriers: CSW will continue to follow up for appropriate referrals and possible discharge planning  Reason for Continuation of Hospitalization: Delusions  Mania Medication stabilization  Estimated Length of Stay: 5-7 days   Attendees: Patient: Amy [name]  01/15/2019   Physician: Dr. Johnn Hai, MD 01/15/2019  Nursing: Jan, RN 01/15/2019   RN Care Manager: 01/15/2019  Social Worker: Ardelle Anton, LCSW 01/15/2019   Recreational Therapist:  01/15/2019   Other: Medical Student: Jonelle Sidle 01/15/2019  Other: MSW Intern: Jillyn Ledger 01/15/2019   Other: 01/15/2019      Scribe for Treatment Team: [name], LCSW 01/15/2019 11:09 AM

## 2019-01-15 NOTE — BHH Suicide Risk Assessment (Signed)
[facility] Admission Suicide Risk Assessment   Nursing information obtained from:  Patient Demographic factors:  Low socioeconomic status, Unemployed, Adolescent or young adult Current Mental Status:  NA Loss Factors:  Decrease in vocational status, Legal issues, Financial problems / change in socioeconomic status Historical Factors:  Prior suicide attempts, Impulsivity Risk Reduction Factors:  Positive coping skills or problem solving skills, Positive social support, Positive therapeutic relationship  Total Time spent with patient: 45 minutes Principal Problem: Substance abuse and mania Diagnosis:  Active Problems:   Bipolar I disorder, current or most recent episode manic, with psychotic features ([place])   Schizophrenia ([place])  Subjective Data: Readmitted for manic symptoms disorganized thought and behavior psychosis and substance abuse  Continued Clinical Symptoms:  Alcohol Use Disorder Identification Test Final Score (AUDIT): 0 The "Alcohol Use Disorders Identification Test", Guidelines for Use in Primary Care, Second Edition.  World Pharmacologist [facility]). Score between 0-7:  no or low risk or alcohol related problems. Score between 8-15:  moderate risk of alcohol related problems. Score between 16-19:  high risk of alcohol related problems. Score 20 or above:  warrants further diagnostic evaluation for alcohol dependence and treatment.   CLINICAL FACTORS:  Mania/euphoria/substance abuse  Musculoskeletal: Strength & Muscle Tone: within normal limits Gait & Station: normal Patient leans: [name]  Psychiatric Specialty Exam: Physical Exam  Nursing note and vitals reviewed. Constitutional: She appears well-developed and well-nourished.  Cardiovascular: Normal rate and regular rhythm.    Review of Systems  Eyes: Negative.   Musculoskeletal: Negative.   Neurological: Negative.   Endo/Heme/Allergies: Negative.     Blood pressure 109/89, pulse 83, temperature 97.6 F (36.4 C),  temperature source Oral, resp. rate 18, height 5\' 11"  (1.803 m), weight 52.2 kg, SpO2 99 %, unknown if currently breastfeeding.Body mass index is 16.04 kg/m.  General Appearance: Casual  Eye Contact:  Good  Speech:  Pressured  Volume:  Increased  Mood:  Euphoric  Affect:  Congruent  Thought Process:  Irrelevant and Descriptions of Associations: Loose  Orientation:  Full (Time, Place, and Person)  Thought Content:  Illogical and Delusions  Suicidal Thoughts:  No  Homicidal Thoughts:  No  Memory:  Immediate;   Poor  Judgement:  Impaired  Insight:  Lacking  Psychomotor Activity:  Increased  Concentration:  Concentration: Poor  Recall:  Poor  Fund of Knowledge:  nl  Language:  nl  Akathisia:  Negative  Handed:  Right  AIMS (if indicated):     Assets:  Leisure Time Physical Health Resilience  ADL's:  Intact  Cognition:  WNL  Sleep:  Number of Hours: 5.25     COGNITIVE FEATURES THAT CONTRIBUTE TO RISK:  Loss of executive function    SUICIDE RISK:   Mild:  Suicidal ideation of limited frequency, intensity, duration, and specificity.  There are no identifiable plans, no associated intent, mild dysphoria and related symptoms, good self-control (both objective and subjective assessment), few other risk factors, and identifiable protective factors, including available and accessible social support.  PLAN OF CARE: see eval  I certify that inpatient services furnished can reasonably be expected to improve the patient's condition.   Johnn Hai, MD 01/15/2019, 8:19 AM

## 2019-01-15 NOTE — Progress Notes (Signed)
Recreation Therapy Notes  Date: 8.26.20 Time: 1000 Location: 500 Hall Dayroom  Group Topic: Self-Esteem  Goal Area(s) Addresses:  Patient will successfully identify positive attributes about themselves.  Patient will successfully identify benefit of improved self-esteem.   Behavioral Response: Engaged  Intervention: Visual merchandiser, Building services engineer, Corporate investment banker, Immunologist  Activity: Collage.  Patients were to create a collage that represented them.  Patients could include things place they have or want to visit, things they want to accomplish, achievements they have made, etc.  Education:  Self-Esteem, Discharge Planning.   Education Outcome: Acknowledges education/In group clarification offered/Needs additional education  Clinical Observations/Feedback: Pt was active and interacted well with her peers.  Pt focused a lot on beauty with her collage.  Pt expressed she wants a son in the future.  Pt wants a big house, a futuristic car and loves food.     Victorino Sparrow, LRT/CTRS    [name], Yandell Mcjunkins A 01/15/2019 11:20 AM

## 2019-01-15 NOTE — Progress Notes (Signed)
Recreation Therapy Notes  Patient admitted to unit 8.25.20. Due to admission within last year, no new assessment conducted at this time. Last assessment conducted 12.30.19. Patient reports reason for admission is to "repair, relax and I was talking out of mind".  Pt identified her stressors as worrying about seeing her boyfriend, wondering where she will stay, getting a phone, not being able to go home and she's on probation.  Pt identified leisure interests as listening to music, seeing people happy and going for walks; which she does daily.  Pt expressed her coping skills are isolation, self injury, journal, arguments, music, exercise, meditate, deep breathing, substance abuse, prayer, dancing and hot bath/shower.    Patient denies SI, HI, AVH at this time. Patient reports goal to "relax, get mind free and to forget about me seeing my own duplicate"     Devonne Lalani [name], LRT/CTRS   Victorino Sparrow A 01/15/2019 11:51 AM

## 2019-01-15 NOTE — Progress Notes (Signed)
Pt was asked multiple times to come take her 1700 medications. After pt put the pills in her mouth and water, she immediately went to the trash can and spit them out. She said that the red pill was spit out. Pt appears to have a difficult time swallowing pills especially larger pills.

## 2019-01-16 LAB — GLUCOSE, CAPILLARY: Glucose-Capillary: 317 mg/dL — ABNORMAL HIGH (ref 70–99)

## 2019-01-16 MED ORDER — CARBAMAZEPINE 100 MG PO CHEW
200.0000 mg | CHEWABLE_TABLET | Freq: Three times a day (TID) | ORAL | Status: DC
  Administered 2019-01-16 – 2019-01-17 (×4): 200 mg via ORAL
  Filled 2019-01-16 (×11): qty 2

## 2019-01-16 NOTE — BHH Group Notes (Addendum)
LCSW Group Therapy Note  Date and Time: 01/16/2019 @ 1:30pm  Type of Therapy and Topic: Group Therapy: Feelings Around Returning Home & Establishing a Supportive Framework and Supporting Oneself When Supports Not Available  Participation Level: BHH PARTICIPATION LEVEL: Did Not Attend  Mood: Did Not Attend     Description of Group:  Patients first processed thoughts and feelings about upcoming discharge. These included fears of upcoming changes, lack of change, new living environments, judgements and expectations from others and overall stigma of mental health issues. The group then discussed the definition of a supportive framework, what that looks and feels like, and how do to discern it from an unhealthy non-supportive network. The group identified different types of supports as well as what to do when your family/friends are less than helpful or unavailable     Therapeutic Goals   1.  Patient will identify one healthy supportive network that they can use at discharge.  2.  Patient will identify one factor of a supportive framework and how to tell it from an unhealthy network.  3.  Patient able to identify one coping skill to use when they do not have positive supports from others.  4.  Patient will demonstrate ability to communicate their needs through discussion and/or role plays.     Summary of Patient Progress:     Patient did not attend group today. Announcement was made over the intercom for all patients for SW group.        Therapeutic Modalities  Cognitive Behavioral Therapy  Motivational Interviewing   Ardelle Anton, MSW, [place]

## 2019-01-16 NOTE — Progress Notes (Signed)
Recreation Therapy Notes  Date: 8.27.20 Time: 1000 Location: 500 Hall Dayroom   Group Topic: Communication, Team Building, Problem Solving  Goal Area(s) Addresses:  Patient will effectively work with peer towards shared goal.  Patient will identify skill used to make activity successful.  Patient will identify how skills used during activity can be used to reach post d/c goals.   Behavioral Response:  Engaged  Intervention: STEM Activity   Activity: [employer]. Patients were provided the following materials: 5 drinking straws, 5 rubber bands, 5 paper clips, 2 index cards and 2 drinking cups. Using the provided materials patients were asked to build a launching mechanisms to launch a ping pong ball approximately 12 feet. Patients were divided into teams of 3-5.   Education: Education officer, community, Dentist.   Education Outcome: Acknowledges education/In group clarification offered/Needs additional education.   Clinical Observations/Feedback: Pt was engaged.  Pt was social with peers.  Pt was also dancing to the music that was playing in group session.  Pt was able to keep herself under control during duration of group.     Victorino Sparrow, LRT/CTRS     Victorino Sparrow A 01/16/2019 12:09 PM
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
## 2019-01-16 NOTE — Progress Notes (Signed)
Patient denies SI, HI and AVH this shift.  Patient is noted to have an elevated mood and is intrusive but easily redirectable.   Assess patient for safety, offer medications as prescribed, engage patient in 1:1 staff talks.   Patient able to contract for safety.  Continue to monitor as prescribed.

## 2019-01-16 NOTE — Plan of Care (Signed)
Progress note  D: pt found in the hallway; compliant with medication administration. Pt denies any physical complaints or pain. Pt is still hyperactive/verbal and tangential. Pt is pleasant but intrusive. Pt is redirectable. Pt states she slept well and has rested multiple times today. Pt denies si/hi/ah/vh and verbally agrees to approach staff if these become apparent or before harming himself/others while at [place]: Pt provided support and encouragement. Pt given medication per protocol and standing orders. Q32m safety checks implemented and continued.  R: Pt safe on the unit. Will continue to monitor.  Pt progressing in the following metrics  Problem: Education: Goal: Knowledge of [place] [facility] Education information/materials will improve Outcome: Progressing Goal: Emotional status will improve Outcome: Progressing Goal: Mental status will improve Outcome: Progressing Goal: Verbalization of understanding the information provided will improve Outcome: Progressing

## 2019-01-16 NOTE — Progress Notes (Signed)
[facility]  01/16/2019 7:59 AM Amy [name]  MRN:  [id number] Subjective:   Since here the patient has been intermittently silly and intermittently agitated she is a little more contained at this morning.  Alert and oriented to person place situation time but not date.  She states she will stay at a group home, she rambles on about "I do not want to stay with my mom and my stepdad, just learned he is my stepdad" and not sure if this is true or not at any rate her vitals are stable she has no thoughts of harming self or others.  She did spit out meds x1 yesterday but is generally compliant.  Of course will need reinstitution of her long-acting injectable.  No EPS or TD.  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 Asthma   . Cannabis abuse   . Depression   . 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 Schizo affective schizophrenia (HCC)    History reviewed. No pertinent surgical history. Family History:  Family History  Problem Relation Age of Onset  . Mental illness Neg Hx    Family Psychiatric  History: neg Social History:  Social History   Substance and Sexual Activity  Alcohol Use Not Currently     Social History   Substance and Sexual Activity  Drug Use Yes  . Types: Marijuana    Social History   Socioeconomic History  . Marital status: Single    Spouse name: Not on file  . Number of children: Not on file  . Years of education: 1812  . Highest education level: 12th grade  Occupational History   . Occupation: Unemployed  Social Needs  . Financial resource strain: Not on file  . Food insecurity    Worry: Not on file    Inability: Not on file  . Transportation needs    Medical: Not on file    Non-medical: Not on file  Tobacco Use  . Smoking status: Current Some Day Smoker    Packs/day: 0.50    Types: Cigarettes  . Smokeless tobacco: Never Used  Substance and Sexual Activity  . Alcohol use: Not Currently  . Drug use: Yes    Types: Marijuana  . Sexual activity: Not Currently    Birth control/protection: None  Lifestyle  . Physical activity    Days per week: Not on file    Minutes per session: Not on file  . Stress: Not on file  Relationships  . Social Musicianconnections    Talks on phone: Not on file    Gets together: Not on file    Attends religious service: Not on file    Active member of club or organization: Not on file    Attends meetings of clubs or organizations: Not on file    Relationship status: Not on file  Other Topics Concern  . Not on file  Social History Narrative   Pt is unemployed.  [place].  Due to altered mental status, difficult to obtain history.  Per previous history, Pt  lives with her parents.   Additional Social History:                         Sleep: Good  Appetite:  Good  Current Medications: Current Facility-Administered Medications  Medication Dose Route Frequency Provider Last Rate Last Dose  . acetaminophen (TYLENOL) tablet 650 mg  650 mg Oral Q6H PRN Thermon Leyland, NP      . alum & mag hydroxide-simeth (MAALOX/MYLANTA) 200-200-20 MG/5ML suspension 30 mL  30 mL Oral Q6H PRN Fransisca Kaufmann A, NP      . amantadine (SYMMETREL) capsule 100 mg  100 mg Oral BID Malvin Johns, MD   100 mg at 01/16/19 0748  . carbamazepine (TEGRETOL) chewable tablet 200 mg  200 mg Oral TID Malvin Johns, MD      . fluPHENAZine (PROLIXIN) tablet 10 mg  10 mg Oral TID Malvin Johns, MD   10 mg at 01/16/19 0748  .  ibuprofen (ADVIL) tablet 600 mg  600 mg Oral Q8H PRN Fransisca Kaufmann A, NP      . LORazepam (ATIVAN) tablet 2 mg  2 mg Oral Q4H PRN Malvin Johns, MD       Or  . LORazepam (ATIVAN) injection 2 mg  2 mg Intramuscular Q4H PRN Malvin Johns, MD      . magnesium hydroxide (MILK OF MAGNESIA) suspension 30 mL  30 mL Oral Daily PRN Fransisca Kaufmann A, NP      . temazepam (RESTORIL) capsule 30 mg  30 mg Oral QHS Malvin Johns, MD   30 mg at 01/15/19 2053    Lab Results: No results found for this or any previous visit (from the past 48 hour(s)).  Blood Alcohol level:  Lab Results  Component Value Date   ETH <10 01/13/2019   ETH <10 10/28/2018    Metabolic Disorder Labs: Lab Results  Component Value Date   HGBA1C 4.7 (L) 10/29/2018   MPG 88.19 10/29/2018   MPG 96.8 06/20/2018   Lab Results  Component Value Date   PROLACTIN 33.3 (H) 10/25/2016   PROLACTIN 66.9 (H) 12/03/2015   Lab Results  Component Value Date   CHOL 112 10/29/2018   TRIG 59 10/29/2018   HDL 45 10/29/2018   CHOLHDL 2.5 10/29/2018   VLDL 12 10/29/2018   LDLCALC 55 10/29/2018   LDLCALC 65 06/20/2018    Physical Findings: AIMS: Facial and Oral Movements Muscles of Facial Expression: None, normal Lips and Perioral Area: None, normal Jaw: None, normal Tongue: None, normal,Extremity Movements Upper (arms, wrists, hands, fingers): None, normal Lower (legs, knees, ankles, toes): None, normal, Trunk Movements Neck, shoulders, hips: None, normal, Overall Severity Severity of abnormal movements (highest score from questions above): None, normal Incapacitation due to abnormal movements: None, normal Patient's awareness of abnormal movements (rate only patient's report): No Awareness, Dental Status Current problems with teeth and/or dentures?: No Does patient usually wear dentures?: No  CIWA:    COWS:     Musculoskeletal: Strength & Muscle Tone: within normal limits Gait & Station: normal Patient leans: [name]  Psychiatric  Specialty Exam: Physical Exam  ROS  Blood pressure 109/69, pulse 94, temperature 97.8 F (36.6 C), temperature source Oral, resp. rate 18, height 5\' 11"  (1.803 m), weight 52.2 kg, SpO2 99 %, unknown if currently breastfeeding.Body mass index is 16.04 kg/m.  General Appearance: Casual  Eye Contact:  Good  Speech:  Clear and Coherent  Volume:  Increased  Mood:  labile- variable  Affect:  Congruent  Thought Process:  Irrelevant and Descriptions of Associations: Loose  Orientation:  Full (Time, Place, and Person)  Thought Content:  Illogical and Tangential  Suicidal Thoughts:  No  Homicidal Thoughts:  No  Memory:  Immediate;   Fair Recent;   Fair Remote;   Fair  Judgement:  Impaired  Insight:  Fair  Psychomotor Activity:  Normal  Concentration:  Concentration: Fair  Recall:  [employer] of Knowledge:  Fair  Language:  Fair  Akathisia:  Negative  Handed:  Right  AIMS (if indicated):     Assets:  Leisure Time Physical Health  ADL's:  Intact  Cognition:  WNL  Sleep:  Number of Hours: 7.75     Treatment Plan Summary: Daily contact with patient to assess and evaluate symptoms and progress in treatment and Medication management  Continue Prolixin also continue long-acting injectable we will again discuss placement issues with social work and family continue current precautions and reality based therapy basic risk-benefit side effects discussed  Johnn Hai, MD 01/16/2019, 7:59 AM

## 2019-01-17 MED ORDER — AMANTADINE HCL 100 MG PO CAPS: 100.0000 mg | ORAL_CAPSULE | Freq: Two times a day (BID) | ORAL | 0 refills | Status: DC

## 2019-01-17 MED ORDER — ARIPIPRAZOLE ER 400 MG IM SRER
400.0000 mg | INTRAMUSCULAR | Status: DC
  Administered 2019-01-17: 11:00:00 400 mg via INTRAMUSCULAR

## 2019-01-17 MED ORDER — ARIPIPRAZOLE ER 400 MG IM SRER: 400.0000 mg | INTRAMUSCULAR | 11 refills | Status: DC

## 2019-01-17 MED ORDER — CARBAMAZEPINE 100 MG PO CHEW: CHEWABLE_TABLET | ORAL | 1 refills | Status: DC

## 2019-01-17 MED ORDER — FLUPHENAZINE HCL 10 MG PO TABS: ORAL_TABLET | ORAL | 2 refills | Status: DC

## 2019-01-17 MED ORDER — AMANTADINE HCL 100 MG PO CAPS: 100.0000 mg | ORAL_CAPSULE | Freq: Two times a day (BID) | ORAL | 2 refills | Status: DC

## 2019-01-17 NOTE — Progress Notes (Signed)
Recreation Therapy Notes  Date: 8.28.20 Time: 1000 Location: 500 Hall Dayroom  Group Topic: Communication  Goal Area(s) Addresses:  Patient will effectively communicate with peers in group.  Patient will verbalize benefit of healthy communication. Patient will verbalize positive effect of healthy communication on post d/c goals.  Patient will identify communication techniques that made activity effective for group.   Behavioral Response: Engaged  Intervention:  Paper, pencils, geometrical shape pictures   Activity: Back to Back Drawings.  Due to COVID-19 restrictions, this activity has been modified.  One person from the group was given a picture to describe to the remaining group members.  The remaining patients were to draw the picture as it was described to them.  The group could not as the speaker any detailed questions.  The only question the group could ask of the speaker was, "could you repeat that".  The speaker could describe the picture in as much detail as possible.    Education: Communication, Discharge Planning  Education Outcome: Acknowledges understanding/In group clarification offered/Needs additional education.   Clinical Observations/Feedback: Pt was active and somewhat focused.  Pt expressed she couldn't understand the first presenter which led to her not being able to fully follow the instructions.  As the speaker, pt felt she did "pretty alright".  Pt had to have some redirection because she would get off track.     Victorino Sparrow, LRT/CTRS   Victorino Sparrow A 01/17/2019 10:48 AM

## 2019-01-17 NOTE — Progress Notes (Signed)
Recreation Therapy Notes  INPATIENT RECREATION TR PLAN  Patient Details Name: Amy [name] MRN: [id number] DOB: 1995/11/21 Today's Date: 01/17/2019  Rec Therapy Plan Is patient appropriate for Therapeutic Recreation?: Yes Treatment times per week: about 3 days Estimated Length of Stay: 5-7 days TR Treatment/Interventions: Group participation (Comment)  Discharge Criteria Pt will be discharged from therapy if:: Discharged Treatment plan/goals/alternatives discussed and agreed upon by:: Patient/family  Discharge Summary Short term goals set: See patient care plan Short term goals met: Complete Progress toward goals comments: Groups attended Which groups?: Self-esteem, Communication, Other (Comment)(Team building) Reason goals not met: None Therapeutic equipment acquired: [name] Reason patient discharged from therapy: Discharge from hospital Pt/family agrees with progress & goals achieved: Yes Date patient discharged from therapy: 01/17/19    Victorino Sparrow, LRT/CTRS  [name], [name] 01/17/2019, 11:51 AM

## 2019-01-17 NOTE — [facility] Suicide Risk Assessment (Addendum)
[facility] INPATIENT:  Family/Significant Other Suicide Prevention Education  Suicide Prevention Education:  Contact Attempts: Pt's aunt, Amy [name],  has been identified by the patient as the family member/significant other with whom the patient will be residing, and identified as the person(s) who will aid the patient in the event of a mental health crisis.  With written consent from the patient, two attempts were made to provide suicide prevention education, prior to and/or following the patient's discharge.  We were unsuccessful in providing suicide prevention education.  A suicide education pamphlet was given to the patient to share with family/significant other.  Date and time of first attempt: 01/17/2019 @ 8:50am. CSW left a voicemail.  Date and time of second attempt: 01/17/2019 @ 9:50am.   [name] 01/17/2019, 8:59 AM

## 2019-01-17 NOTE — Plan of Care (Signed)
Pt was able to demonstrate no more than 2 impulsive behaviors at completion of recreation therapy group sessions.  Desean Heemstra, LRT/CTRS 

## 2019-01-17 NOTE — Discharge Summary (Signed)
Physician Discharge Summary Note  Patient:  Amy [name] is an 23 y.o., female MRN:  [id number]  DOB:  1996-02-20  Patient phone:  [phone] (home)   Patient address:   [address] [place] [address],   Total Time spent with patient: Greater than 30 minutes  Date of Admission:  01/14/2019  Date of Discharge: 01-17-19  Reason for Admission: Worsening symptoms of Schizophrenia & bizarre behaviors.  Principal Problem: Schizophrenia [facility])  Discharge Diagnoses: Patient Active Problem List   Diagnosis Date Noted  . Schizophrenia ([place]) [F20.9] 10/28/2018    Priority: High  . Schizoaffective psychosis ([place]) [F25.9] 05/18/2018  . Cannabis abuse [F12.10] 05/04/2018  . Amphetamine abuse ([place]) [F15.10] 11/14/2017  . Substance induced mood disorder ([place]) [F19.94] 11/14/2017  . 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 Substance abuse ([place]) [F19.10]   . Schizoaffective disorder, bipolar type ([place]) [F25.0] 04/22/2017  . Cocaine abuse with cocaine-induced mood disorder ([place]) [F14.14] 12/16/2016  . Bipolar disorder, curr episode mixed, severe, with psychotic features ([place]) [F31.64] 10/25/2016  . Cocaine use disorder, mild, abuse ([place]) [F14.10] 10/25/2016  . Cannabis use disorder, severe, dependence ([place]) [F12.20] 10/25/2016  . Insomnia [G47.00]   . Anxiety state [F41.1]   . Cocaine abuse ([place]) [F14.10] 07/04/2015  . Cannabis abuse, continuous [F12.10] 07/02/2015  . Non compliance with medical treatment [Z91.19] 06/18/2015  . Bipolar I disorder, current or most recent episode manic, with psychotic features ([place]) [F31.2] 03/02/2015   Past Psychiatric History: Bipolar disorder, mixed episodes, Cocaine use disorder, Cannabis use disorder.  Past Medical History:  Past Medical History:  Diagnosis Date  . Asthma   . Cannabis abuse   . Depression   . Methamphetamine abuse ([place])   . Schizo affective schizophrenia ([place])    History  reviewed. No pertinent surgical history. Family History:  Family History  Problem Relation Age of Onset  . Mental illness Neg Hx    Family Psychiatric  History: See H&P  Social History:  Social History   Substance and Sexual Activity  Alcohol Use Not Currently     Social History   Substance and Sexual Activity  Drug Use Yes  . Types: Marijuana    Social History   Socioeconomic History  . Marital status: Single    Spouse name: Not on file  . Number of children: Not on file  . Years of education: 65  . Highest education level: 12th grade  Occupational History  . Occupation: Unemployed  Social Needs  . Financial resource strain: Not on file  . Food insecurity    Worry: Not on file    Inability: Not on file  . Transportation needs    Medical: Not on file    Non-medical: Not on file  Tobacco Use  . Smoking status: Current Some Day Smoker    Packs/day: 0.50    Types: Cigarettes  . Smokeless tobacco: Never Used  Substance and Sexual Activity  . Alcohol use: Not Currently  . Drug use: Yes    Types: Marijuana  . Sexual activity: Not Currently    Birth control/protection: None  Lifestyle  . Physical activity    Days per week: Not on file    Minutes per session: Not on file  . Stress: Not on file  Relationships  . Social Herbalist on phone: Not on file    Gets together: Not on file    Attends religious service: Not on file  Active member of club or organization: Not on file    Attends meetings of clubs or organizations: Not on file    Relationship status: Not on file  Other Topics Concern  . Not on file  Social History Narrative   Pt is unemployed.  She stated that she lives in [place].  Due to altered mental status, difficult to obtain history.  Per previous history, Pt lives with her parents.   Hospital Course: (Per Md's admission notes): Patient is certainly well-known to this and many other healthcare systems and the triad, she is 23  years of age she is chronically homeless, this is later numerous petition for involuntary commitment.  Once again her drug screen is positive for methamphetamines, also positive for benzodiazepines but she has a long history of polysubstance abuse which has induced and then continually exacerbated her untreated bipolar type condition.  At times she has been diagnosed with bipolar mania with psychosis at other times schizoaffective disorder bipolar type both complicated by substance abuse at any rate the treatment is the same.  She is noncompliant with her long-acting injectable aripiprazole.  She has a chronic history of noncompliance as discussed.  Currently she was found at a local hotel acting very bizarrely and police/EMS were called.  She was brought to the emerge department on 8/23 during that time she required frequent chemical restraint frequent redirection was at times volatile at other times simply manic without particular volatility. She made numerous delusional and bizarre statements she does not recall at this point in time.  This is one of several discharge summaries for Haizel, a 23 year old female. She is well known in this [facility] from previous hospitalizations for worsening psychosis & polysubstance abuse issues. She was last discharged from this psychiatric hospital on 10-21-18 after receiving mood stabilization treatments. She was discharged with medications & recommendations for an outpatient psychiatric services, medication management & substance abuse treatment. However, she has been hospitalized, treated & discharged from other psychiatric hospitals with the area since June of 2020 per chart review report. Madalin is chronically homeless.    After the above current admission assessment, Joselynne's presenting symptoms were identified. The medication regimen targeting those symptoms were discussed & initiated. Her UDS was positive for Amphetamine & benzodiazepine. However, she was not  presenting with any substance withdrawal symptoms, as a result did not receive any detoxification treatment. She did receive mood stabilization treatments using the medications as listed on the discharge medication lists. She was enrolled in the group counseling sessions being offered & held on this unit. She participated & learned coping skills that should help her cope better after discharge to maintain safety & mood stability.   And because Zuleica has not been able to achieve symptoms control under an antipsychotic monotherapy, she is currently receiving and being discharged on 2 separate antipsychotic medications Abilify Maintenna monthly injectable & fluphenazine tabs. These 2 combination antipsychotic therapies seem effective in stabilizing her symptoms at this time. It will benefit Chane to continue these combination antipsychotic therapies as recommended. However, as her symptoms continue to improve, she may then be titrated down to an antipsychotic monotherapy. This is to prevent the chances for the development of metabolic syndrome usually associated with use of multiple antipsychotic regimen. This has to be done within the proper monitoring, discretion & judgement of her outpatient psychiatric provider.  Mesa has met with her attending psychiatrist this am. Her reason for admission, treatment plan & response to treatment discussed. She endorsed  that her symptoms have improved. She denies any new issues or symptoms. It was then decided that she is stable for discharge to continue psychiatric care, medication management & substance abuse treatment on an outpatient basis as noted below. She is provided with all the necessary information needed to make this appointment without problems.  Upon discharge, Milli denies any SIHI, delusional thoughts or paranoia. She was provided with a 7 days worth, supply samples of her [facility] discharge medications. She left [facility] with all personal belongings in no  apparent distress. Transportation per family. Marland Kitchen  Physical Findings: AIMS: Facial and Oral Movements Muscles of Facial Expression: None, normal Lips and Perioral Area: None, normal Jaw: None, normal Tongue: None, normal,Extremity Movements Upper (arms, wrists, hands, fingers): None, normal Lower (legs, knees, ankles, toes): None, normal, Trunk Movements Neck, shoulders, hips: None, normal, Overall Severity Severity of abnormal movements (highest score from questions above): None, normal Incapacitation due to abnormal movements: None, normal Patient's awareness of abnormal movements (rate only patient's report): No Awareness, Dental Status Current problems with teeth and/or dentures?: No Does patient usually wear dentures?: No  CIWA:    COWS:     Musculoskeletal: Strength & Muscle Tone: within normal limits Gait & Station: normal Patient leans: [name]  Psychiatric Specialty Exam: Physical Exam  Nursing note and vitals reviewed. Constitutional: She is oriented to person, place, and time. She appears well-developed.  HENT:  Head: Normocephalic.  Eyes: Pupils are equal, round, and reactive to light.  Neck: Normal range of motion.  Cardiovascular: Normal rate.  Respiratory: Effort normal.  GI: Soft.  Genitourinary:    Genitourinary Comments: Deferred   Musculoskeletal: Normal range of motion.  Neurological: She is alert and oriented to person, place, and time.  Skin: Skin is warm and dry.    Review of Systems  Constitutional: Negative.   HENT: Negative.   Eyes: Negative.   Respiratory: Negative.   Cardiovascular: Negative.   Gastrointestinal: Negative.   Genitourinary: Negative.   Musculoskeletal: Negative.   Skin: Negative.   Neurological: Negative.   Endo/Heme/Allergies: Negative.   Psychiatric/Behavioral: Positive for depression (Stabilized with medication prior to discharge), hallucinations (Hx. Psychosis (Stabilized with medication prior to discharge)) and substance  abuse ( Hx. Polysubstance use disorder). Negative for memory loss and suicidal ideas. The patient has insomnia (Stabilized with medication prior to discharge). The patient is not nervous/anxious (Stable).     Blood pressure 119/71, pulse 67, temperature 97.9 F (36.6 C), temperature source Oral, resp. rate 18, height _0  (1.803 m), weight 52.2 kg, SpO2 99 %, unknown if currently breastfeeding.Body mass index is 16.04 kg/m.  See Md's discharge SRA   Has this patient used any form of tobacco in the last 30 days? (Cigarettes, Smokeless Tobacco, Cigars, and/or Pipes): Yes, provided with a nicorette gum prescription upon discharge for smoking cessation  Blood Alcohol level:  Lab Results  Component Value Date   ETH <10 01/13/2019   ETH <10 28/36/6294   Metabolic Disorder Labs:  Lab Results  Component Value Date   HGBA1C 4.7 (L) 10/29/2018   MPG 88.19 10/29/2018   MPG 96.8 06/20/2018   Lab Results  Component Value Date   PROLACTIN 33.3 (H) 10/25/2016   PROLACTIN 66.9 (H) 12/03/2015   Lab Results  Component Value Date   CHOL 112 10/29/2018   TRIG 59 10/29/2018   HDL 45 10/29/2018   CHOLHDL 2.5 10/29/2018   VLDL 12 10/29/2018   LDLCALC 55 10/29/2018   LDLCALC 65 06/20/2018   See  Psychiatric Specialty Exam and Suicide Risk Assessment completed by Attending Physician prior to discharge.  Discharge destination:  Home  Is patient on multiple antipsychotic therapies at discharge:  Yes,   Do you recommend tapering to monotherapy for antipsychotics?  Yes   Has Patient had three or more failed trials of antipsychotic monotherapy by history:  Yes,   Antipsychotic medications that previously failed include:   1.  Abilify., 2.  Fluphenazine. and 3.  Haldol.  Recommended Plan for Multiple Antipsychotic Therapies: Additional reason(s) for multiple antispychotic treatment:  Patient's symptoms has failed to appropriately respond to an antipsychotic monotherapy on several  occasions.  Allergies as of 01/17/2019      Reactions   Penicillins Rash   Has patient had a PCN reaction causing immediate rash, facial/tongue/throat swelling, SOB or lightheadedness with hypotension: Yes Has patient had a PCN reaction causing severe rash involving mucus membranes or skin necrosis: No Has patient had a PCN reaction that required hospitalization: No Has patient had a PCN reaction occurring within the last 10 years: Yes If all of the above answers are "NO", then may    Cogentin [benztropine] Itching   Divalproex Sodium Other (See Comments)   Creates feelings of paranoia, some suicidal feelings, and makes her feel that "people are coming after" her Creates feelings of paranoia, some suicidal feelings, and makes her feel that "people are coming after" her   Risperidone Other (See Comments)   "Makes me cough"   Valproic Acid Other (See Comments)   Creates paranoia/Per [place] paranoia/Per [place]      Medication List    TAKE these medications     Indication  amantadine 100 MG capsule Commonly known as: SYMMETREL Take 1 capsule (100 mg total) by mouth 2 (two) times daily. For prevention of drug induced tremors What changed: additional instructions  Indication: Extrapyramidal Reaction caused by Medications   ARIPiprazole ER 400 MG Srer injection Commonly known as: ABILIFY MAINTENA Inject 2 mLs (400 mg total) into the muscle every 28 (twenty-eight) days. Due 02/17/19: For mood control What changed: additional instructions  Indication: Schizophrenia   carbamazepine 100 MG chewable tablet Commonly known as: TEGRETOL 1 in am 2 at hs  Indication: Manic-Depression   fluPHENAZine 10 MG tablet Commonly known as: PROLIXIN 1 in am 2 at h s What changed:   how much to take  how to take this  when to take this  additional instructions  Indication: Psychosis      Follow-up [place] Follow up  on 02/14/2019.   Why: Primary care appointment with Dr. Chapman Fitch is Friday, 9/25 at 2:50p.  Please bring your photo ID, insurance card, and current medications. Contact information: [place] [id number] [place], [facility] Follow up.   Contact information: [place] [address] [place] [address] [phone]          Follow-up recommendations: Activity:  As tolerated Diet: As recommended by your primary care doctor. Keep all scheduled follow-up appointments as recommended.   Comments: Patient is instructed prior to discharge to: Take all medications as prescribed by his/her mental healthcare provider. Report any adverse effects and or reactions from the medicines to his/her outpatient provider promptly. Patient has been instructed & cautioned: To not engage in alcohol and or illegal drug use while on prescription medicines. In the event of worsening symptoms, patient is  instructed to call the crisis hotline, 911 and or go to the nearest ED for appropriate evaluation and treatment of symptoms. To follow-up with his/her primary care provider for your other medical issues, concerns and or health care needs.   Signed: Lindell Spar, NP, PMHNP, FNP-BC 01/17/2019, 9:48 AM

## 2019-01-17 NOTE — Plan of Care (Signed)
Discharge note  Patient verbalizes readiness for discharge. Follow up plan explained, AVS, Transition record and SRA given. Prescriptions and teaching provided. Belongings returned and signed for. Suicide safety plan completed and signed. Patient verbalizes understanding. Patient denies SI/HI and assures this writer she will seek assistance should that change. Patient discharged to lobby where pt was instructed to wait for lyft driver.  Problem: Education: Goal: Knowledge of [facility] Education information/materials will improve Outcome: Adequate for Discharge Goal: Emotional status will improve Outcome: Adequate for Discharge Goal: Mental status will improve Outcome: Adequate for Discharge Goal: Verbalization of understanding the information provided will improve Outcome: Adequate for Discharge   Problem: Activity: Goal: Interest or engagement in activities will improve Outcome: Adequate for Discharge Goal: Sleeping patterns will improve Outcome: Adequate for Discharge   Problem: Coping: Goal: Ability to verbalize frustrations and anger appropriately will improve Outcome: Adequate for Discharge Goal: Ability to demonstrate self-control will improve Outcome: Adequate for Discharge   Problem: Health Behavior/Discharge Planning: Goal: Identification of resources available to assist in meeting health care needs will improve Outcome: Adequate for Discharge Goal: Compliance with treatment plan for underlying cause of condition will improve Outcome: Adequate for Discharge   Problem: Physical Regulation: Goal: Ability to maintain clinical measurements within normal limits will improve Outcome: Adequate for Discharge   Problem: Safety: Goal: Periods of time without injury will increase Outcome: Adequate for Discharge   Problem: Education: Goal: Ability to state activities that reduce stress will improve Outcome: Adequate for Discharge   Problem: Coping: Goal: Ability to  identify and develop effective coping behavior will improve Outcome: Adequate for Discharge   Problem: Self-Concept: Goal: Ability to identify factors that promote anxiety will improve Outcome: Adequate for Discharge Goal: Level of anxiety will decrease Outcome: Adequate for Discharge Goal: Ability to modify response to factors that promote anxiety will improve Outcome: Adequate for Discharge   Problem: Activity: Goal: Will identify at least one activity in which they can participate Outcome: Adequate for Discharge   Problem: Coping: Goal: Ability to identify and develop effective coping behavior will improve Outcome: Adequate for Discharge Goal: Ability to interact with others will improve Outcome: Adequate for Discharge Goal: Demonstration of participation in decision-making regarding own care will improve Outcome: Adequate for Discharge Goal: Ability to use eye contact when communicating with others will improve Outcome: Adequate for Discharge   Problem: Health Behavior/Discharge Planning: Goal: Identification of resources available to assist in meeting health care needs will improve Outcome: Adequate for Discharge   Problem: Self-Concept: Goal: Will verbalize positive feelings about self Outcome: Adequate for Discharge   Problem: Activity: Goal: Will verbalize the importance of balancing activity with adequate rest periods Outcome: Adequate for Discharge   Problem: Education: Goal: Will be free of psychotic symptoms Outcome: Adequate for Discharge Goal: Knowledge of the prescribed therapeutic regimen will improve Outcome: Adequate for Discharge   Problem: Coping: Goal: Coping ability will improve Outcome: Adequate for Discharge Goal: Will verbalize feelings Outcome: Adequate for Discharge   Problem: Health Behavior/Discharge Planning: Goal: Compliance with prescribed medication regimen will improve Outcome: Adequate for Discharge   Problem: Nutritional: Goal:  Ability to achieve adequate nutritional intake will improve Outcome: Adequate for Discharge   Problem: Role Relationship: Goal: Ability to communicate needs accurately will improve Outcome: Adequate for Discharge Goal: Ability to interact with others will improve Outcome: Adequate for Discharge   Problem: Safety: Goal: Ability to redirect hostility and anger into socially appropriate behaviors will improve Outcome: Adequate for Discharge Goal:  Ability to remain free from injury will improve Outcome: Adequate for Discharge   Problem: Self-Care: Goal: Ability to participate in self-care as condition permits will improve Outcome: Adequate for Discharge   Problem: Self-Concept: Goal: Will verbalize positive feelings about self Outcome: Adequate for Discharge

## 2019-01-17 NOTE — Progress Notes (Signed)
D: Writer observed pt rolling her eyes at the writer while the Probation officer was speaking to other patients. Writer and other rn went into the report room, writer turned to see the pt squatting down, with her arm out and hands together to form the shape of a gun aimed at [employer]. Writer asked the pt if she needed some help. Pt stated, "don't mess around and get snipped". Several times tonight pt would approach while writer was speaking to someone and pt would laugh.She would start talking almost as if she were trying to get the person's attention from the writer.   A:  Support and encouragement was offered. 15 min checks continued for safety.  R: Pt remains safe.

## 2019-01-17 NOTE — BHH Group Notes (Signed)
[facility] LCSW Group Therapy Note  Date/Time: 01/17/2019 @ 11:30am  Type of Therapy/Topic:  Group Therapy:  Feelings about Diagnosis  Participation Level:  None   Mood: Asleep   Description of Group:    This group will allow patients to explore their thoughts and feelings about diagnoses they have received. Patients will be guided to explore their level of understanding and acceptance of these diagnoses. Facilitator will encourage patients to process their thoughts and feelings about the reactions of others to their diagnosis, and will guide patients in identifying ways to discuss their diagnosis with significant others in their lives. This group will be process-oriented, with patients participating in exploration of their own experiences as well as giving and receiving support and challenge from other group members.   Therapeutic Goals: 1. Patient will demonstrate understanding of diagnosis as evidence by identifying two or more symptoms of the disorder:  2. Patient will be able to express two feelings regarding the diagnosis 3. Patient will demonstrate ability to communicate their needs through discussion and/or role plays  Summary of Patient Progress:     Patient was in the group room but slept the entire group.    Therapeutic Modalities:   Cognitive Behavioral Therapy Brief Therapy Feelings Identification   Ardelle Anton, LCSW

## 2019-01-17 NOTE — BHH Suicide Risk Assessment (Signed)
[facility] Discharge Suicide Risk Assessment   Principal Problem: Bipolar mania with psychosis complicated by substance abuse and chronic noncompliance Discharge Diagnoses: Active Problems:   Bipolar I disorder, current or most recent episode manic, with psychotic features ([place])   Schizophrenia ([place])   Total Time spent with patient: 45 minutes  Musculoskeletal: Strength & Muscle Tone: within normal limits Gait & Station: normal Patient leans: [name]  Psychiatric Specialty Exam: ROS  Blood pressure 119/71, pulse 67, temperature 97.9 F (36.6 C), temperature source Oral, resp. rate 18, height 5\' 11"  (1.803 m), weight 52.2 kg, SpO2 99 %, unknown if currently breastfeeding.Body mass index is 16.04 kg/m.  General Appearance: Casual  Eye Contact::  Good  Speech:  Clear and Coherent409  Volume:  Normal  Mood:  Euthymic  Affect:  Full Range  Thought Process:  Coherent, Goal Directed and Descriptions of Associations: Circumstantial  Orientation:  Full (Time, Place, and Person)  Thought Content:  Logical  Suicidal Thoughts:  No  Homicidal Thoughts:  No  Memory:  Immediate;   Fair Recent;   Fair  Judgement:  Poor  Insight:  Shallow  Psychomotor Activity:  Normal  Concentration:  Fair  Recall:  [employer] of Knowledge:Fair  Language: Fair  Akathisia:  Negative  Handed:  Right  AIMS (if indicated):     Assets:  Communication Skills Desire for Improvement Leisure Time Physical Health Resilience Social Support  Sleep:  Number of Hours: 7.75  Cognition: WNL  ADL's:  Intact   Mental Status Per Nursing Assessment::   On Admission:  NA  Demographic Factors:  Unemployed  Loss Factors: Decrease in vocational status  Historical Factors: Impulsivity  Risk Reduction Factors:   Sense of responsibility to family, Religious beliefs about death and Positive social support  Continued Clinical Symptoms:  Alcohol/Substance Abuse/Dependencies  Cognitive Features That Contribute To Risk:   Loss of executive function   Her greatest risk is for a non-intentional overdose  suicide Risk:  Minimal: No identifiable suicidal ideation.  Patients presenting with no risk factors but with morbid ruminations; may be classified as minimal risk based on the severity of the depressive symptoms  [facility]-up [facility] Follow up.   Contact information: [place] [id number] [phone]          Plan Of Care/Follow-up recommendations:  Activity:  full  Kandy Towery, MD 01/17/2019, 8:15 AM

## 2019-01-17 NOTE — Progress Notes (Signed)
Pt refused to go to her room to sleep, so instead slept on the hall floor. Pt moved from the telephone area, to the end of the hall, to in front of the nurse's station.

## 2019-01-17 NOTE — Tx Team (Signed)
Interdisciplinary Treatment and Diagnostic Plan Update  01/17/2019 Time of Session: 10:00am Amy [name] MRN: [id number]  Principal Diagnosis: Schizophrenia [facility])  Secondary Diagnoses: Principal Problem:   Schizophrenia ([place]) Active Problems:   Bipolar I disorder, current or most recent episode manic, with psychotic features ([place])   Current Medications:  Current Facility-Administered Medications  Medication Dose Route Frequency Provider Last Rate Last Dose  . acetaminophen (TYLENOL) tablet 650 mg  650 mg Oral Q6H PRN Niel Hummer, NP      . alum & mag hydroxide-simeth (MAALOX/MYLANTA) 200-200-20 MG/5ML suspension 30 mL  30 mL Oral Q6H PRN Elmarie Shiley A, NP      . amantadine (SYMMETREL) capsule 100 mg  100 mg Oral BID Johnn Hai, MD   100 mg at 01/17/19 0831  . ARIPiprazole ER (ABILIFY MAINTENA) injection 400 mg  400 mg Intramuscular Q28 days Johnn Hai, MD      . carbamazepine (TEGRETOL) chewable tablet 200 mg  200 mg Oral TID Johnn Hai, MD   200 mg at 01/17/19 0831  . fluPHENAZine (PROLIXIN) tablet 10 mg  10 mg Oral TID Johnn Hai, MD   10 mg at 01/17/19 0831  . ibuprofen (ADVIL) tablet 600 mg  600 mg Oral Q8H PRN Elmarie Shiley A, NP      . LORazepam (ATIVAN) tablet 2 mg  2 mg Oral Q4H PRN Johnn Hai, MD   2 mg at 01/16/19 1654   Or  . LORazepam (ATIVAN) injection 2 mg  2 mg Intramuscular Q4H PRN Johnn Hai, MD      . magnesium hydroxide (MILK OF MAGNESIA) suspension 30 mL  30 mL Oral Daily PRN Elmarie Shiley A, NP      . temazepam (RESTORIL) capsule 30 mg  30 mg Oral QHS Johnn Hai, MD   30 mg at 01/15/19 2053   PTA Medications: Medications Prior to Admission  Medication Sig Dispense Refill Last Dose  . [DISCONTINUED] amantadine (SYMMETREL) 100 MG capsule Take 1 capsule (100 mg total) by mouth 2 (two) times daily for 30 days. (Patient not taking: Reported on 01/12/2019) 60 capsule 0   . [DISCONTINUED] ARIPiprazole ER (ABILIFY MAINTENA) 400 MG SRER injection Inject  2 mLs (400 mg total) into the muscle every 28 (twenty-eight) days. Due 7/9 (Patient not taking: Reported on 01/12/2019) 1 each 0   . [DISCONTINUED] fluPHENAZine (PROLIXIN) 10 MG tablet Take 1 tablet (10 mg total) by mouth 3 (three) times daily. 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   Physician Treatment Plan for Primary Diagnosis: Schizophrenia ([place]) Long Term Goal(s): Improvement in symptoms so as ready for discharge Improvement in symptoms so as ready for discharge   Short Term Goals: Ability to demonstrate self-control will improve Ability to identify and develop effective coping behaviors will improve Ability to maintain clinical measurements within normal limits will improve Compliance with prescribed medications will improve Ability to identify triggers associated with substance abuse/mental health issues will improve Ability to verbalize feelings will improve Ability to disclose and discuss suicidal ideas Ability to demonstrate self-control will improve Ability to identify and develop effective coping behaviors will improve  Medication Management: Evaluate patient's response, side effects, and tolerance of medication regimen.  Therapeutic Interventions: 1 to 1 sessions,  Unit Group sessions and Medication administration.  Evaluation of Outcomes: Adequate for Discharge  Physician Treatment Plan for Secondary Diagnosis: Principal Problem:   Schizophrenia (H[facility]) Active Problems:   Bipolar I disorder, current or most recent episode manic, with psychotic features ([facility])  Long Term Goal(s): Improvement in symptoms so as  ready for discharge Improvement in symptoms so as ready for discharge   Short Term Goals: Ability to demonstrate self-control will improve Ability to identify and develop effective coping behaviors will improve Ability to maintain clinical measurements within normal limits will improve Compliance with prescribed medications will improve Ability to identify triggers associated with substance abuse/mental health issues will improve Ability to verbalize feelings will improve Ability to disclose and discuss suicidal ideas Ability to demonstrate self-control will improve Ability to identify and develop effective coping behaviors will improve     Medication Management: Evaluate patient's response, side effects, and tolerance of medication regimen.  Therapeutic Interventions: 1 to 1 sessions, Unit Group sessions and Medication administration.  Evaluation of Outcomes: Adequate for Discharge   RN Treatment Plan for Primary Diagnosis: Schizophrenia (HCC) Long Term Goal(s): Knowledge of disease and therapeutic regimen to maintain health will improve  Short Term Goals: Ability to participate in decision making will improve, Ability to verbalize feelings will improve, Ability to disclose and discuss suicidal ideas, Ability to identify and develop effective coping behaviors will improve and Compliance with prescribed medications will improve  Medication Management: RN will administer medications as ordered by provider, will assess and evaluate patient's response and provide education to patient for prescribed medication. RN will report any adverse and/or side effects to prescribing provider.  Therapeutic Interventions: 1 on 1 counseling sessions, Psychoeducation, Medication administration, Evaluate responses to treatment, Monitor vital signs and CBGs as ordered, Perform/monitor CIWA, COWS, AIMS and Fall Risk screenings as ordered, Perform wound care treatments as ordered.  Evaluation of Outcomes:  Adequate for Discharge   LCSW Treatment Plan for Primary Diagnosis: Schizophrenia [facility](HCC) Long Term Goal(s): Safe transition to appropriate next level of care at discharge, Engage patient in therapeutic group addressing interpersonal concerns.  Short Term Goals: Engage patient in aftercare planning with referrals and resources and Increase skills for wellness and recovery  Therapeutic Interventions: Assess for all discharge needs, 1 to 1 time with Social worker, Explore available resources and support systems, Assess for adequacy in community support network, Educate family and significant other(s) on suicide prevention, Complete Psychosocial Assessment, Interpersonal group therapy.  Evaluation of Outcomes: Adequate for Discharge   Progress in Treatment: Attending groups: Yes. Participating in groups: Yes. Taking medication as prescribed: Yes. Toleration medication: Yes. Family/Significant other contact made: No, will contact:  CSW attempted to contact pt's aunt; twice Patient understands diagnosis: No. Discussing patient identified problems/goals with staff: Yes. Medical problems stabilized or resolved: Yes. Denies suicidal/homicidal ideation: Yes. Issues/concerns per patient self-inventory: No. Other:   New problem(s) identified: No, Describe:  None  New Short Term/Long Term Goal(s): Medication stabilization, elimination of SI thoughts, and development of a comprehensive mental wellness plan.   Patient Goals:  "Get a job and [place]"  Discharge Plan or Barriers: Patient will be discharging home to her mother and will continue with her [facility] ACTT team and will be [facility].   Reason for Continuation of Hospitalization: Patient is discharging today.   Estimated Length of Stay: Patient is discharging today.   Attendees: Patient: 01/17/2019   Physician: Dr. Malvin JohnsBrian Farah, MD 01/17/2019  Nursing: Jan, RN 01/17/2019   RN  Care Manager: 01/17/2019  Social Worker: Stephannie Peters, [place] 01/17/2019   Recreational Therapist:  01/17/2019   Other: Intern: Earlyne Iba, MSW intern 01/17/2019  Other:  01/17/2019   Other: 01/17/2019     Scribe for Treatment Team: Delphia Grates, LCSW 01/17/2019 10:45 AM

## 2019-01-17 NOTE — Progress Notes (Signed)
  [facility] Adult Case Management Discharge Plan :  Will you be returning to the same living situation after discharge:  Yes,  pt's mother At discharge, do you have transportation home?: Yes,  pt's mother Do you have the ability to pay for your medications: Yes,  ACTT team  Release of information consent forms completed and in the chart;  Patient's signature needed at discharge.  Patient to Follow up at: Follow-up [facility] Follow up on 02/14/2019.   Why: Primary care appointment with Dr. Chapman Fitch is Friday, 9/25 at 2:50p.  Please bring your photo ID, insurance card, and current medications. Contact information: [place] [phone] [place], [facility] Follow up.   Why: TEFL teacher attempted to Clinical cytogeneticist. Please contact your ACTT team after you discharge for the continuation of services. Contact information: [address] [place] [address] [phone]           Next level of care provider has access to [place] and Suicide Prevention discussed: No.; CSW attempted twice to contact pt's aunt     Has patient been referred to the Quitline?: Patient refused referral  Patient has been referred for addiction treatment: Yes  [name], LCSW 01/17/2019, 10:34 AM

## 2019-02-14 ENCOUNTER — Ambulatory Visit: Payer: Self-pay | Admitting: Family Medicine

## 2019-04-06 ENCOUNTER — Emergency Department (HOSPITAL_COMMUNITY)
Admission: EM | Admit: 2019-04-06 | Discharge: 2019-04-07 | Disposition: A | Discharge status: Home / Self Care | Payer: Self-pay | Attending: Emergency Medicine | Admitting: Emergency Medicine

## 2019-04-06 ENCOUNTER — Other Ambulatory Visit: Payer: Self-pay

## 2019-04-06 ENCOUNTER — Encounter (HOSPITAL_COMMUNITY): Payer: Self-pay | Admitting: Emergency Medicine

## 2019-04-06 DIAGNOSIS — F17210 Nicotine dependence, cigarettes, uncomplicated: Secondary | ICD-10-CM | POA: Insufficient documentation

## 2019-04-06 DIAGNOSIS — F1414 Cocaine abuse with cocaine-induced mood disorder: Secondary | ICD-10-CM | POA: Diagnosis present

## 2019-04-06 DIAGNOSIS — Z79899 Other long term (current) drug therapy: Secondary | ICD-10-CM | POA: Insufficient documentation

## 2019-04-06 DIAGNOSIS — F1994 Other psychoactive substance use, unspecified with psychoactive substance-induced mood disorder: Secondary | ICD-10-CM | POA: Diagnosis present

## 2019-04-06 DIAGNOSIS — F1210 Cannabis abuse, uncomplicated: Secondary | ICD-10-CM | POA: Diagnosis present

## 2019-04-06 DIAGNOSIS — F12150 Cannabis abuse with psychotic disorder with delusions: Secondary | ICD-10-CM | POA: Diagnosis present

## 2019-04-06 DIAGNOSIS — F1510 Other stimulant abuse, uncomplicated: Secondary | ICD-10-CM | POA: Diagnosis present

## 2019-04-06 DIAGNOSIS — J45909 Unspecified asthma, uncomplicated: Secondary | ICD-10-CM | POA: Insufficient documentation

## 2019-04-06 DIAGNOSIS — F3010 Manic episode without psychotic symptoms, unspecified: Secondary | ICD-10-CM | POA: Insufficient documentation

## 2019-04-06 DIAGNOSIS — F1410 Cocaine abuse, uncomplicated: Secondary | ICD-10-CM | POA: Diagnosis present

## 2019-04-06 DIAGNOSIS — F22 Delusional disorders: Secondary | ICD-10-CM | POA: Insufficient documentation

## 2019-04-06 DIAGNOSIS — F250 Schizoaffective disorder, bipolar type: Secondary | ICD-10-CM | POA: Diagnosis present

## 2019-04-06 DIAGNOSIS — Z046 Encounter for general psychiatric examination, requested by authority: Secondary | ICD-10-CM | POA: Insufficient documentation

## 2019-04-06 LAB — COMPREHENSIVE METABOLIC PANEL
ALT: 21 U/L (ref 0–44)
AST: 42 U/L — ABNORMAL HIGH (ref 15–41)
Albumin: 4.4 g/dL (ref 3.5–5.0)
Alkaline Phosphatase: 46 U/L (ref 38–126)
Anion gap: 13 (ref 5–15)
BUN: 20 mg/dL (ref 6–20)
CO2: 20 mmol/L — ABNORMAL LOW (ref 22–32)
Calcium: 8.9 mg/dL (ref 8.9–10.3)
Chloride: 107 mmol/L (ref 98–111)
Creatinine, Ser: 0.96 mg/dL (ref 0.44–1.00)
GFR calc Af Amer: >60 mL/min (ref >60)
GFR calc non Af Amer: >60 mL/min (ref >60)
Glucose, Bld: 80 mg/dL (ref 70–99)
Potassium: 3.2 mmol/L — ABNORMAL LOW (ref 3.5–5.1)
Sodium: 140 mmol/L (ref 135–145)
Total Bilirubin: 1.3 mg/dL — ABNORMAL HIGH (ref 0.3–1.2)
Total Protein: 7.3 g/dL (ref 6.5–8.1)

## 2019-04-06 LAB — CBC WITH DIFFERENTIAL/PLATELET
Abs Immature Granulocytes: 0.03 10*3/uL (ref 0.00–0.07)
Basophils Absolute: 0 10*3/uL (ref 0.0–0.1)
Basophils Relative: 0 %
Eosinophils Absolute: 0 10*3/uL (ref 0.0–0.5)
Eosinophils Relative: 1 %
HCT: 38.4 % (ref 36.0–46.0)
Hemoglobin: 12.6 g/dL (ref 12.0–15.0)
Immature Granulocytes: 0 %
Lymphocytes Relative: 34 %
Lymphs Abs: 3 10*3/uL (ref 0.7–4.0)
MCH: 33.8 pg (ref 26.0–34.0)
MCHC: 32.8 g/dL (ref 30.0–36.0)
MCV: 102.9 fL — ABNORMAL HIGH (ref 80.0–100.0)
Monocytes Absolute: 0.9 10*3/uL (ref 0.1–1.0)
Monocytes Relative: 10 %
Neutro Abs: 4.8 10*3/uL (ref 1.7–7.7)
Neutrophils Relative %: 55 %
Platelets: 250 10*3/uL (ref 150–400)
RBC: 3.73 MIL/uL — ABNORMAL LOW (ref 3.87–5.11)
RDW: 13 % (ref 11.5–15.5)
WBC: 8.8 10*3/uL (ref 4.0–10.5)
nRBC: 0 % (ref 0.0–0.2)

## 2019-04-06 LAB — ETHANOL: Alcohol, Ethyl (B): <10 mg/dL (ref <10)

## 2019-04-06 MED ORDER — CARBAMAZEPINE 100 MG PO CHEW
100.0000 mg | CHEWABLE_TABLET | Freq: Two times a day (BID) | ORAL | Status: DC
  Administered 2019-04-06 – 2019-04-07 (×2): 100 mg via ORAL
  Filled 2019-04-06 (×3): qty 1

## 2019-04-06 MED ORDER — ACETAMINOPHEN 325 MG PO TABS: 650.0000 mg | ORAL_TABLET | ORAL | Status: DC | PRN

## 2019-04-06 MED ORDER — ZIPRASIDONE MESYLATE 20 MG IM SOLR: 20.0000 mg | INTRAMUSCULAR | Status: DC | PRN

## 2019-04-06 MED ORDER — LORAZEPAM 1 MG PO TABS
1.0000 mg | ORAL_TABLET | ORAL | Status: AC | PRN
  Administered 2019-04-06: 1 mg via ORAL
  Filled 2019-04-06: qty 1

## 2019-04-06 MED ORDER — ZIPRASIDONE MESYLATE 20 MG IM SOLR
20.0000 mg | Freq: Once | INTRAMUSCULAR | Status: AC
  Administered 2019-04-06: 16:00:00 20 mg via INTRAMUSCULAR
  Filled 2019-04-06: qty 20

## 2019-04-06 MED ORDER — FLUPHENAZINE HCL 5 MG PO TABS
10.0000 mg | ORAL_TABLET | Freq: Two times a day (BID) | ORAL | Status: DC
  Administered 2019-04-06 – 2019-04-07 (×2): 10 mg via ORAL
  Filled 2019-04-06 (×3): qty 2

## 2019-04-06 MED ORDER — ZOLPIDEM TARTRATE 5 MG PO TABS
5.0000 mg | ORAL_TABLET | Freq: Every evening | ORAL | Status: DC | PRN
  Administered 2019-04-06: 5 mg via ORAL
  Filled 2019-04-06: qty 1

## 2019-04-06 MED ORDER — NICOTINE 21 MG/24HR TD PT24
21.0000 mg | MEDICATED_PATCH | Freq: Every day | TRANSDERMAL | Status: DC
  Filled 2019-04-06: qty 1

## 2019-04-06 MED ORDER — POTASSIUM CHLORIDE CRYS ER 20 MEQ PO TBCR
40.0000 meq | EXTENDED_RELEASE_TABLET | Freq: Once | ORAL | Status: AC
  Administered 2019-04-06: 40 meq via ORAL
  Filled 2019-04-06: qty 2

## 2019-04-06 MED ORDER — ONDANSETRON HCL 4 MG PO TABS: 4.0000 mg | ORAL_TABLET | Freq: Three times a day (TID) | ORAL | Status: DC | PRN

## 2019-04-06 MED ORDER — OLANZAPINE 5 MG PO TBDP: 5.0000 mg | ORAL_TABLET | Freq: Three times a day (TID) | ORAL | Status: DC | PRN

## 2019-04-06 MED ORDER — STERILE WATER FOR INJECTION IJ SOLN
INTRAMUSCULAR | Status: AC
  Administered 2019-04-06: 16:00:00
  Filled 2019-04-06: qty 10

## 2019-04-06 MED ORDER — ALUM & MAG HYDROXIDE-SIMETH 200-200-20 MG/5ML PO SUSP: 30.0000 mL | Freq: Four times a day (QID) | ORAL | Status: DC | PRN

## 2019-04-06 NOTE — ED Notes (Addendum)
Pt was wobbly as she was escorted to the Acute Unit with staff close by. She is not alert enough to do the TTS consult at this time. In bed and to sleep immediately after medication administration. 15 minute checks initiated.

## 2019-04-06 NOTE — ED Notes (Signed)
Pt presented with 1 Pt Belonging Bag located in locker 37

## 2019-04-06 NOTE — Progress Notes (Signed)
   04/06/19 1715  General Assessment Data  Reason for not completing assessment Reed attempted to complete the assessment by contacting pt's nurse.  [facility] was informed by pt's nurse, Diane, RN "pt is not conscious enough to complete the assessment, pt was medicated pta."  TTS will attempt to complete assessment at a later time    Talayia Hjort L. [place], [place], [facility], [facility] Therapeutic Triage Specialist  [phone]

## 2019-04-06 NOTE — ED Provider Notes (Signed)
[facility]-EMERGENCY DEPT Provider Note   CSN: [id number] Arrival date & time: 04/06/19  1536     History   Chief Complaint Chief Complaint  Patient presents with  . Manic Behavior    HPI Amy [name] is a 23 y.o. female.     The history is provided by the police. No language interpreter was used.     23 year old female with history of bipolar, polysubstance abuse, schizoaffective psychosis [place] [employer] [place]. Per [employer], pt with bizarre behaviors, yelling and talking about aliens and making sexual references.  Hx limited as pt is uncooperative, due to underlying psychiatric illness.  Level V caveats applies.    Past Medical History:  Diagnosis Date  . Asthma   . Cannabis abuse   . Depression   . 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 Schizo affective schizophrenia [facility](HCC)     Patient Active Problem List   Diagnosis Date Noted  . Schizophrenia (HCC) 10/28/2018  . Schizoaffective psychosis (HCC) 05/18/2018  . Cannabis abuse 05/04/2018  . 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 Substance induced mood disorder (HCC) 11/14/2017  . 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 Substance abuse (HCC)   . Schizoaffective disorder, bipolar type (HCC) 04/22/2017  . Cocaine abuse with cocaine-induced mood disorder (HCC) 12/16/2016  . Bipolar disorder, curr episode mixed, severe, with psychotic features (HCC) 10/25/2016  . Cocaine use disorder, mild, abuse (HCC) 10/25/2016  . Cannabis use disorder, severe, dependence (HCC) 10/25/2016  . Insomnia   . Anxiety state   . Cocaine abuse (HCC) 07/04/2015  . Cannabis abuse, continuous 07/02/2015  . Non compliance with medical treatment 06/18/2015  . Bipolar I disorder, current or most recent episode manic, with psychotic features (HCC) 03/02/2015    No past surgical history on file.   OB History    Gravida  1   Para      Term      Preterm      AB      Living        SAB      TAB      Ectopic      Multiple      Live Births               Home Medications    Prior to Admission medications   Medication Sig Start Date End Date Taking? Authorizing Provider  amantadine (SYMMETREL) 100 MG capsule Take 1 capsule (100 mg total) by mouth 2 (two) times daily. For prevention of drug induced tremors 01/17/19 02/16/19  Armandina StammerNwoko, Agnes I, NP  ARIPiprazole ER (ABILIFY MAINTENA) 400 MG SRER injection Inject 2 mLs (400 mg total) into the muscle every 28 (twenty-eight) days. Due 02/17/19: For mood control 01/17/19   Armandina StammerNwoko, Agnes I, NP  carbamazepine (TEGRETOL) 100 MG chewable tablet 1 in am 2 at hs 01/17/19   Malvin JohnsFarah, Brian, MD  fluPHENAZine (PROLIXIN) 10 MG tablet 1 in am 2 at h s 01/17/19   Malvin JohnsFarah, Brian, MD    Family History Family History  Problem Relation Age of Onset  . Mental illness Neg Hx     Social History Social History   Tobacco Use  . Smoking status: Current Some Day Smoker    Packs/day: 0.50    Types: Cigarettes  . Smokeless tobacco: Never Used  Substance Use Topics  . Alcohol use: Not Currently  . Drug use: Yes    Types: Marijuana  Allergies   Penicillins, Cogentin [benztropine], Divalproex sodium, Risperidone, and Valproic acid   Review of Systems Review of Systems  Unable to perform ROS: Psychiatric disorder     Physical Exam Updated Vital Signs BP (!) 93/49 (BP Location: Left Arm)   Pulse 63   Temp 97.9 F (36.6 C) (Oral)   Resp 16   SpO2 98%   Physical Exam Vitals signs and nursing note reviewed.  Constitutional:      Appearance: She is well-developed.     Comments: Appears manic  HENT:     Head: Atraumatic.  Eyes:     Conjunctiva/sclera: Conjunctivae normal.  Neck:     Musculoskeletal: Neck supple.  Cardiovascular:     Rate and Rhythm: Tachycardia present.  Pulmonary:     Effort: Pulmonary effort is normal.     Breath sounds: Normal breath sounds.  Abdominal:     Palpations:  Abdomen is soft.  Musculoskeletal:     Comments: Ecchymosis noted to R medial thigh, minimal tenderness. No deformity  Skin:    Findings: No rash.  Neurological:     Mental Status: She is alert.  Psychiatric:        Mood and Affect: Affect is labile and inappropriate.        Speech: Speech is rapid and pressured.        Behavior: Behavior is hyperactive.        Thought Content: Thought content is delusional.     Comments: Pt with rapid speech, responding to internal stimuli.      ED Treatments / Results  Labs (all labs ordered are listed, but only abnormal results are displayed) Labs Reviewed  COMPREHENSIVE METABOLIC PANEL - Abnormal; Notable for the following components:      Result Value   Potassium 3.2 (*)    CO2 20 (*)    AST 42 (*)    Total Bilirubin 1.3 (*)    All other components within normal limits  CBC WITH DIFFERENTIAL/PLATELET - Abnormal; Notable for the following components:   RBC 3.73 (*)    MCV 102.9 (*)    All other components within normal limits  ETHANOL  RAPID URINE DRUG SCREEN, HOSP PERFORMED  PREGNANCY, URINE    EKG EKG Interpretation  Date/Time:  Sunday April 06 2019 16:32:35 EST Ventricular Rate:  80 PR Interval:    QRS Duration: 100 QT Interval:  394 QTC Calculation: 455 R Axis:   85 Text Interpretation: Sinus rhythm Nonspecific T wave abnormality Baseline wander No significant change since last tracing Confirmed by Cathren Laine ([id number]) on 04/06/2019 4:59:02 PM   Radiology No results found.  Procedures Procedures (including critical care time)  Medications Ordered in ED Medications  sterile water (preservative free) injection (has no administration in time range)  OLANZapine zydis (ZYPREXA) disintegrating tablet 5 mg (has no administration in time range)    And  LORazepam (ATIVAN) tablet 1 mg (has no administration in time range)    And  ziprasidone (GEODON) injection 20 mg (has no administration in time range)  acetaminophen  (TYLENOL) tablet 650 mg (has no administration in time range)  zolpidem (AMBIEN) tablet 5 mg (has no administration in time range)  ondansetron (ZOFRAN) tablet 4 mg (has no administration in time range)  alum & mag hydroxide-simeth (MAALOX/MYLANTA) 200-200-20 MG/5ML suspension 30 mL (has no administration in time range)  nicotine (NICODERM CQ - dosed in mg/24 hours) patch 21 mg (0 mg Transdermal Hold 04/06/19 1711)  carbamazepine (TEGRETOL) chewable tablet 100 mg (  has no administration in time range)  fluPHENAZine (PROLIXIN) tablet 10 mg (has no administration in time range)  potassium chloride SA (KLOR-CON) CR tablet 40 mEq (has no administration in time range)  ziprasidone (GEODON) injection 20 mg (20 mg Intramuscular Given 04/06/19 1614)     Initial Impression / Assessment and Plan / ED Course  I have reviewed the triage vital signs and the nursing notes.  Pertinent labs & imaging results that were available during my care of the patient were reviewed by me and considered in my medical decision making (see chart for details).        BP 132/74 (BP Location: Right Arm)   Pulse 89   Temp 98 F (36.7 C) (Oral)   Resp 20   SpO2 100%    Final Clinical Impressions(s) / ED Diagnoses   Final diagnoses:  Manic behavior ([place])    ED Discharge Orders    None     4:03 PM Pt with hx of bipolar brought here due to AMS, appears to be in a manic state and responding to internal stimuli.  Geodon given as mood stabilizer.   5:38 PM Initially patient was tachycardic with heart rate of in the 150s.  EKG showed sinus tachycardia.  After receiving geodon, patient's symptoms improved, she became much more calm.  Heart rate normalized.  I suspect her behavior changes likely secondary to her underlying psychiatric issues.  Low suspicion for neuroleptic malignant syndrome.  Labs shows mild hypokalemia with a potassium of 3.2, supplementation given.  Patient is currently medically cleared and  stable for further psychiatric assessments of her psychosis and manic state.   Domenic Moras, PA-C 04/06/19 2322    Lajean Saver, MD 04/07/19 1012

## 2019-04-06 NOTE — ED Triage Notes (Signed)
[place] with [employer] manic behavior. Hx of same. Patient yelling in triage but not violent. Patient continuously talks about aliens and make sexual references.

## 2019-04-07 ENCOUNTER — Encounter (HOSPITAL_COMMUNITY): Payer: Self-pay | Admitting: Registered Nurse

## 2019-04-07 NOTE — Discharge Instructions (Signed)
For your behavioral health needs, you are advised to continue treatment with the [facility] ACT Team:       [facility]      [address]      [place], [place] [address]      [phone] 

## 2019-04-07 NOTE — ED Notes (Signed)
Pt discharged safely with instructions to follow up with ACTT.  All belongings were returned to pt.   After patient left we realized she left one dollar, cigarettes, and a shirt in her room.  Part bus pass was offered as she lives in [facility] but patient did not wish to wait.

## 2019-04-07 NOTE — BH Assessment (Signed)
[place] Assessment Progress Note  Per Shuvon Rankin, FNP, this pt does not require psychiatric hospitalization at this time.  Pt presents under IVC initiated by law enforcement, which Hampton Abbot, MD has rescinded.  Pt is to be discharged from [facility] with recommendation to continue treatment with the [facility] ACT Team.  This has been included in pt's discharge instructions.  Pt's nurse, Nena Jordan, has been notified.  Jalene Mullet, [place] Triage Specialist [phone]
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
## 2019-04-07 NOTE — [facility] Suicide Risk Assessment (Cosign Needed)
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 rate 11, height 6\' 1"  (1.854 m), weight 52.2 kg, SpO2 98 %, unknown if currently breastfeeding.Body mass index is 15.18 kg/m.  General Appearance: Casual  Eye Contact::  Good  Speech:  Clear and Coherent and Normal Rate409  Volume:  Normal  Mood:  "Fine"  Appropriate  Affect:  Appropriate and Congruent  Thought Process:  Coherent, Goal Directed and Descriptions of Associations: Intact  Orientation:  Full (Time, Place, and Person)  Thought Content:  WDL  Suicidal Thoughts:  No  Homicidal Thoughts:  No  Memory:  Immediate;   Good Recent;   Good  Judgement:  Intact  Insight:  Present  Psychomotor Activity:  Normal  Concentration:  Good  Recall:  Good  Fund of Knowledge:Good  Language: Good  Akathisia:  No  Handed:  Right  AIMS (if indicated):     Assets:  Communication Skills Desire for Improvement Housing Social Support  Sleep:     Cognition: WNL  ADL's:  Intact   Mental Status Per Nursing Assessment::   On Admission:    Recia [phone], 23 y.o., female patient seen via tele psych by this provider, Dr. 22; and chart reviewed on 04/07/19.  On evaluation Adalene C Conkright reports she is  feeling better this morning states she came to the hospital related to some emotional stress.  "I was crying but I am feeling better and I want to go home.  I am just a little sleepy."  Patient denies suicidal/self-harm/homicidal ideations, psychosis, paranoia.  Patient is also reports that she has a acting.  Dr. 04/09/19 spoke to patient's mother for collateral information she also feels the patient is safe to come home and does not need inpatient psychiatric treatment. During evaluation Laysa C Shipman is alert/oriented x 4; calm/cooperative; and mood is congruent with affect.  She does not appear to be responding to internal/external stimuli or delusional thoughts.  Patient denies suicidal/self-harm/homicidal ideation, psychosis, and paranoia.  Patient answered question appropriately.     Demographic Factors:  Caucasian  Loss Factors: NA  Historical Factors: NA  Risk Reduction Factors:   Sense of responsibility to family, Religious beliefs about death, Living with another person, especially a relative and Positive social support  Continued Clinical Symptoms:  Alcohol/Substance Abuse/Dependencies  Cognitive Features That Contribute To Risk:  None    Suicide Risk:  Minimal: No identifiable suicidal ideation.  Patients presenting with no risk factors but with morbid ruminations; may be classified as minimal risk based on the severity of the depressive symptoms    Plan Of Care/Follow-up recommendations:  Activity:  As tolerated Diet:  Heart healthy     Discharge Instructions     For your behavioral health needs, you are advised to continue treatment with the [facility] ACT Team:  [facility]      [address] [place], [place] [address]      [phone]    Disposition: No evidence of imminent risk to self or others at present.   Patient does not meet criteria for psychiatric inpatient admission. Supportive therapy provided about ongoing  stressors. Discussed crisis plan, support from social network, calling 911, coming to the Emergency Department, and calling Suicide Hotline.   , NP 04/07/2019, 1:00 PM

## 2019-05-23 ENCOUNTER — Encounter (HOSPITAL_COMMUNITY): Payer: Self-pay

## 2019-05-23 ENCOUNTER — Emergency Department (HOSPITAL_COMMUNITY)
Admission: EM | Admit: 2019-05-23 | Discharge: 2019-05-23 | Disposition: A | Discharge status: Home / Self Care | Payer: Self-pay | Attending: Emergency Medicine | Admitting: Emergency Medicine

## 2019-05-23 ENCOUNTER — Encounter (HOSPITAL_COMMUNITY): Payer: Self-pay | Admitting: Emergency Medicine

## 2019-05-23 ENCOUNTER — Other Ambulatory Visit: Payer: Self-pay

## 2019-05-23 DIAGNOSIS — F17210 Nicotine dependence, cigarettes, uncomplicated: Secondary | ICD-10-CM | POA: Insufficient documentation

## 2019-05-23 DIAGNOSIS — J45909 Unspecified asthma, uncomplicated: Secondary | ICD-10-CM | POA: Insufficient documentation

## 2019-05-23 DIAGNOSIS — Z5900 Homelessness unspecified: Secondary | ICD-10-CM

## 2019-05-23 DIAGNOSIS — F410 Panic disorder [episodic paroxysmal anxiety] without agoraphobia: Secondary | ICD-10-CM | POA: Insufficient documentation

## 2019-05-23 DIAGNOSIS — Y9289 Other specified places as the place of occurrence of the external cause: Secondary | ICD-10-CM | POA: Insufficient documentation

## 2019-05-23 DIAGNOSIS — Y9301 Activity, walking, marching and hiking: Secondary | ICD-10-CM | POA: Insufficient documentation

## 2019-05-23 DIAGNOSIS — M79671 Pain in right foot: Secondary | ICD-10-CM | POA: Insufficient documentation

## 2019-05-23 DIAGNOSIS — Y999 Unspecified external cause status: Secondary | ICD-10-CM | POA: Insufficient documentation

## 2019-05-23 DIAGNOSIS — X509XXA Other and unspecified overexertion or strenuous movements or postures, initial encounter: Secondary | ICD-10-CM | POA: Insufficient documentation

## 2019-05-23 DIAGNOSIS — M79672 Pain in left foot: Secondary | ICD-10-CM | POA: Insufficient documentation

## 2019-05-23 MED ORDER — ACETAMINOPHEN 500 MG PO TABS
1000.0000 mg | ORAL_TABLET | Freq: Once | ORAL | Status: AC
  Administered 2019-05-23: 1000 mg via ORAL
  Filled 2019-05-23: qty 2

## 2019-05-23 NOTE — Discharge Instructions (Signed)
If you need somewhere to stay, you can try any of the local shelters.  I have attached a list for you.

## 2019-05-23 NOTE — ED Notes (Signed)
Called pt to bring back to exam room. No response.

## 2019-05-23 NOTE — ED Provider Notes (Signed)
[place] EMERGENCY DEPARTMENT Provider Note   CSN: [id number] Arrival date & time: 05/23/19  1934     History Chief Complaint  Patient presents with  . Panic Attack    Arsenia LUSERO NORDLUND is a 24 y.o. female.  The history is provided by the patient and medical records.   24 y.o. F with hx of asthma, depression, schizoaffective disorder, presenting to the ED for reported anxiety.  This is her 3rd visit in 24 hours, I actually evaluated her [facility] facility.  States she is having "emotional issues".  States she feels cold and her feet hurt from walking.  When inquiring about homelessness, states she has a friend that she can stay with tonight but otherwise does not have anywhere else to go.  She is requesting food/drink.  Past Medical History:  Diagnosis Date  . Asthma   . Cannabis abuse   . Depression   . Methamphetamine abuse ([place])   . Schizo affective schizophrenia [facility])     Patient Active Problem List   Diagnosis Date Noted  . Schizophrenia ([place]) 10/28/2018  . Schizoaffective psychosis ([place]) 05/18/2018  . Cannabis abuse 05/04/2018  . Amphetamine abuse ([place]) 11/14/2017  . Substance induced mood disorder ([place]) 11/14/2017  . 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 Substance abuse ([place])   . Schizoaffective disorder, bipolar type ([place]) 04/22/2017  . Cocaine abuse with cocaine-induced mood disorder ([place]) 12/16/2016  . Bipolar disorder, curr episode mixed, severe, with psychotic features ([place]) 10/25/2016  . Cocaine use disorder, mild, abuse ([place]) 10/25/2016  . Cannabis use disorder, severe, dependence ([place]) 10/25/2016  . Insomnia   . Anxiety state   . Cocaine abuse ([place]) 07/04/2015  . Cannabis abuse, continuous 07/02/2015  . Non compliance with medical treatment 06/18/2015  . Bipolar I disorder, current or most recent episode manic, with psychotic features ([place]) 03/02/2015    History reviewed. No pertinent  surgical history.   OB History    Gravida  1   Para      Term      Preterm      AB      Living        SAB      TAB      Ectopic      Multiple      Live Births              Family History  Problem Relation Age of Onset  . Mental illness Neg Hx     Social History   Tobacco Use  . Smoking status: Current Some Day Smoker    Packs/day: 0.50    Types: Cigarettes  . Smokeless tobacco: Never Used  Substance Use Topics  . Alcohol use: Not Currently  . Drug use: Yes    Types: Marijuana    Home Medications Prior to Admission medications   Medication Sig Start Date End Date Taking? Authorizing Provider  carbamazepine (TEGRETOL) 100 MG chewable tablet 1 in am 2 at hs Patient not taking: Reported on 04/07/2019 01/17/19   Johnn Hai, MD  fluPHENAZine (PROLIXIN) 10 MG tablet 1 in am 2 at h s Patient not taking: Reported on 04/07/2019 01/17/19   Johnn Hai, MD    Allergies    Penicillins, Cogentin [benztropine], Divalproex sodium, Risperidone, and Valproic acid  Review of Systems   Review of Systems  Constitutional:       Homelessness  Skin: Negative for wound.  Neurological: Negative  for numbness.  All other systems reviewed and are negative.   Physical Exam Updated Vital Signs BP (!) 119/54 (BP Location: Left Arm)   Pulse 67   Temp 98.9 F (37.2 C) (Oral)   Resp 16   LMP 05/06/2019   SpO2 99%   Physical Exam Vitals and nursing note reviewed.  Constitutional:      Appearance: She is well-developed.     Comments: Sleeping,wrapped in blankets, NAD  HENT:     Head: Normocephalic and atraumatic.  Eyes:     Conjunctiva/sclera: Conjunctivae normal.     Pupils: Pupils are equal, round, and reactive to light.  Cardiovascular:     Rate and Rhythm: Normal rate and regular rhythm.     Heart sounds: Normal heart sounds.  Pulmonary:     Effort: Pulmonary effort is normal. No respiratory distress.     Breath sounds: Normal breath sounds. No rhonchi.    Abdominal:     General: Bowel sounds are normal.     Palpations: Abdomen is soft.  Musculoskeletal:        General: Normal range of motion.     Cervical back: Normal range of motion.  Skin:    General: Skin is warm and dry.  Neurological:     Mental Status: She is alert and oriented to person, place, and time.  Psychiatric:     Comments: No SI/HI, appears calm and cooperative     ED Results / Procedures / Treatments   Labs (all labs ordered are listed, but only abnormal results are displayed) Labs Reviewed - No data to display  EKG None  Radiology No results found.  Procedures Procedures (including critical care time)  Medications Ordered in ED Medications - No data to display  ED Course  I have reviewed the triage vital signs and the nursing notes.  Pertinent labs & imaging results that were available during my care of the patient were reviewed by me and considered in my medical decision making (see chart for details).    MDM Rules/Calculators/A&P  24 year old female here with reported anxiety.  Concern for visit the past 24 hours for same.  I personally evaluated her last night under similar circumstances at sister facility.  She is calm and cooperative here, no suicidal or homicidal ideation.  She is currently homeless and I suspect that is why she is truly here today as it is cold and raining outside.  I inquired about having somewhere to go, states she has a friend that she can stay with tonight.  I have given her resource guide for local shelters.  She may return here for any new/acute changes.  Final Clinical Impression(s) / ED Diagnoses Final diagnoses:  Homelessness    Rx / DC Orders ED Discharge Orders    None       Garlon Hatchet, PA-C 05/23/19 2258    Milagros Loll, MD 05/26/19 1326

## 2019-05-23 NOTE — ED Notes (Signed)
Patient verbalizes understanding of discharge instructions. Opportunity for questioning and answers were provided. Armband removed by staff, pt discharged from ED.  

## 2019-05-23 NOTE — ED Notes (Signed)
Pt changed into a gown and has call bell within reach.

## 2019-05-23 NOTE — ED Notes (Signed)
Pt found looking through the cabinets in room for blankets upon arriving from EMS. Pt given warm blankets.

## 2019-05-23 NOTE — ED Notes (Signed)
Informed PA pt wants to be evaluated for a panic attack. Pt given food and discharge papers. Pt does not appear willing to leave. Security notified to assist pt with leaving.

## 2019-05-23 NOTE — ED Triage Notes (Signed)
Pt BIB [facility] from a gas station. Pt states she attempted to walk here from [facility] when she "started hyperventilating and having a panic attack." Pt states she also has bilateral foot pain from walking. Pt denies COVID contact, HI or SI.

## 2019-05-23 NOTE — ED Provider Notes (Signed)
[facility] EMERGENCY DEPARTMENT Provider Note   CSN: [id number] Arrival date & time: 05/23/19  5053     History Chief Complaint  Patient presents with  . Foot Pain    Amy [name] is a 24 y.o. female.  HPI   24 year old female with history of asthma, cannabis use, depression, methamphetamine abuse, schizoaffective schizophrenia, who presents the emergency department today complaining of bilateral foot pain.  She was seen in the ED yesterday for same.  States foot pain started after walking to [place] from [employer].  She denies HI, SI.  Denies new injuries after being seen yesterday.  Past Medical History:  Diagnosis Date  . Asthma   . Cannabis abuse   . Depression   . Methamphetamine abuse ([place])   . Schizo affective schizophrenia [facility])     Patient Active Problem List   Diagnosis Date Noted  . Schizophrenia ([place]) 10/28/2018  . Schizoaffective psychosis ([place]) 05/18/2018  . Cannabis abuse 05/04/2018  . 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 Substance induced mood disorder ([place]) 11/14/2017  . Cannabis abuse with psychotic disorder with delusions ([place]) 10/28/2017  . Substance abuse ([place])   . Schizoaffective disorder, bipolar type ([place]) 04/22/2017  . Cocaine abuse with cocaine-induced mood disorder ([place]) 12/16/2016  . Bipolar disorder, curr episode mixed, severe, with psychotic features ([place]) 10/25/2016  . Cocaine use disorder, mild, abuse ([place]) 10/25/2016  . Cannabis use disorder, severe, dependence ([place]) 10/25/2016  . Insomnia   . Anxiety state   . Cocaine abuse ([place]) 07/04/2015  . Cannabis abuse, continuous 07/02/2015  . Non compliance with medical treatment 06/18/2015  . Bipolar I disorder, current or most recent episode manic, with psychotic features ([place]) 03/02/2015    History reviewed. No pertinent surgical history.   OB History    Gravida  1   Para      Term      Preterm      AB      Living        SAB      TAB      Ectopic      Multiple      Live Births              Family History  Problem Relation Age of Onset  . Mental illness Neg Hx     Social History   Tobacco Use  . Smoking status: Current Some Day Smoker    Packs/day: 0.50    Types: Cigarettes  . Smokeless tobacco: Never Used  Substance Use Topics  . Alcohol use: Not Currently  . Drug use: Yes    Types: Marijuana    Home Medications Prior to Admission medications   Medication Sig Start Date End Date Taking? Authorizing Provider  carbamazepine (TEGRETOL) 100 MG chewable tablet 1 in am 2 at hs Patient not taking: Reported on 04/07/2019 01/17/19   Johnn Hai, MD  fluPHENAZine (PROLIXIN) 10 MG tablet 1 in am 2 at h s Patient not taking: Reported on 04/07/2019 01/17/19   Johnn Hai, MD    Allergies    Penicillins, Cogentin [benztropine], Divalproex sodium, Risperidone, and Valproic acid  Review of Systems   Review of Systems  Constitutional: Negative for fever.  Musculoskeletal:       Foot pain  Skin: Negative for wound.  Psychiatric/Behavioral:       No si/hi    Physical Exam Updated Vital Signs BP 116/68 (BP Location: Left Arm)   Pulse 71   Temp Marland Kitchen)  97.5 F (36.4 C) (Oral)   Resp 18   LMP 05/06/2019   SpO2 99%   Physical Exam Vitals and nursing note reviewed.  Constitutional:      General: She is not in acute distress.    Appearance: She is well-developed.     Comments: Patient sleeping comfortably, easily arousable  HENT:     Head: Normocephalic and atraumatic.  Eyes:     Conjunctiva/sclera: Conjunctivae normal.  Cardiovascular:     Rate and Rhythm: Normal rate.  Pulmonary:     Effort: Pulmonary effort is normal.  Musculoskeletal:        General: Normal range of motion.     Cervical back: Neck supple.     Comments: No TTP to the bilateral feet.  No open wounds or skin changes.  Pulses intact.  Brisk cap refill distally.  No obvious step-off or deformity to the feet.    Skin:    General: Skin  is warm and dry.  Neurological:     Mental Status: She is alert.     Comments: Clear speech, answers questions appropriately  Psychiatric:     Comments: Denies SI, HI     ED Results / Procedures / Treatments   Labs (all labs ordered are listed, but only abnormal results are displayed) Labs Reviewed - No data to display  EKG None  Radiology No results found.  Procedures Procedures (including critical care time)  Medications Ordered in ED Medications - No data to display  ED Course  I have reviewed the triage vital signs and the nursing notes.  Pertinent labs & imaging results that were available during my care of the patient were reviewed by me and considered in my medical decision making (see chart for details).    MDM Rules/Calculators/A&P                      24 year old female presenting for evaluation of bilateral foot pain after walking to [facility] from [facility].  On exam no open wounds or obvious injuries to the feet.  No significant tenderness on exam.  Do not feel advanced imaging is necessary at this time as I have low suspicion for fracture or other abnormality.  She does not appear to have any evidence of acute psychiatric decompensation.  She can follow-up with PCP.  Advised on return precautions.  Patient discharged in stable condition.  Final Clinical Impression(s) / ED Diagnoses Final diagnoses:  Foot pain, bilateral    Rx / DC Orders ED Discharge Orders    None       Karrie Meres, PA-C 05/23/19 5465    Gwyneth Sprout, MD 05/23/19 [phone]

## 2019-05-23 NOTE — ED Triage Notes (Signed)
Patient reports panic attack today , calm at triage, respirations unlabored , denies suicidal ideation or hallucinations.

## 2019-05-23 NOTE — ED Notes (Signed)
Pt requesting peanut butter sandwich and drink. Pt informed that we needed to wait for a provider to see her before we could give her anything. Pt agreed, was provided with a blanket and call light.   Pt sitting in bed with even and unlabored respirations, no distress observed.

## 2019-05-23 NOTE — ED Notes (Signed)
Pt was seen in the covid positive area laying down. Asked the patient to get up and sit elsewhere as that area is for positive patients only. Patient starts yelling saying it will be bad for the staff if we keep bothering her and the sort rn told her to sit there. Victorino Dike RN came over to ask pt for her safety to sit elsewhere. Pt gets up with threatening remarks.

## 2019-05-23 NOTE — ED Notes (Signed)
Patient given sandwhich, cheese, and sprite.

## 2019-05-23 NOTE — ED Provider Notes (Signed)
[facility] DEPT Provider Note   CSN: [id number] Arrival date & time: 05/23/19  0203     History Chief Complaint  Patient presents with  . Panic Attack    Amy [name] is a 24 y.o. female.  The history is provided by the patient and medical records.    24 y.o. F with hx of asthma, depression, schizo affective disorder, presenting to the ED following a panic attack.  Patient is homeless, was walking here from [facility] [place] and called EMS due to panic attack.  States she is feeling better now but is cold, hungry, and her feet hurt from walking.  She denies SI/HI.  No other complaints.  Past Medical History:  Diagnosis Date  . Asthma   . Cannabis abuse   . Depression   . Methamphetamine abuse ([place])   . Schizo affective schizophrenia [facility])     Patient Active Problem List   Diagnosis Date Noted  . Schizophrenia ([place]) 10/28/2018  . Schizoaffective psychosis ([place]) 05/18/2018  . Cannabis abuse 05/04/2018  . 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 Substance induced mood disorder ([place]) 11/14/2017  . Cannabis abuse with psychotic disorder with delusions ([place]) 10/28/2017  . Substance abuse ([place])   . Schizoaffective disorder, bipolar type ([place]) 04/22/2017  . Cocaine abuse with cocaine-induced mood disorder ([place]) 12/16/2016  . Bipolar disorder, curr episode mixed, severe, with psychotic features ([place]) 10/25/2016  . Cocaine use disorder, mild, abuse ([place]) 10/25/2016  . Cannabis use disorder, severe, dependence ([place]) 10/25/2016  . Insomnia   . Anxiety state   . Cocaine abuse ([place]) 07/04/2015  . Cannabis abuse, continuous 07/02/2015  . Non compliance with medical treatment 06/18/2015  . Bipolar I disorder, current or most recent episode manic, with psychotic features ([place]) 03/02/2015    History reviewed. No pertinent surgical history.   OB History    Gravida  1   Para      Term      Preterm      AB      Living        SAB      TAB     Ectopic      Multiple      Live Births              Family History  Problem Relation Age of Onset  . Mental illness Neg Hx     Social History   Tobacco Use  . Smoking status: Current Some Day Smoker    Packs/day: 0.50    Types: Cigarettes  . Smokeless tobacco: Never Used  Substance Use Topics  . Alcohol use: Not Currently  . Drug use: Yes    Types: Marijuana    Home Medications Prior to Admission medications   Medication Sig Start Date End Date Taking? Authorizing Provider  carbamazepine (TEGRETOL) 100 MG chewable tablet 1 in am 2 at hs Patient not taking: Reported on 04/07/2019 01/17/19   Johnn Hai, MD  fluPHENAZine (PROLIXIN) 10 MG tablet 1 in am 2 at h s Patient not taking: Reported on 04/07/2019 01/17/19   Johnn Hai, MD    Allergies    Penicillins, Cogentin [benztropine], Divalproex sodium, Risperidone, and Valproic acid  Review of Systems   Review of Systems  Musculoskeletal: Positive for arthralgias.  All other systems reviewed and are negative.   Physical Exam Updated Vital Signs BP 118/66   Pulse 90   Temp 99.2 F (37.3 C) (Oral)   Resp 17  Ht 6\' 1"  (1.854 m)   Wt 77.1 kg   LMP 05/06/2019   SpO2 98%   BMI 22.43 kg/m   Physical Exam Vitals and nursing note reviewed.  Constitutional:      Appearance: She is well-developed.     Comments: Lying on stretcher in several blankets, NAD  HENT:     Head: Normocephalic and atraumatic.  Eyes:     Conjunctiva/sclera: Conjunctivae normal.     Pupils: Pupils are equal, round, and reactive to light.  Cardiovascular:     Rate and Rhythm: Normal rate and regular rhythm.     Heart sounds: Normal heart sounds.  Pulmonary:     Effort: Pulmonary effort is normal.     Breath sounds: Normal breath sounds.  Abdominal:     General: Bowel sounds are normal.     Palpations: Abdomen is soft.  Musculoskeletal:        General: Normal range of motion.     Cervical back: Normal range of motion.   Skin:    General: Skin is warm and dry.  Neurological:     Mental Status: She is alert and oriented to person, place, and time.  Psychiatric:     Comments: Does not appear overly anxious, denies SI/HI     ED Results / Procedures / Treatments   Labs (all labs ordered are listed, but only abnormal results are displayed) Labs Reviewed - No data to display  EKG None  Radiology No results found.  Procedures Procedures (including critical care time)  Medications Ordered in ED Medications  acetaminophen (TYLENOL) tablet 1,000 mg (has no administration in time range)    ED Course  I have reviewed the triage vital signs and the nursing notes.  Pertinent labs & imaging results that were available during my care of the patient were reviewed by me and considered in my medical decision making (see chart for details).    MDM Rules/Calculators/A&P  24 year old female here with reported anxiety attack.  She is homeless and was walking here from [facility] when called EMS.  States she is feeling better by time of arrival but has pain in both of her feet from walking.  She is afebrile nontoxic.  Does not appear overly anxious.  She is able to express herself with clear and concise thoughts.  She does not appear manic or psychotic.  She is not displaying any suicidal homicidal ideation and does not appear to be a danger to self or others at this time.  Do not feel she requires any further work-up at this time.  She was given Tylenol for pain as well as food and blankets.  Can follow-up with her ACT team for ongoing management of her anxiety.  Return here for any new/acute changes.  Final Clinical Impression(s) / ED Diagnoses Final diagnoses:  Foot pain, bilateral    Rx / DC Orders ED Discharge Orders    None       [facility], PA-C 05/23/19 0310    07/21/19, MD 05/23/19 [phone]

## 2019-05-23 NOTE — ED Triage Notes (Signed)
Per EMS, pt was at [place] earlier tonight, left, went to a gas station and called for EMS to bring her here to [facility].  C/O bilateral foot pain, "I walked from [employer] to [facility]," no apparent wounds, swelling or injuries. A/O X4 120/70 HR 80 99%RA 97.3temp

## 2019-05-23 NOTE — Discharge Instructions (Addendum)
Can continue tylenol or motrin for pain of the feet. Follow-up with your ACT team for management of anxiety. Return here for any new/acute changes.

## 2019-05-23 NOTE — Discharge Instructions (Signed)
Please follow up with your primary care provider within 5-7 days for re-evaluation of your symptoms. If you do not have a primary care provider, information for a healthcare clinic has been provided for you to make arrangements for follow up care. Please return to the emergency department for any new or worsening symptoms. ° °

## 2019-05-24 ENCOUNTER — Other Ambulatory Visit: Payer: Self-pay

## 2019-05-24 ENCOUNTER — Observation Stay (HOSPITAL_COMMUNITY)
Admission: RE | Admit: 2019-05-24 | Discharge: 2019-05-24 | Disposition: A | Discharge status: Home / Self Care | Payer: Federal, State, Local not specified - Other | Attending: Psychiatry | Admitting: Psychiatry

## 2019-05-24 ENCOUNTER — Encounter (HOSPITAL_COMMUNITY): Payer: Self-pay | Admitting: Nurse Practitioner

## 2019-05-24 DIAGNOSIS — J45909 Unspecified asthma, uncomplicated: Secondary | ICD-10-CM | POA: Insufficient documentation

## 2019-05-24 DIAGNOSIS — R45851 Suicidal ideations: Secondary | ICD-10-CM | POA: Insufficient documentation

## 2019-05-24 DIAGNOSIS — F410 Panic disorder [episodic paroxysmal anxiety] without agoraphobia: Secondary | ICD-10-CM | POA: Insufficient documentation

## 2019-05-24 DIAGNOSIS — Z9114 Patient's other noncompliance with medication regimen: Secondary | ICD-10-CM | POA: Insufficient documentation

## 2019-05-24 DIAGNOSIS — F250 Schizoaffective disorder, bipolar type: Principal | ICD-10-CM

## 2019-05-24 DIAGNOSIS — Z79899 Other long term (current) drug therapy: Secondary | ICD-10-CM | POA: Insufficient documentation

## 2019-05-24 DIAGNOSIS — Z590 Homelessness: Secondary | ICD-10-CM | POA: Insufficient documentation

## 2019-05-24 DIAGNOSIS — F17210 Nicotine dependence, cigarettes, uncomplicated: Secondary | ICD-10-CM | POA: Insufficient documentation

## 2019-05-24 DIAGNOSIS — F1911 Other psychoactive substance abuse, in remission: Secondary | ICD-10-CM | POA: Insufficient documentation

## 2019-05-24 DIAGNOSIS — G4700 Insomnia, unspecified: Secondary | ICD-10-CM | POA: Insufficient documentation

## 2019-05-24 DIAGNOSIS — Z888 Allergy status to other drugs, medicaments and biological substances status: Secondary | ICD-10-CM | POA: Insufficient documentation

## 2019-05-24 DIAGNOSIS — Z20822 Contact with and (suspected) exposure to covid-19: Secondary | ICD-10-CM | POA: Insufficient documentation

## 2019-05-24 DIAGNOSIS — Z880 Allergy status to penicillin: Secondary | ICD-10-CM | POA: Insufficient documentation

## 2019-05-24 HISTORY — DX: Anxiety disorder, unspecified: F41.9

## 2019-05-24 HISTORY — DX: Bipolar disorder, unspecified: F31.9

## 2019-05-24 LAB — RESPIRATORY PANEL BY RT PCR (FLU A&B, COVID)
Influenza A by PCR: NEGATIVE
Influenza B by PCR: NEGATIVE
SARS Coronavirus 2 by RT PCR: NEGATIVE

## 2019-05-24 MED ORDER — FLUPHENAZINE HCL 5 MG PO TABS: 20.0000 mg | ORAL_TABLET | Freq: Every day | ORAL | Status: DC

## 2019-05-24 MED ORDER — OLANZAPINE 5 MG PO TBDP
5.0000 mg | ORAL_TABLET | Freq: Once | ORAL | Status: AC
  Administered 2019-05-24: 03:00:00 5 mg via ORAL
  Filled 2019-05-24: qty 1

## 2019-05-24 MED ORDER — FLUPHENAZINE HCL 5 MG PO TABS: 10.0000 mg | ORAL_TABLET | Freq: Every day | ORAL | Status: DC

## 2019-05-24 MED ORDER — FLUPHENAZINE HCL 10 MG PO TABS: 20.0000 mg | ORAL_TABLET | Freq: Every day | ORAL | 0 refills | Status: DC

## 2019-05-24 MED ORDER — ALUM & MAG HYDROXIDE-SIMETH 200-200-20 MG/5ML PO SUSP: 30.0000 mL | ORAL | Status: DC | PRN

## 2019-05-24 MED ORDER — FLUPHENAZINE HCL 10 MG PO TABS: 10.0000 mg | ORAL_TABLET | Freq: Every day | ORAL | 0 refills | Status: DC

## 2019-05-24 MED ORDER — ACETAMINOPHEN 325 MG PO TABS: 650.0000 mg | ORAL_TABLET | Freq: Four times a day (QID) | ORAL | Status: DC | PRN

## 2019-05-24 MED ORDER — ARIPIPRAZOLE 5 MG PO TABS: 5.0000 mg | ORAL_TABLET | Freq: Every day | ORAL | 0 refills | Status: DC

## 2019-05-24 MED ORDER — MAGNESIUM HYDROXIDE 400 MG/5ML PO SUSP: 30.0000 mL | Freq: Every day | ORAL | Status: DC | PRN

## 2019-05-24 MED ORDER — HYDROXYZINE HCL 25 MG PO TABS: 25.0000 mg | ORAL_TABLET | Freq: Three times a day (TID) | ORAL | Status: DC | PRN

## 2019-05-24 MED ORDER — CARBAMAZEPINE 100 MG PO CHEW: 100.0000 mg | CHEWABLE_TABLET | Freq: Two times a day (BID) | ORAL | 0 refills | Status: DC

## 2019-05-24 MED ORDER — AMANTADINE HCL 100 MG PO CAPS
100.0000 mg | ORAL_CAPSULE | Freq: Two times a day (BID) | ORAL | Status: DC
  Filled 2019-05-24 (×3): qty 1

## 2019-05-24 MED ORDER — CARBAMAZEPINE 100 MG PO CHEW: 100.0000 mg | CHEWABLE_TABLET | Freq: Two times a day (BID) | ORAL | Status: DC

## 2019-05-24 MED ORDER — ARIPIPRAZOLE 5 MG PO TABS: 5.0000 mg | ORAL_TABLET | Freq: Every day | ORAL | Status: DC

## 2019-05-24 MED ORDER — HYDROXYZINE HCL 25 MG PO TABS: 25.0000 mg | ORAL_TABLET | Freq: Three times a day (TID) | ORAL | 0 refills | Status: DC | PRN

## 2019-05-24 MED ORDER — AMANTADINE HCL 100 MG PO CAPS: 100.0000 mg | ORAL_CAPSULE | Freq: Two times a day (BID) | ORAL | 0 refills | Status: DC

## 2019-05-24 MED ORDER — LORAZEPAM 1 MG PO TABS
1.0000 mg | ORAL_TABLET | Freq: Once | ORAL | Status: AC
  Administered 2019-05-24: 03:00:00 1 mg via ORAL
  Filled 2019-05-24: qty 1

## 2019-05-24 NOTE — Discharge Summary (Addendum)
Physician Discharge Summary Note  Patient:  Amy [name] is an 24 y.o., female MRN:  [id number] DOB:  November 16, 1995 Patient phone:  [phone] (home)  Patient address:   [address] [place] [address],  Total Time spent with patient: 30 minutes  Date of Admission:  05/24/2019 Date of Discharge: 05/24/2019   Reason for Admission: Patient admitted complaints of "panic attacks."  Principal Problem: Schizoaffective disorder, bipolar type [facility]) Discharge Diagnoses: Principal Problem:   Schizoaffective disorder, bipolar type ([place])   Past Psychiatric History: Schizoaffective disorder, bipolar disorder, cocaine use disorder, cannabis use disorder, amphetamine use disorder, schizoaffective psychosis  Past Medical History:  Past Medical History:  Diagnosis Date   Anxiety    Asthma    Bipolar disorder ([place])    Cannabis abuse    Depression    Methamphetamine abuse ([place])    Schizo affective schizophrenia ([place])    History reviewed. No pertinent surgical history. Family History:  Family History  Problem Relation Age of Onset   Mental illness Neg Hx    Family Psychiatric  History: Denies Social History:  Social History   Substance and Sexual Activity  Alcohol Use Not Currently     Social History   Substance and Sexual Activity  Drug Use Yes   Types: Marijuana    Social History   Socioeconomic History   Marital status: Single    Spouse name: Not on file   Number of children: Not on file   Years of education: 12   Highest education level: 12th grade  Occupational History   Occupation: Unemployed  Tobacco Use   Smoking status: Current Some Day Smoker    Packs/day: 0.50    Types: Cigarettes   Smokeless tobacco: Never Used  Substance and Sexual Activity   Alcohol use: Not Currently   Drug use: Yes    Types: Marijuana   Sexual activity: Not Currently    Birth control/protection: None  Other Topics Concern   Not on file  Social History Narrative   Pt is  unemployed.  She stated that she lives in [place].  Due to altered mental status, difficult to obtain history.  Per previous history, Pt lives with her parents.   Social Determinants of Health   Financial Resource Strain:    Difficulty of Paying Living Expenses: Not on file  Food Insecurity:    Worried About Charity fundraiser in the Last Year: Not on file   [employer] of Food in the Last Year: Not on file  Transportation Needs:    Lack of Transportation (Medical): Not on file   Lack of Transportation (Non-Medical): Not on file  Physical Activity:    Days of Exercise per Week: Not on file   Minutes of Exercise per Session: Not on file  Stress:    Feeling of Stress : Not on file  Social Connections:    Frequency of Communication with Friends and Family: Not on file   Frequency of Social Gatherings with Friends and Family: Not on file   Attends Religious Services: Not on file   Active Member of Clubs or Organizations: Not on file   Attends Archivist Meetings: Not on file   Marital Status: Not on file    Hospital Course: Patient admitted after 3 ED visits in 24 hours.  Patient states that she has been experiencing "panic attacks."  Patient requests "I need to be somewhere for couple of days so that I can get my thoughts cleared out."  Patient denies compliance with home medications at this time.  Medications including amantadine 100 twice daily, Abilify 5 mg daily, Tegretol 100 mg twice daily, Prolixin 10 mg daily, Prolixin 20 mg daily at bedtime.  Patient currently denies suicidal and homicidal ideations.  Patient denies hallucinations.  Patient reports she "feels better today."  Patient currently resides in [facility], patient instructed to follow-up with [facility] [employer]. Patient assessed along with Dr Jannifer Franklin. Discharge recommended.  Physical Findings: AIMS: Facial and Oral Movements Muscles of Facial Expression: None, normal Lips and Perioral Area: None, normal Jaw:  None, normal Tongue: None, normal,Extremity Movements Upper (arms, wrists, hands, fingers): None, normal Lower (legs, knees, ankles, toes): None, normal, Trunk Movements Neck, shoulders, hips: None, normal, Overall Severity Severity of abnormal movements (highest score from questions above): None, normal Incapacitation due to abnormal movements: None, normal Patient's awareness of abnormal movements (rate only patient's report): No Awareness, Dental Status Current problems with teeth and/or dentures?: No Does patient usually wear dentures?: No  CIWA:  CIWA-Ar Total: 0 COWS:  COWS Total Score: 2  Musculoskeletal: Strength & Muscle Tone: within normal limits Gait & Station: normal Patient leans: [name]  Psychiatric Specialty Exam: Physical Exam  Nursing note and vitals reviewed. Constitutional: She is oriented to person, place, and time. She appears well-developed.  HENT:  Head: Normocephalic.  Cardiovascular: Normal rate.  Respiratory: Effort normal.  Neurological: She is alert and oriented to person, place, and time.  Psychiatric: She has a normal mood and affect. Her behavior is normal. Judgment and thought content normal.    Review of Systems  Constitutional: Negative.   HENT: Negative.   Eyes: Negative.   Respiratory: Negative.   Cardiovascular: Negative.   Gastrointestinal: Negative.   Genitourinary: Negative.   Musculoskeletal: Negative.   Skin: Negative.   Neurological: Negative.     Blood pressure 109/63, pulse 83, temperature 98.9 F (37.2 C), resp. rate 16, height 6\' 1"  (1.854 m), weight 76.7 kg, last menstrual period 05/06/2019, SpO2 100 %, unknown if currently breastfeeding.Body mass index is 22.3 kg/m.  General Appearance: Casual  Eye Contact:  Fair  Speech:  Clear and Coherent  Volume:  Normal  Mood:  Euthymic  Affect:  Appropriate and Congruent  Thought Process:  Coherent, Goal Directed and Descriptions of Associations: Intact  Orientation:  Full (Time,  Place, and Person)  Thought Content:  WDL and Logical  Suicidal Thoughts:  No  Homicidal Thoughts:  No  Memory:  Immediate;   Good Recent;   Good Remote;   Good  Judgement:  Good  Insight:  Fair  Psychomotor Activity:  Normal  Concentration:  Concentration: Good and Attention Span: Good  Recall:  Good  Fund of Knowledge:  Good  Language:  Good  Akathisia:  No  Handed:  Right  AIMS (if indicated):     Assets:  Communication Skills Desire for Improvement Financial Resources/Insurance Housing Social Support  ADL's:  Intact  Cognition:  WNL  Sleep:           Has this patient used any form of tobacco in the last 30 days? (Cigarettes, Smokeless Tobacco, Cigars, and/or Pipes) Yes, Yes, A prescription for an FDA-approved tobacco cessation medication was offered at discharge and the patient refused  Blood Alcohol level:  Lab Results  Component Value Date   [facility] <10 04/06/2019   ETH <10 01/13/2019    Metabolic Disorder Labs:  Lab Results  Component Value Date   HGBA1C 4.7 (L) 10/29/2018   MPG 88.19  10/29/2018   MPG 96.8 06/20/2018   Lab Results  Component Value Date   PROLACTIN 33.3 (H) 10/25/2016   PROLACTIN 66.9 (H) 12/03/2015   Lab Results  Component Value Date   CHOL 112 10/29/2018   TRIG 59 10/29/2018   HDL 45 10/29/2018   CHOLHDL 2.5 10/29/2018   VLDL 12 10/29/2018   LDLCALC 55 10/29/2018   LDLCALC 65 06/20/2018    See Psychiatric Specialty Exam and Suicide Risk Assessment completed by Attending Physician prior to discharge.  Discharge destination:  Home  Is patient on multiple antipsychotic therapies at discharge:  No   Has Patient had three or more failed trials of antipsychotic monotherapy by history:  No  Recommended Plan for Multiple Antipsychotic Therapies: NA    Allergies as of 05/24/2019       Reactions   Penicillins Rash   Has patient had a PCN reaction causing immediate rash, facial/tongue/throat swelling, SOB or lightheadedness with  hypotension: Yes Has patient had a PCN reaction causing severe rash involving mucus membranes or skin necrosis: No Has patient had a PCN reaction that required hospitalization: No Has patient had a PCN reaction occurring within the last 10 years: Yes If all of the above answers are "NO", then may    Cogentin [benztropine] Itching   Divalproex Sodium Other (See Comments)   Creates feelings of paranoia, some suicidal feelings, and makes her feel that "people are coming after" her Creates feelings of paranoia, some suicidal feelings, and makes her feel that "people are coming after" her   Risperidone Other (See Comments)   "Makes me cough"   Valproic Acid Other (See Comments)   Creates paranoia/Per [facility] Creates paranoia/Per [facility]        Medication List     TAKE these medications      Indication  amantadine 100 MG capsule Commonly known as: SYMMETREL Take 1 capsule (100 mg total) by mouth 2 (two) times daily.  Indication: Extrapyramidal Reaction caused by Medications   ARIPiprazole 5 MG tablet Commonly known as: ABILIFY Take 1 tablet (5 mg total) by mouth daily. Start taking on: May 25, 2019  Indication: MIXED BIPOLAR AFFECTIVE DISORDER   carbamazepine 100 MG chewable tablet Commonly known as: TEGRETOL Chew 1 tablet (100 mg total) by mouth 2 (two) times daily.  Indication: Manic-Depression   fluPHENAZine 10 MG tablet Commonly known as: PROLIXIN Take 2 tablets (20 mg total) by mouth at bedtime.  Indication: Psychosis   fluPHENAZine 10 MG tablet Commonly known as: PROLIXIN Take 1 tablet (10 mg total) by mouth daily. Start taking on: May 25, 2019  Indication: Psychosis   hydrOXYzine 25 MG tablet Commonly known as: ATARAX/VISTARIL Take 1 tablet (25 mg total) by mouth 3 (three) times daily as needed for anxiety.  Indication: Feeling Anxious          Follow-up recommendations:  Other:  Follow up with [facility]  Comments:   Discharge  Signed: [name], FNP 05/24/2019, 12:52 PM Patient seen face-to-face for psychiatric evaluation, chart reviewed and case discussed with the physician extender and developed treatment plan. Reviewed the information documented and agree with the treatment plan. Thedore Mins, MD
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
## 2019-05-24 NOTE — Progress Notes (Signed)
Amy [name] NOVEL CORONAVIRUS (COVID-19) DAILY CHECK-OFF SYMPTOMS - answer yes or no to each - every day NO YES  Have you had a fever in the past 24 hours?  . Fever (Temp > 37.80C / 100F) X   Have you had any of these symptoms in the past 24 hours? . New Cough .  Sore Throat  .  Shortness of Breath .  Difficulty Breathing .  Unexplained Body Aches   X   Have you had any one of these symptoms in the past 24 hours not related to allergies?   . Runny Nose .  Nasal Congestion .  Sneezing   X   If you have had runny nose, nasal congestion, sneezing in the past 24 hours, has it worsened?  X   EXPOSURES - check yes or no X   Have you traveled outside the state in the past 14 days?  X   Have you been in contact with someone with a confirmed diagnosis of COVID-19 or PUI in the past 14 days without wearing appropriate PPE?  X   Have you been living in the same home as a person with confirmed diagnosis of COVID-19 or a PUI (household contact)?    X   Have you been diagnosed with COVID-19?    X              What to do next: Answered NO to all: Answered YES to anything:   Proceed with unit schedule Follow the [facility] Inpatient Flowsheet.   

## 2019-05-24 NOTE — Progress Notes (Signed)
Discharge:  Pt discharged at 3:45pm to private resident with a friend. Pt given new medication orders and  indicated understanding of orders. Pt called friend to arrange pickup at another location than [facility]. Pt stated she would take public transportation to meet friend. All belongings returned and signed for. No signs of distress observed.      Einar Crow. Melvyn Neth MSN, RN, [facility] [phone]

## 2019-05-24 NOTE — Progress Notes (Signed)
Patient ID: Amy [name], female   DOB: 1996-04-29, 24 y.o.   MRN: [id number] Pt A&O x 4, presents with SI, no plan denies HI, seeing visions of glow balls and mechanical things she reports.  Pt reports she has been experiencing panic attacks and needs a couple of days to get her thoughts cleared out.  Skin search completed, monitoring for safety.

## 2019-05-24 NOTE — Progress Notes (Signed)
Patient is black female of 23 years, alert and oriented X 4.     Patient presents in a calm mood on assessment and states that she slept "good" last night  denies using medication to sleep.  Appetite reported as "poor" and energy level reported "low". Patient endorsed depression, anxiety. She reports  pain at 5 of 10 in lower back. Patient denies SI, HI, and AVH today.     Vital signs monitored.  Support and encouragement provided. Patient remains safe at this time. Routine safety checks conducted every 15 minutes. Patient agreed to notify staff with problems or concerns.    Patient compliant with medications and treatment plan. Patient receptive, calm, and cooperative. Patient not interacting with others on the unit.     Einar Crow. Melvyn Neth MSN, RN, [facility] [phone]
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
## 2019-05-24 NOTE — [facility] Suicide Risk Assessment (Signed)
Suicide Risk Assessment  Discharge Assessment   [facility] Discharge Suicide Risk Assessment   Principal Problem: Schizoaffective disorder, bipolar type [facility]) Discharge Diagnoses: Principal Problem:   Schizoaffective disorder, bipolar type (HCC)   Total Time spent with patient: 30 minutes  Musculoskeletal: Strength & Muscle Tone: within normal limits Gait & Station: normal Patient leans: [name]  Psychiatric Specialty Exam:   Blood pressure 109/63, pulse 83, temperature 98.9 F (37.2 C), resp. rate 16, height 6\' 1"  (1.854 m), weight 76.7 kg, last menstrual period 05/06/2019, SpO2 100 %, unknown if currently breastfeeding.Body mass index is 22.3 kg/m.  General Appearance: Casual  Eye Contact::  Good  Speech:  Clear and Coherent and Normal Rate409  Volume:  Normal  Mood:  Euthymic  Affect:  Appropriate and Congruent  Thought Process:  Coherent, Goal Directed and Descriptions of Associations: Intact  Orientation:  Full (Time, Place, and Person)  Thought Content:  WDL and Logical  Suicidal Thoughts:  No  Homicidal Thoughts:  No  Memory:  Immediate;   Good Recent;   Good Remote;   Good  Judgement:  Fair  Insight:  Fair  Psychomotor Activity:  Normal  Concentration:  Good  Recall:  Good  Fund of Knowledge:Good  Language: Good  Akathisia:  No  Handed:  Right  AIMS (if indicated):     Assets:  Communication Skills Desire for Improvement Financial Resources/Insurance Housing Social Support  Sleep:     Cognition: WNL  ADL's:  Intact   Mental Status Per Nursing Assessment::   On Admission:  Self-harm thoughts  Demographic Factors:  Caucasian  Loss Factors: NA  Historical Factors: NA  Risk Reduction Factors:   Living with another person, especially a relative, Positive social support, Positive therapeutic relationship and Positive coping skills or problem solving skills  Continued Clinical Symptoms:  Previous Psychiatric Diagnoses and Treatments  Cognitive Features  That Contribute To Risk:  None    Suicide Risk:  Minimal: No identifiable suicidal ideation.  Patients presenting with no risk factors but with morbid ruminations; may be classified as minimal risk based on the severity of the depressive symptoms    Plan Of Care/Follow-up recommendations:  Other:  Follow up with outpatient psychiatry, [facility]  [phone], FNP 05/24/2019, 12:48 PM

## 2019-05-24 NOTE — Plan of Care (Signed)
B[facility] Observation Crisis Plan  Reason for Crisis Plan:  Crisis Stabilization   Plan of Care:  Referral for Inpatient Hospitalization  Family Support:      Current Living Environment:  Living Arrangements: Other (Comment)(Pt says she has been staying with biological father)  Insurance:   Hospital Account    Name Acct ID Class Status Primary Coverage   Kyra, [place] [id number] BEHAVIORAL HEALTH OBSERVATION Open None        Guarantor Account (for Hospital Account [id number])    Name Relation to Pt Service Area Active? Acct Type   Otho Najjar Self [facility] Yes Behavioral Health   Address Phone       [address] [place], [place] [address] [phone](H)          Coverage Information (for Hospital Account [id number])    Not on file      Legal Guardian:  Legal Guardian: Other:(Self)  Primary Care Provider:  Patient, No Pcp Per  Current Outpatient Providers:  none  Psychiatrist:  Name of Psychiatrist: None  Counselor/Therapist:  Name of Therapist: None  Compliant with Medications:  No  Additional Information:   Tasia Catchings 1/2/20213:53 AM

## 2019-05-27 ENCOUNTER — Other Ambulatory Visit: Payer: Self-pay

## 2019-05-27 ENCOUNTER — Emergency Department (HOSPITAL_COMMUNITY): Payer: Self-pay

## 2019-05-27 ENCOUNTER — Encounter (HOSPITAL_COMMUNITY): Payer: Self-pay | Admitting: Emergency Medicine

## 2019-05-27 ENCOUNTER — Emergency Department (HOSPITAL_COMMUNITY)
Admission: EM | Admit: 2019-05-27 | Discharge: 2019-05-27 | Disposition: A | Discharge status: Home / Self Care | Payer: Self-pay | Attending: Emergency Medicine | Admitting: Emergency Medicine

## 2019-05-27 DIAGNOSIS — F17210 Nicotine dependence, cigarettes, uncomplicated: Secondary | ICD-10-CM | POA: Insufficient documentation

## 2019-05-27 DIAGNOSIS — Y9289 Other specified places as the place of occurrence of the external cause: Secondary | ICD-10-CM | POA: Insufficient documentation

## 2019-05-27 DIAGNOSIS — Z79899 Other long term (current) drug therapy: Secondary | ICD-10-CM | POA: Insufficient documentation

## 2019-05-27 DIAGNOSIS — Z590 Homelessness: Secondary | ICD-10-CM | POA: Insufficient documentation

## 2019-05-27 DIAGNOSIS — M25562 Pain in left knee: Secondary | ICD-10-CM | POA: Insufficient documentation

## 2019-05-27 DIAGNOSIS — J45909 Unspecified asthma, uncomplicated: Secondary | ICD-10-CM | POA: Insufficient documentation

## 2019-05-27 DIAGNOSIS — Y9389 Activity, other specified: Secondary | ICD-10-CM | POA: Insufficient documentation

## 2019-05-27 DIAGNOSIS — F1210 Cannabis abuse, uncomplicated: Secondary | ICD-10-CM | POA: Insufficient documentation

## 2019-05-27 DIAGNOSIS — Y999 Unspecified external cause status: Secondary | ICD-10-CM | POA: Insufficient documentation

## 2019-05-27 DIAGNOSIS — S0990XA Unspecified injury of head, initial encounter: Secondary | ICD-10-CM | POA: Insufficient documentation
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
## 2019-05-27 NOTE — ED Notes (Signed)
An After Visit Summary was printed and given to the patient. Discharge instructions given and no further questions at this time. Pt has knee sleeve on.

## 2019-05-27 NOTE — ED Triage Notes (Addendum)
Pt never left after discharge this morning, now states she has left knee pain without injury, ambulates without difficulty. States she needs a brace on it. Pt did not want to leave earlier, went to lobby slept a few min then checked back in. Pt was rude in triage, yanking the bp cuff off arm when nurse was trying to obtain VS

## 2019-05-27 NOTE — ED Notes (Signed)
Pt was seen rude to screeners this am after stating that she released from the hospital.
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
## 2019-05-27 NOTE — Discharge Instructions (Addendum)
Use ice packs over the tender area.  You can safely take acetaminophen 650 mg 4 times a day for headache if needed.  Recheck if you have any problems on the head injury sheet.
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
## 2019-05-29 ENCOUNTER — Encounter (HOSPITAL_COMMUNITY): Payer: Self-pay

## 2019-05-29 ENCOUNTER — Emergency Department (HOSPITAL_COMMUNITY)
Admission: EM | Admit: 2019-05-29 | Discharge: 2019-05-30 | Disposition: A | Discharge status: Home / Self Care | Payer: Self-pay | Attending: Emergency Medicine | Admitting: Emergency Medicine

## 2019-05-29 ENCOUNTER — Other Ambulatory Visit: Payer: Self-pay

## 2019-05-29 DIAGNOSIS — F1910 Other psychoactive substance abuse, uncomplicated: Secondary | ICD-10-CM | POA: Diagnosis present

## 2019-05-29 DIAGNOSIS — Z79899 Other long term (current) drug therapy: Secondary | ICD-10-CM | POA: Insufficient documentation

## 2019-05-29 DIAGNOSIS — R45851 Suicidal ideations: Secondary | ICD-10-CM | POA: Insufficient documentation

## 2019-05-29 DIAGNOSIS — J45909 Unspecified asthma, uncomplicated: Secondary | ICD-10-CM | POA: Insufficient documentation

## 2019-05-29 DIAGNOSIS — F309 Manic episode, unspecified: Secondary | ICD-10-CM

## 2019-05-29 DIAGNOSIS — F17210 Nicotine dependence, cigarettes, uncomplicated: Secondary | ICD-10-CM | POA: Insufficient documentation

## 2019-05-29 DIAGNOSIS — F1510 Other stimulant abuse, uncomplicated: Secondary | ICD-10-CM | POA: Diagnosis present

## 2019-05-29 DIAGNOSIS — F250 Schizoaffective disorder, bipolar type: Secondary | ICD-10-CM | POA: Insufficient documentation

## 2019-05-29 DIAGNOSIS — Z20822 Contact with and (suspected) exposure to covid-19: Secondary | ICD-10-CM | POA: Insufficient documentation

## 2019-05-29 LAB — CBC
HCT: 40.3 % (ref 36.0–46.0)
Hemoglobin: 12.9 g/dL (ref 12.0–15.0)
MCH: 33.1 pg (ref 26.0–34.0)
MCHC: 32 g/dL (ref 30.0–36.0)
MCV: 103.3 fL — ABNORMAL HIGH (ref 80.0–100.0)
Platelets: 372 10*3/uL (ref 150–400)
RBC: 3.9 MIL/uL (ref 3.87–5.11)
RDW: 12.4 % (ref 11.5–15.5)
WBC: 7.1 10*3/uL (ref 4.0–10.5)
nRBC: 0 % (ref 0.0–0.2)

## 2019-05-29 LAB — COMPREHENSIVE METABOLIC PANEL
ALT: 10 U/L (ref 0–44)
AST: 15 U/L (ref 15–41)
Albumin: 3.7 g/dL (ref 3.5–5.0)
Alkaline Phosphatase: 47 U/L (ref 38–126)
Anion gap: 8 (ref 5–15)
BUN: 11 mg/dL (ref 6–20)
CO2: 28 mmol/L (ref 22–32)
Calcium: 8.9 mg/dL (ref 8.9–10.3)
Chloride: 105 mmol/L (ref 98–111)
Creatinine, Ser: 0.76 mg/dL (ref 0.44–1.00)
GFR calc Af Amer: >60 mL/min (ref >60)
GFR calc non Af Amer: >60 mL/min (ref >60)
Glucose, Bld: 97 mg/dL (ref 70–99)
Potassium: 3.9 mmol/L (ref 3.5–5.1)
Sodium: 141 mmol/L (ref 135–145)
Total Bilirubin: 0.3 mg/dL (ref 0.3–1.2)
Total Protein: 7.3 g/dL (ref 6.5–8.1)

## 2019-05-29 LAB — SALICYLATE LEVEL: Salicylate Lvl: <7 mg/dL — ABNORMAL LOW (ref 7.0–30.0)

## 2019-05-29 LAB — I-STAT BETA HCG BLOOD, ED (MC, WL, AP ONLY): I-stat hCG, quantitative: <5 m[IU]/mL (ref <5)

## 2019-05-29 LAB — ACETAMINOPHEN LEVEL: Acetaminophen (Tylenol), Serum: <10 ug/mL — ABNORMAL LOW (ref 10–30)

## 2019-05-29 LAB — RESPIRATORY PANEL BY RT PCR (FLU A&B, COVID)
Influenza A by PCR: NEGATIVE
Influenza B by PCR: NEGATIVE
SARS Coronavirus 2 by RT PCR: NEGATIVE

## 2019-05-29 LAB — ETHANOL: Alcohol, Ethyl (B): <10 mg/dL (ref <10)
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
## 2019-05-29 NOTE — ED Triage Notes (Signed)
Pt reports chronic SI, but denies HI. No plan. She states that she has no where to stay right now and needs to be stabilized on her medication. A&Ox4 and cooperative at this time. Pt is voluntary.

## 2019-05-30 LAB — RAPID URINE DRUG SCREEN, HOSP PERFORMED
Amphetamines: POSITIVE — AB
Barbiturates: NOT DETECTED
Benzodiazepines: NOT DETECTED
Cocaine: NOT DETECTED
Opiates: NOT DETECTED
Tetrahydrocannabinol: POSITIVE — AB

## 2019-05-30 MED ORDER — ARIPIPRAZOLE 5 MG PO TABS: 5.0000 mg | ORAL_TABLET | Freq: Every day | ORAL | Status: DC

## 2019-05-30 MED ORDER — FLUPHENAZINE HCL 5 MG PO TABS
10.0000 mg | ORAL_TABLET | Freq: Every day | ORAL | Status: DC
  Filled 2019-05-30: qty 2

## 2019-05-30 MED ORDER — AMANTADINE HCL 100 MG PO CAPS: 100.0000 mg | ORAL_CAPSULE | Freq: Two times a day (BID) | ORAL | Status: DC

## 2019-05-30 MED ORDER — HYDROXYZINE HCL 25 MG PO TABS: 25.0000 mg | ORAL_TABLET | Freq: Three times a day (TID) | ORAL | Status: DC | PRN

## 2019-05-30 NOTE — Discharge Instructions (Signed)
For your behavioral health needs you are advised to follow up with [facility].  New patients are seen at their walk-in clinic.  Walk-in hours are Monday - Friday from 8:30 am - 12:00 pm, and from 1:00 pm - 2:30 pm.  Walk-in patients are seen on a first come, first served basis, so try to arrive as early as possible for the best chance of being seen the same day:       [facility]      [address]      [address], [place] [address]      [phone] 

## 2019-05-30 NOTE — [facility] Suicide Risk Assessment (Cosign Needed Addendum)
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 rate 18, last menstrual period 05/06/2019, SpO2 98 %, unknown if currently breastfeeding.There is no height or weight on file to calculate BMI.  General Appearance: Casual and Fairly Groomed  Eye Contact::  Good  Speech:  Clear and Coherent and Normal Rate409  Volume:  Normal  Mood:  Euthymic  Affect:  Appropriate and Congruent  Thought Process:  Coherent, Goal Directed and Descriptions of Associations: Intact  Orientation:  Full (Time, Place, and Person)  Thought Content:  WDL and Logical  Suicidal Thoughts:  No  Homicidal Thoughts:  No  Memory:  Immediate;   Good Recent;   Good Remote;   Good  Judgement:  Fair  Insight:  Fair  Psychomotor Activity:  Normal  Concentration:  Good  Recall:  Good  Fund of Knowledge:Good  Language: Good  Akathisia:  No  Handed:  Right  AIMS (if indicated):     Assets:  Communication Skills Desire for Improvement Financial Resources/Insurance Housing Intimacy Physical Health Social Support Talents/Skills Transportation  Sleep:     Cognition: WNL  ADL's:  Intact   Mental Status Per Nursing Assessment::   On Admission:   Patient presents to [facility] emergency department with all third mental status related to substance use disorder.  Patient complains of methamphetamine and cannabis use.  Peers support consult ordered.  Patient states "I do not really use like that." Patient continues to deny suicidal and homicidal  ideations.  Patient denies auditory and visual hallucinations.  Patient denies symptoms of paranoia at this time. Patient reports plan to discharge to stay with her aunt Amy [name] in [place]. Patient seen along with Dr. Lucianne Muss. Recommend discharge after peer support consult.  Recommend follow-up with outpatient psychiatry. Patient gives verbal consent to speak with Amy [name] [phone] Demographic Factors:  NA  Loss Factors: NA  Historical Factors: NA  Risk Reduction Factors:   Sense of responsibility to family, Living with another person, especially a relative, Positive social support, Positive therapeutic relationship and Positive coping skills or problem solving skills  Continued Clinical Symptoms:  Alcohol/Substance Abuse/Dependencies  Cognitive Features That Contribute To Risk:  None    Suicide Risk:  Minimal: No identifiable suicidal ideation.  Patients presenting with no risk factors but with morbid ruminations; may be classified as minimal risk based on the severity of the depressive symptoms    Plan Of Care/Follow-up recommendations:  Other:  Follow-up with outpatient psychiatry provider.  [name], FNP 05/30/2019, 10:46 AM

## 2019-05-30 NOTE — BH Assessment (Signed)
Assessment Note  Amy [name] is an 24 y.o. female that presents this date with altered mental status associated with ongoing SA issues. Patient has a history of schizoaffective disorder bipolar type, methamphetamine use, and cannabis use who presents to the ED with complaints of multiple stressors to include: homelessness and financial issues. Patient denies any S/I, H/I or AVH. Per notes on 05/29/19, patient initially presented with altered mental status reporting she had fears for her life and suspected that she would be abducted for human trafficking if not killed with a baseball bat. She reports that she was walking down [facility] and believes that everybody was out to get her. She endorses a history of mania, but states that this is worse. She was recently discharged from [facility] 05/24/2019 and was instructed to start taking Abilify, but she states that she has not been taking it because it "makes her dumb". When asked if patient drinks alcohol, she states only bubbly beer. She also admits to using ice, which is a methamphetamine. When asked about any audiovisual hallucinations, she reports that there is a scary woman with a "clear face" who changes appearance who she saw this week, but she did not today.    This date patient is oriented x 4 and presents with pleasant affect. Patient denies any S/I, H/I or AVH. Patient renders a somewhat conflicting history reporting that she resides with her partner (boyfriend) who is "a famous rapper." Patient later states she is homeless. Patient denies any recent SA use beyond having "a couple beers" prior to arrival yesterday. Patient's UDS was noted to be positive for amphetamines and cannabis. Patient denies any current mental health symptoms or diagnosis. Patient was last seen on 05/24/19 when she presented having a panic attack. Patient's medical record indicates a history of bizarre behavior and delusional thought process. Medical records indicates a  history of chronic homelessness, medication noncompliance, and frequent visits to locals ED with psychiatric symptoms. Patient denies any current outpatient mental health providers this date.     Patient is dressed in scrubs, alert and oriented x4. Patient speaks in a clear tone, at moderate volume and normal pace. Motor behavior appears normal. Eye contact is good. Patient's mood is anxious and affect is congruent with mood. Thought process is coherent at times and at other times disorganized. Patient denies any thoughts of self harm and is requesting to be discharged. Case was staffed with Hall Busing NP who recommended patient be seen by peer support and discharged.      Diagnosis: F25.0 Schizoaffective disorder, Bipolar type (per history)  Past Medical History:  Past Medical History:  Diagnosis Date  . Anxiety   . Asthma   . Bipolar disorder ([place])   . Cannabis abuse   . Depression   . Methamphetamine abuse ([place])   . Schizo affective schizophrenia ([place])     History reviewed. No pertinent surgical history.  Family History:  Family History  Problem Relation Age of Onset  . Mental illness Neg Hx     Social History:  reports that she has been smoking cigarettes. She has been smoking about 0.50 packs per day. She has never used smokeless tobacco. She reports previous alcohol use. She reports current drug use. Drug: Marijuana.  Additional Social History:  Alcohol / Drug Use Pain Medications: See MAR Prescriptions: See MAR Over the Counter: See MAR Longest period of sobriety (when/how long): Unknown Negative Consequences of Use: (Denies) Withdrawal Symptoms: (Denies) Substance #1 Name of Substance 1:  Alcohol per hx 1 - Age of First Use: UTA 1 - Amount (size/oz): UTA 1 - Frequency: UTA 1 - Duration: UTA 1 - Last Use / Amount: UTA  CIWA: CIWA-Ar BP: (!) 100/55 Pulse Rate: 68 Nausea and Vomiting: no nausea and no vomiting Tactile Disturbances: none Tremor: no tremor Auditory  Disturbances: not present Paroxysmal Sweats: no sweat visible Visual Disturbances: not present Anxiety: mildly anxious Headache, Fullness in Head: none present Agitation: normal activity Orientation and Clouding of Sensorium: oriented and can do serial additions CIWA-Ar Total: 1 COWS:    Allergies:  Allergies  Allergen Reactions  . Penicillins Rash    Has patient had a PCN reaction causing immediate rash, facial/tongue/throat swelling, SOB or lightheadedness with hypotension: Yes Has patient had a PCN reaction causing severe rash involving mucus membranes or skin necrosis: No Has patient had a PCN reaction that required hospitalization: No Has patient had a PCN reaction occurring within the last 10 years: Yes If all of the above answers are "NO", then may   . Cogentin [Benztropine] Itching  . Divalproex Sodium Other (See Comments)    Creates feelings of paranoia, some suicidal feelings, and makes her feel that "people are coming after" her Creates feelings of paranoia, some suicidal feelings, and makes her feel that "people are coming after" her  . Risperidone Other (See Comments)    "Makes me cough"  . Valproic Acid Other (See Comments)    Creates paranoia/Per [facility] Creates paranoia/Per [facility]    Home Medications: (Not in a hospital admission)   OB/GYN Status:  Patient's last menstrual period was 05/06/2019 (lmp unknown).  General Assessment Data [facility] of [facility]: [facility] TTS Assessment: In system Is this a Tele or Face-to-Face Assessment?: Face-to-Face Is this an Initial Assessment or a Re-assessment for this encounter?: Initial Assessment Patient Accompanied by:: [name] Language Other than English: No Living Arrangements: Other (Comment)(Pt states homeless) What gender do you identify as?: Female Marital status: Single Maiden name: NA Pregnancy Status: Other (Comment)(States two months pregnant and later states she is not) Living Arrangements:  Other (Comment)(Conflicting hx) Can pt return to current living arrangement?: Yes Admission Status: Voluntary Is patient capable of signing voluntary admission?: Yes Referral Source: Self/Family/Friend Insurance type: SP  Medical Screening Exam [facility] Walk-in ONLY) Medical Exam completed: Yes  Crisis Care Plan Living Arrangements: Other (Comment)(Conflicting hx) Legal Guardian: (NA) Name of Psychiatrist: None Name of Therapist: None  Education Status Is patient currently in school?: No Is the patient employed, unemployed or receiving disability?: Unemployed  Risk to self with the past 6 months Suicidal Ideation: No Has patient been a risk to self within the past 6 months prior to admission? : No Suicidal Intent: No Has patient had any suicidal intent within the past 6 months prior to admission? : No Is patient at risk for suicide?: No, but patient needs Medical Clearance Suicidal Plan?: No Has patient had any suicidal plan within the past 6 months prior to admission? : No Access to Means: No What has been your use of drugs/alcohol within the last 12 months?: Pt denies hx of alcohol use per chart Previous Attempts/Gestures: No How many times?: 1(Per notes) Other Self Harm Risks: (NA) Triggers for Past Attempts: Unknown Intentional Self Injurious Behavior: None Family Suicide History: No Recent stressful life event(s): Other (Comment)(Homeless, Financial) Persecutory voices/beliefs?: No Depression: No Depression Symptoms: (Denies) Substance abuse history and/or treatment for substance abuse?: Yes Suicide prevention information given to non-admitted patients: Not applicable  Risk to Others within the past 6 months Homicidal Ideation: No Does patient have any lifetime risk of violence toward others beyond the six months prior to admission? : No Thoughts of Harm to Others: No Current Homicidal Intent: No Current Homicidal Plan: No Access to Homicidal Means: No Identified  Victim: NA History of harm to others?: No Assessment of Violence: None Noted Violent Behavior Description: NA Does patient have access to weapons?: No Criminal Charges Pending?: No Describe Pending Criminal Charges: NA Does patient have a court date: No Is patient on probation?: No  Psychosis Hallucinations: None noted Delusions: None noted  Mental Status Report Appearance/Hygiene: In scrubs Eye Contact: Fair Motor Activity: Freedom of movement Speech: Logical/coherent Level of Consciousness: Alert Mood: Pleasant Affect: Appropriate to circumstance Anxiety Level: Minimal Panic attack frequency: (NA) Most recent panic attack: (NA) Thought Processes: Coherent, Relevant Judgement: Partial Orientation: Person, Place, Time Obsessive Compulsive Thoughts/Behaviors: None  Cognitive Functioning Concentration: Normal Memory: Recent Intact, Remote Intact Is patient IDD: No Insight: Fair Impulse Control: Fair Appetite: Good Have you had any weight changes? : No Change Sleep: No Change Total Hours of Sleep: 7 Vegetative Symptoms: None  ADLScreening [facility] Assessment Services) Patient's cognitive ability adequate to safely complete daily activities?: Yes Patient able to express need for assistance with ADLs?: Yes Independently performs ADLs?: Yes (appropriate for developmental age)  Prior Inpatient Therapy Prior Inpatient Therapy: Yes Prior Therapy Dates: (Multiple admits) Prior Therapy Facilty/Provider(s): [facility] Reason for Treatment: MH issues  Prior Outpatient Therapy Prior Outpatient Therapy: Yes Prior Therapy Dates: Unk Prior Therapy Facilty/Provider(s): [facility] Reason for Treatment: Med mang Does patient have an ACCT team?: No Does patient have Intensive In-House Services?  : No Does patient have [facility] services? : Yes Does patient have P4CC services?: No  ADL Screening (condition at time of admission) Patient's cognitive ability adequate to safely complete daily  activities?: Yes Is the patient deaf or have difficulty hearing?: No Does the patient have difficulty seeing, even when wearing glasses/contacts?: No Does the patient have difficulty concentrating, remembering, or making decisions?: No Patient able to express need for assistance with ADLs?: Yes Does the patient have difficulty dressing or bathing?: No Independently performs ADLs?: Yes (appropriate for developmental age) Does the patient have difficulty walking or climbing stairs?: No Weakness of Legs: None Weakness of Arms/Hands: None  Home Assistive Devices/Equipment Home Assistive Devices/Equipment: None  Therapy Consults (therapy consults require a physician order) PT Evaluation Needed: No OT Evalulation Needed: No SLP Evaluation Needed: No Abuse/Neglect Assessment (Assessment to be complete while patient is alone) Physical Abuse: Denies Verbal Abuse: Denies Sexual Abuse: Denies Exploitation of patient/patient's resources: Denies Self-Neglect: Denies Values / Beliefs Cultural Requests During Hospitalization: None Spiritual Requests During Hospitalization: None Consults Spiritual Care Consult Needed: No Transition of Care Team Consult Needed: No Advance Directives (For Healthcare) Does Patient Have a Medical Advance Directive?: No Would patient like information on creating a medical advance directive?: No - Patient declined          Disposition: Case was staffed with Arlana Pouch NP who recommended patient be seen by peer support and discharged.     Disposition Initial Assessment Completed for this Encounter: Yes Disposition of Patient: Discharge  On Site Evaluation by:   Reviewed with Physician:    Alfredia Ferguson 05/30/2019 10:17 AM

## 2019-05-30 NOTE — Patient Outreach (Signed)
[facility] met with the patient in order to provide substance use recovery support and provide information for substance use recovery resources. Patient denied any current or past history of active substance use addiction. UDS was positive for amphetamines and cannabis. CPSS still provided patient with information for several different substance use recovery resources. Some of these resources include residential/outpatient substance use treatment center list, [place] online/in-person NA meeting list, [place] vacancy list/flier detailing [employer], Arts development officer for [place] dual diagnosis treatment services, and [facility] contact information. [facility]PSS encouraged the patient to contact [facility] if needed for further help with [facility] substance use recovery outreach services.

## 2019-05-30 NOTE — BH Assessment (Addendum)
[facility] Assessment Progress Note  Per Tina Tate, FNP, this pt does not require psychiatric hospitalization at this time.  [facility] be [facility] from [facility] with recommendation to follow up with [facility].  This has been included in pt's discharge instructions.  Pt would also benefit from seeing Peer Support Specialists, and a peer support consult has been ordered for pt.  Pt's nurse has been notified.  Vianna Venezia, MA Triage Specialist [phone]     

## 2019-05-30 NOTE — Progress Notes (Signed)
Received Amy [name] from the main ED at 2330 hrs. She changed into scrubs and  was oriented to her new environment. She received a snack of her choice and eventually drifted off to sleep. She slept well throughout the night.

## 2019-06-08 ENCOUNTER — Other Ambulatory Visit: Payer: Self-pay

## 2019-06-08 ENCOUNTER — Encounter (HOSPITAL_BASED_OUTPATIENT_CLINIC_OR_DEPARTMENT_OTHER): Payer: Self-pay | Admitting: Emergency Medicine

## 2019-06-08 ENCOUNTER — Emergency Department (HOSPITAL_BASED_OUTPATIENT_CLINIC_OR_DEPARTMENT_OTHER)
Admission: EM | Admit: 2019-06-08 | Discharge: 2019-06-08 | Disposition: A | Discharge status: Home / Self Care | Payer: Self-pay | Attending: Emergency Medicine | Admitting: Emergency Medicine

## 2019-06-08 DIAGNOSIS — Z79899 Other long term (current) drug therapy: Secondary | ICD-10-CM | POA: Insufficient documentation

## 2019-06-08 DIAGNOSIS — F1210 Cannabis abuse, uncomplicated: Secondary | ICD-10-CM | POA: Insufficient documentation

## 2019-06-08 DIAGNOSIS — M25562 Pain in left knee: Secondary | ICD-10-CM | POA: Insufficient documentation

## 2019-06-08 DIAGNOSIS — F17210 Nicotine dependence, cigarettes, uncomplicated: Secondary | ICD-10-CM | POA: Insufficient documentation

## 2019-06-08 NOTE — ED Provider Notes (Signed)
[place] EMERGENCY DEPARTMENT Provider Note   CSN: [id number] Arrival date & time: 06/08/19  0448     History Chief Complaint  Patient presents with  . knee pain, psych complaint    Amy [name] is a 24 y.o. female.  24 yo F with a chief complaint of left knee pain.  Patient states that she fell down.  She is unable to quantify much further.  Also states that she needs to be evaluated for her mental health problems.  She is not sure why we have started her on different medications for her mental health issues.  She would like to talk to someone about it.  The history is provided by the patient and the EMS personnel.  Illness Severity:  Moderate Onset quality:  Gradual Duration:  1 day Timing:  Constant Progression:  Unchanged Chronicity:  New Associated symptoms: no chest pain, no congestion, no fever, no headaches, no myalgias, no nausea, no rhinorrhea, no shortness of breath, no vomiting and no wheezing        Past Medical History:  Diagnosis Date  . Anxiety   . Asthma   . Bipolar disorder ([place])   . Cannabis abuse   . Depression   . Methamphetamine abuse ([place])   . Schizo affective schizophrenia [facility])     Patient Active Problem List   Diagnosis Date Noted  . Schizophrenia ([place]) 10/28/2018  . Schizoaffective psychosis ([place]) 05/18/2018  . Cannabis abuse 05/04/2018  . 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 Substance induced mood disorder ([place]) 11/14/2017  . 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 Substance abuse ([place])   . Schizoaffective disorder, bipolar type ([place]) 04/22/2017  . Cocaine abuse with cocaine-induced mood disorder ([place]) 12/16/2016  . Bipolar disorder, curr episode mixed, severe, with psychotic features ([place]) 10/25/2016  . Cocaine use disorder, mild, abuse ([place]) 10/25/2016  . Cannabis use disorder, severe, dependence ([place]) 10/25/2016  . Insomnia   . Anxiety state   . Cocaine abuse ([place]) 07/04/2015    . Cannabis abuse, continuous 07/02/2015  . Non compliance with medical treatment 06/18/2015  . Bipolar I disorder, current or most recent episode manic, with psychotic features ([place]) 03/02/2015    History reviewed. No pertinent surgical history.   OB History    Gravida  1   Para      Term      Preterm      AB      Living        SAB      TAB      Ectopic      Multiple      Live Births              Family History  Problem Relation Age of Onset  . Mental illness Neg Hx     Social History   Tobacco Use  . Smoking status: Current Some Day Smoker    Packs/day: 0.50    Types: Cigarettes  . Smokeless tobacco: Never Used  Substance Use Topics  . Alcohol use: Not Currently  . Drug use: Yes    Types: Marijuana    Home Medications Prior to Admission medications   Medication Sig Start Date End Date Taking? Authorizing Provider  amantadine (SYMMETREL) 100 MG capsule Take 1 capsule (100 mg total) by mouth 2 (two) times daily. 05/24/19   Emmaline Kluver, FNP  ARIPiprazole (ABILIFY) 5 MG tablet Take 1 tablet (5 mg total) by mouth daily. 05/25/19  [name], FNP  carbamazepine (TEGRETOL) 100 MG chewable tablet Chew 1 tablet (100 mg total) by mouth 2 (two) times daily. 05/24/19   [name], FNP  fluPHENAZine (PROLIXIN) 10 MG tablet Take 1 tablet (10 mg total) by mouth daily. 05/25/19   [name], FNP  fluPHENAZine (PROLIXIN) 10 MG tablet Take 2 tablets (20 mg total) by mouth at bedtime. 05/24/19   [name], FNP  hydrOXYzine (ATARAX/VISTARIL) 25 MG tablet Take 1 tablet (25 mg total) by mouth 3 (three) times daily as needed for anxiety. 05/24/19   [name], FNP    Allergies    Penicillins, Cogentin [benztropine], Divalproex sodium, Risperidone, and Valproic acid  Review of Systems   Review of Systems  Constitutional: Negative for chills and fever.  HENT: Negative for congestion and rhinorrhea.   Eyes: Negative for redness and visual disturbance.  Respiratory:  Negative for shortness of breath and wheezing.   Cardiovascular: Negative for chest pain and palpitations.  Gastrointestinal: Negative for nausea and vomiting.  Genitourinary: Negative for dysuria and urgency.  Musculoskeletal: Positive for arthralgias. Negative for myalgias.  Skin: Negative for pallor and wound.  Neurological: Negative for dizziness and headaches.    Physical Exam Updated Vital Signs BP (!) 103/55   Pulse 72   Temp 97.6 F (36.4 C) (Oral)   Resp 20   Ht 6\' 1"  (1.854 m)   Wt 72.6 kg   LMP 04/14/2019   SpO2 99%   BMI 21.11 kg/m   Physical Exam Vitals and nursing note reviewed.  Constitutional:      General: She is not in acute distress.    Appearance: She is well-developed. She is not diaphoretic.  HENT:     Head: Normocephalic and atraumatic.  Eyes:     Pupils: Pupils are equal, round, and reactive to light.  Cardiovascular:     Rate and Rhythm: Normal rate and regular rhythm.     Heart sounds: No murmur. No friction rub. No gallop.   Pulmonary:     Effort: Pulmonary effort is normal.     Breath sounds: No wheezing or rales.  Abdominal:     General: There is no distension.     Palpations: Abdomen is soft.     Tenderness: There is no abdominal tenderness.  Musculoskeletal:        General: No tenderness.     Cervical back: Normal range of motion and neck supple.     Comments: Complaining of pain to the left knee.  I mail the range the knee without any significant tenderness.  There are no signs of trauma.  Skin:    General: Skin is warm and dry.  Neurological:     Mental Status: She is alert and oriented to person, place, and time.  Psychiatric:        Behavior: Behavior normal.     ED Results / Procedures / Treatments   Labs (all labs ordered are listed, but only abnormal results are displayed) Labs Reviewed - No data to display  EKG None  Radiology No results found.  Procedures Procedures (including critical care time)  Medications  Ordered in ED Medications - No data to display  ED Course  I have reviewed the triage vital signs and the nursing notes.  Pertinent labs & imaging results that were available during my care of the patient were reviewed by me and considered in my medical decision making (see chart for details).    MDM Rules/Calculators/A&P  24 yo F with a chief complaint of left knee pain.  States that she had a fall from standing.  There is no edema no erythema no signs of trauma.  Full range of motion without pain.  The patient then will only discussed with me that she needs to have a psychiatric evaluation.  I discussed with her the limitations of a psychiatric evaluation for someone who wishes to discuss her medicines and I told her I did not feel that this was warranted at this visit.  The patient then began to just repeat herself over and over again saying that she needed a mental health evaluation.  I feel the patient is looking for this for secondary gain.  No suicidal or homicidal ideation.  Able to carry on a conversation when she wants to, corrects me on the time of day.   I will discharge the patient at this time.  Have her follow-up as an outpatient which I feel is more appropriate.  5:17 AM:  I have discussed the diagnosis/risks/treatment options with the patient and believe the pt to be eligible for discharge home to follow-up with PCP, psychiatry. We also discussed returning to the ED immediately if new or worsening sx occur. We discussed the sx which are most concerning (e.g., sudden worsening pain, fever, inability to tolerate by mouth) that necessitate immediate return. Medications administered to the patient during their visit and any new prescriptions provided to the patient are listed below.  Medications given during this visit Medications - No data to display   The patient appears reasonably screen and/or stabilized for discharge and I doubt any other medical condition or  other [facility] requiring further screening, evaluation, or treatment in the ED at this time prior to discharge.   Final Clinical Impression(s) / ED Diagnoses Final diagnoses:  Acute pain of left knee    Rx / DC Orders ED Discharge Orders    None       Melene Plan, DO 06/08/19 [phone]

## 2019-06-08 NOTE — ED Triage Notes (Signed)
Pt presents with [facility]. Pt states she has knee pain and that it is "moving out of place". When asked what brought her to the ED she first stated arm pain. Pt also mentions wanting to "talk to someone" about her psych history and why she is on certain medications "because I don't understand". Pt states she is not taking her meds currently and wants to be evaluated.

## 2019-06-08 NOTE — ED Notes (Signed)
Pt states she has knee pain since 0430, denies injury when asked by RN, tells EDP she fell. Pt states she can't walk on it and that it is out of place. Pt has walked out of her room to ask for warm blanket prior to triage, and several other times to ask for blankets, meals, and evaluation. Pt asked by 2 different RNs if she had an SI, pt denied to both. Pt is well known to ED. For safety [facility] asked to be on scene for discharge. Pt reluctant to leave ED, departure facilitated by [facility]. Pt ambulatory at departure.

## 2019-06-08 NOTE — ED Notes (Signed)
ED Provider at bedside. 

## 2019-06-08 NOTE — Discharge Instructions (Addendum)
Take 4 over the counter ibuprofen tablets 3 times a day or 2 over-the-counter naproxen tablets twice a day for pain. Also take tylenol 1000mg (2 extra strength) four times a day.   Follow up with your psychiatrist and discuss your concern with your medications with them.

## 2019-06-19 ENCOUNTER — Emergency Department (HOSPITAL_COMMUNITY)
Admission: EM | Admit: 2019-06-19 | Discharge: 2019-06-19 | Disposition: A | Discharge status: Home / Self Care | Payer: Self-pay | Attending: Emergency Medicine | Admitting: Emergency Medicine

## 2019-06-19 ENCOUNTER — Other Ambulatory Visit: Payer: Self-pay

## 2019-06-19 DIAGNOSIS — Z79899 Other long term (current) drug therapy: Secondary | ICD-10-CM | POA: Insufficient documentation

## 2019-06-19 DIAGNOSIS — Z20822 Contact with and (suspected) exposure to covid-19: Secondary | ICD-10-CM | POA: Insufficient documentation

## 2019-06-19 DIAGNOSIS — F1994 Other psychoactive substance use, unspecified with psychoactive substance-induced mood disorder: Secondary | ICD-10-CM | POA: Diagnosis present

## 2019-06-19 DIAGNOSIS — Z720 Tobacco use: Secondary | ICD-10-CM | POA: Insufficient documentation

## 2019-06-19 DIAGNOSIS — F250 Schizoaffective disorder, bipolar type: Secondary | ICD-10-CM | POA: Insufficient documentation

## 2019-06-19 DIAGNOSIS — R4689 Other symptoms and signs involving appearance and behavior: Secondary | ICD-10-CM | POA: Insufficient documentation

## 2019-06-19 DIAGNOSIS — R45850 Homicidal ideations: Secondary | ICD-10-CM | POA: Insufficient documentation

## 2019-06-19 DIAGNOSIS — F1510 Other stimulant abuse, uncomplicated: Secondary | ICD-10-CM | POA: Diagnosis present

## 2019-06-19 DIAGNOSIS — R451 Restlessness and agitation: Secondary | ICD-10-CM | POA: Insufficient documentation

## 2019-06-19 LAB — RAPID URINE DRUG SCREEN, HOSP PERFORMED
Amphetamines: POSITIVE — AB
Barbiturates: NOT DETECTED
Benzodiazepines: NOT DETECTED
Cocaine: NOT DETECTED
Opiates: NOT DETECTED
Tetrahydrocannabinol: POSITIVE — AB

## 2019-06-19 LAB — CBC WITH DIFFERENTIAL/PLATELET
Abs Immature Granulocytes: 0.01 10*3/uL (ref 0.00–0.07)
Basophils Absolute: 0 10*3/uL (ref 0.0–0.1)
Basophils Relative: 0 %
Eosinophils Absolute: 0 10*3/uL (ref 0.0–0.5)
Eosinophils Relative: 0 %
HCT: 37 % (ref 36.0–46.0)
Hemoglobin: 12.2 g/dL (ref 12.0–15.0)
Immature Granulocytes: 0 %
Lymphocytes Relative: 34 %
Lymphs Abs: 3.1 10*3/uL (ref 0.7–4.0)
MCH: 32.8 pg (ref 26.0–34.0)
MCHC: 33 g/dL (ref 30.0–36.0)
MCV: 99.5 fL (ref 80.0–100.0)
Monocytes Absolute: 0.8 10*3/uL (ref 0.1–1.0)
Monocytes Relative: 9 %
Neutro Abs: 5.2 10*3/uL (ref 1.7–7.7)
Neutrophils Relative %: 57 %
Platelets: 239 10*3/uL (ref 150–400)
RBC: 3.72 MIL/uL — ABNORMAL LOW (ref 3.87–5.11)
RDW: 13 % (ref 11.5–15.5)
WBC: 9.2 10*3/uL (ref 4.0–10.5)
nRBC: 0 % (ref 0.0–0.2)

## 2019-06-19 LAB — COMPREHENSIVE METABOLIC PANEL
ALT: 19 U/L (ref 0–44)
AST: 25 U/L (ref 15–41)
Albumin: 4.3 g/dL (ref 3.5–5.0)
Alkaline Phosphatase: 43 U/L (ref 38–126)
Anion gap: 11 (ref 5–15)
BUN: 16 mg/dL (ref 6–20)
CO2: 20 mmol/L — ABNORMAL LOW (ref 22–32)
Calcium: 9.1 mg/dL (ref 8.9–10.3)
Chloride: 108 mmol/L (ref 98–111)
Creatinine, Ser: 1.07 mg/dL — ABNORMAL HIGH (ref 0.44–1.00)
GFR calc Af Amer: >60 mL/min (ref >60)
GFR calc non Af Amer: >60 mL/min (ref >60)
Glucose, Bld: 81 mg/dL (ref 70–99)
Potassium: 3.6 mmol/L (ref 3.5–5.1)
Sodium: 139 mmol/L (ref 135–145)
Total Bilirubin: 1.2 mg/dL (ref 0.3–1.2)
Total Protein: 7.4 g/dL (ref 6.5–8.1)

## 2019-06-19 LAB — RESPIRATORY PANEL BY RT PCR (FLU A&B, COVID)
Influenza A by PCR: NEGATIVE
Influenza B by PCR: NEGATIVE
SARS Coronavirus 2 by RT PCR: NEGATIVE

## 2019-06-19 LAB — I-STAT BETA HCG BLOOD, ED (MC, WL, AP ONLY): I-stat hCG, quantitative: <5 m[IU]/mL (ref <5)

## 2019-06-19 LAB — ETHANOL: Alcohol, Ethyl (B): <10 mg/dL (ref <10)

## 2019-06-19 LAB — ACETAMINOPHEN LEVEL: Acetaminophen (Tylenol), Serum: <10 ug/mL — ABNORMAL LOW (ref 10–30)

## 2019-06-19 MED ORDER — LORAZEPAM 2 MG/ML IJ SOLN: 2.0000 mg | Freq: Once | INTRAMUSCULAR | Status: DC

## 2019-06-19 MED ORDER — ZIPRASIDONE MESYLATE 20 MG IM SOLR
20.0000 mg | Freq: Once | INTRAMUSCULAR | Status: AC
  Administered 2019-06-19: 20 mg via INTRAMUSCULAR

## 2019-06-19 MED ORDER — DIPHENHYDRAMINE HCL 50 MG/ML IJ SOLN
25.0000 mg | Freq: Once | INTRAMUSCULAR | Status: DC
  Filled 2019-06-19: qty 1

## 2019-06-19 MED ORDER — DIPHENHYDRAMINE HCL 50 MG/ML IJ SOLN: 25.0000 mg | Freq: Once | INTRAMUSCULAR | Status: DC

## 2019-06-19 MED ORDER — LORAZEPAM 2 MG/ML IJ SOLN
2.0000 mg | Freq: Once | INTRAMUSCULAR | Status: DC
  Filled 2019-06-19: qty 1
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
## 2019-06-19 NOTE — ED Notes (Signed)
Pt to room 27 . Pt oriented to unit. Pt calm, cooperative. Pt laying in bed in resting.

## 2019-06-19 NOTE — ED Notes (Signed)
Pt undressed into scrubs and belongings placed at nurses station in 1-4 cabinets.

## 2019-06-19 NOTE — ED Notes (Signed)
DC d off unit to home per provider. Pt alert, no s/s of distress.  DC information given to and reviewed with pt, pt acknowledged understanding. Belongings given to pt. Pt ambulatory off the unit, escorted by NT.  Pt using bus for transportation.

## 2019-06-19 NOTE — BHH Counselor (Signed)
Clinician spoke to [place], [place] and noted the pt recevied Geodon and is resting comfortably. RN to call clinician once pt is alert, oriented and able to engage in TTS assessment.   Per Lelon Mast, NT note at 0403:"Pt not awake enough to complete TTS at this time."  Redmond Pulling, MS, [facility], [facility] Triage Specialist [phone].

## 2019-06-19 NOTE — ED Notes (Signed)
Pt not awake enough to complete TTS at this time.

## 2019-06-19 NOTE — ED Notes (Signed)
Pt sleeping at this time.
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
## 2019-06-19 NOTE — ED Notes (Signed)
Primary RN Marchelle Folks aware of blood pressure of 90/51.

## 2019-06-19 NOTE — Patient Outreach (Signed)
ED Peer Support Specialist Patient Intake (Complete at intake & 30-60 Day Follow-up)  Name: Amy [name]  MRN: [id number]  Age: 24 y.o.   Date of Admission: 06/19/2019  Intake: Initial Comments:      Primary Reason Admitted: Medical Clearance, Aggressive Behavior   Lab values: Alcohol/ETOH: Negative Positive UDS? Yes Amphetamines: Yes Barbiturates: No Benzodiazepines: No Cocaine: No Opiates: No Cannabinoids: Yes  Demographic information: Gender: Female Ethnicity: Asian Marital Status: Single Insurance Status: Uninsured/Self-pay Ecologist (Work Neurosurgeon, Physicist, medical, etc.: No Lives with: Partner/Spouse Living situation: House/Apartment  Reported Patient History: Patient reported health conditions: None Patient aware of HIV and hepatitis status: No  In past year, has patient visited ED for any reason? No  Number of ED visits:    Reason(s) for visit:    In past year, has patient been hospitalized for any reason? No  Number of hospitalizations:    Reason(s) for hospitalization:    In past year, has patient been arrested? Yes  Number of arrests:    Reason(s) for arrest: [address] B  In past year, has patient been incarcerated? Yes  Number of incarcerations:    Reason(s) for incarceration:    In past year, has patient received medication-assisted treatment? No  In past year, patient received the following treatments:    In past year, has patient received any harm reduction services? No  Did this include any of the following?    In past year, has patient received care from a mental health provider for diagnosis other than SUD? No  In past year, is this first time patient has overdosed? No  Number of past overdoses:    In past year, is this first time patient has been hospitalized for an overdose? No  Number of hospitalizations for overdose(s):    Is patient currently receiving treatment for a mental health diagnosis?  No  Patient reports experiencing difficulty participating in SUD treatment: No    Most important reason(s) for this difficulty?    Has patient received prior services for treatment? No  In past, patient has received services from following agencies:    Plan of Care:  Suggested follow up at these agencies/treatment centers: Other (comment)  Other information: CPSS met with Pt an was able to process with Pt to gain more information to better assist Pt. CPSS was made aware that Pt was brought in to get off the streets because she was unable to get back into her hotel room. Pt made CPSS aware that she does not need any assistance at this time. CPSS left contact information to assist Pt in community.    Aaron Edelman Krishang [place], [place]  06/19/2019 12:46 PM

## 2019-06-19 NOTE — BH Assessment (Signed)
BHH Assessment Progress Note  Per Nelly Rout, MD, this pt does not require psychiatric hospitalization at this time.  Pt presents under IVC initiated by law enforcement and upheld by EDP Drema Pry, MD, which Dr Lucianne Muss has rescinded.  Pt is to be discharged from [facility] with recommendation to follow up with [facility].  This has been included in pt's discharge instructions.  Pt would also benefit from seeing Peer Support Specialists, and a peer support consult has been ordered for pt.  Pt's nurse, Waynetta Sandy, has been notified.  Doylene Canning, MA Triage Specialist [phone]

## 2019-06-19 NOTE — Discharge Instructions (Signed)
For your behavioral health needs, you are advised to follow up with [facility].  Contact them at your earliest opportunity to ask about scheduling an intake appointment: ° °     [facility] °     [address] °     [place], [place] [address]  °     [phone] °

## 2019-06-19 NOTE — BHH Suicide Risk Assessment (Cosign Needed Addendum)
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 rate 12, last menstrual period 05/06/2019, SpO2 98 %, unknown if currently breastfeeding.There is no height or weight on file to calculate BMI.  General Appearance: Casual and Fairly Groomed  Patent attorney::  Good  Speech:  Clear and Coherent and Normal Rate  Volume:  Normal  Mood:  Euthymic  Affect:  Appropriate and Congruent  Thought Process:  Coherent, Goal Directed and Descriptions of Associations: Intact  Orientation:  Full (Time, Place, and Person)  Thought Content:  WDL and Logical  Suicidal Thoughts:  No  Homicidal Thoughts:  No  Memory:  Immediate;   Good Recent;   Good Remote;   Good  Judgement:  Fair  Insight:  Fair  Psychomotor Activity:  Normal  Concentration:  Good  Recall:  Good  Fund of Knowledge:Good  Language: Good  Akathisia:  No  Handed:  Right  AIMS (if indicated):     Assets:  Architect Housing Leisure Time Physical Health Resilience Social Support Talents/Skills  Sleep:     Cognition: WNL  ADL's:  Intact   Mental Status Per Nursing Assessment::   On Admission:   Patient assessed by nurse practitioner along with Dr. Lucianne Muss.  Patient alert and oriented, answers appropriately.  Patient denies suicidal and homicidal ideations.  Patient denies access to weapons.  Patient denies auditory and visual hallucinations.  Patient minimizes substance use, states "I do not use like that."   Patient reports "this guy got me a room but he forgot to give me the key and I do not have an ID so they would not let me stay at the hotel last night." Patient reports supportive family.  Patient reports a plan to "take the bus to [address] and meet my friend, I did not want to come over to the side of town today."  Demographic Factors:  NA  Loss Factors: NA  Historical Factors: NA  Risk Reduction Factors:   Positive social support, Positive therapeutic relationship and Positive coping skills or problem solving skills  Continued Clinical Symptoms:  Alcohol/Substance Abuse/Dependencies  Cognitive Features That Contribute To Risk:  None    Suicide Risk:  Minimal: No identifiable suicidal ideation.  Patients presenting with no risk factors but with morbid ruminations; may be classified as minimal risk based on the severity of the depressive symptoms    Plan Of Care/Follow-up recommendations:  Peers support consult ordered. Other:  Follow up with substance use resources and outpatient psychiatry  [name], FNP 06/19/2019, 10:03 AM

## 2019-06-19 NOTE — ED Notes (Signed)
Was going to administer ativan and benadryl IM as ordered. When entered pt's room she was sitting on the side of the stretcher and obviously more subdued then before. She asked what medication was ordered and when told she sated "please don't give me that". While drawing up the medication, pt laid down onto the stretcher. Before giving pt was sleeping, arousable but notable drowsy. Ativan and Benadryl held and PA made aware. Will continue to monitor.

## 2019-06-19 NOTE — ED Notes (Signed)
Sitter at bedside with patient. Patient asleep and in no apparent distress at this time. Will continue to monitor.
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
## 2019-06-26 ENCOUNTER — Emergency Department (HOSPITAL_COMMUNITY)
Admission: EM | Admit: 2019-06-26 | Discharge: 2019-06-26 | Disposition: A | Discharge status: Home / Self Care | Payer: Self-pay | Attending: Emergency Medicine | Admitting: Emergency Medicine

## 2019-06-26 ENCOUNTER — Emergency Department
Admission: EM | Admit: 2019-06-26 | Discharge: 2019-06-27 | Disposition: A | Discharge status: Home / Self Care | Payer: Self-pay | Attending: Emergency Medicine | Admitting: Emergency Medicine

## 2019-06-26 ENCOUNTER — Encounter (HOSPITAL_COMMUNITY): Payer: Self-pay | Admitting: Emergency Medicine

## 2019-06-26 ENCOUNTER — Other Ambulatory Visit: Payer: Self-pay

## 2019-06-26 ENCOUNTER — Encounter: Payer: Self-pay | Admitting: Emergency Medicine

## 2019-06-26 DIAGNOSIS — F1410 Cocaine abuse, uncomplicated: Secondary | ICD-10-CM | POA: Insufficient documentation

## 2019-06-26 DIAGNOSIS — Z9119 Patient's noncompliance with other medical treatment and regimen: Secondary | ICD-10-CM

## 2019-06-26 DIAGNOSIS — J45909 Unspecified asthma, uncomplicated: Secondary | ICD-10-CM | POA: Insufficient documentation

## 2019-06-26 DIAGNOSIS — F12150 Cannabis abuse with psychotic disorder with delusions: Secondary | ICD-10-CM | POA: Diagnosis present

## 2019-06-26 DIAGNOSIS — F17210 Nicotine dependence, cigarettes, uncomplicated: Secondary | ICD-10-CM | POA: Insufficient documentation

## 2019-06-26 DIAGNOSIS — F411 Generalized anxiety disorder: Secondary | ICD-10-CM | POA: Diagnosis present

## 2019-06-26 DIAGNOSIS — F1210 Cannabis abuse, uncomplicated: Secondary | ICD-10-CM | POA: Diagnosis present

## 2019-06-26 DIAGNOSIS — F319 Bipolar disorder, unspecified: Secondary | ICD-10-CM | POA: Insufficient documentation

## 2019-06-26 DIAGNOSIS — F1994 Other psychoactive substance use, unspecified with psychoactive substance-induced mood disorder: Secondary | ICD-10-CM | POA: Diagnosis present

## 2019-06-26 DIAGNOSIS — F1220 Cannabis dependence, uncomplicated: Secondary | ICD-10-CM | POA: Diagnosis present

## 2019-06-26 DIAGNOSIS — Z590 Homelessness: Secondary | ICD-10-CM | POA: Insufficient documentation

## 2019-06-26 DIAGNOSIS — F209 Schizophrenia, unspecified: Secondary | ICD-10-CM | POA: Diagnosis present

## 2019-06-26 DIAGNOSIS — F250 Schizoaffective disorder, bipolar type: Secondary | ICD-10-CM | POA: Diagnosis present

## 2019-06-26 DIAGNOSIS — R443 Hallucinations, unspecified: Secondary | ICD-10-CM

## 2019-06-26 DIAGNOSIS — F3164 Bipolar disorder, current episode mixed, severe, with psychotic features: Secondary | ICD-10-CM | POA: Diagnosis present

## 2019-06-26 DIAGNOSIS — F1914 Other psychoactive substance abuse with psychoactive substance-induced mood disorder: Secondary | ICD-10-CM | POA: Insufficient documentation

## 2019-06-26 DIAGNOSIS — Z20822 Contact with and (suspected) exposure to covid-19: Secondary | ICD-10-CM | POA: Insufficient documentation

## 2019-06-26 DIAGNOSIS — R45851 Suicidal ideations: Secondary | ICD-10-CM | POA: Insufficient documentation

## 2019-06-26 DIAGNOSIS — F4320 Adjustment disorder, unspecified: Secondary | ICD-10-CM | POA: Insufficient documentation

## 2019-06-26 DIAGNOSIS — Z79899 Other long term (current) drug therapy: Secondary | ICD-10-CM | POA: Insufficient documentation

## 2019-06-26 DIAGNOSIS — F1910 Other psychoactive substance abuse, uncomplicated: Secondary | ICD-10-CM | POA: Diagnosis present

## 2019-06-26 DIAGNOSIS — Z91199 Patient's noncompliance with other medical treatment and regimen due to unspecified reason: Secondary | ICD-10-CM

## 2019-06-26 DIAGNOSIS — F1414 Cocaine abuse with cocaine-induced mood disorder: Secondary | ICD-10-CM | POA: Diagnosis present

## 2019-06-26 DIAGNOSIS — G4700 Insomnia, unspecified: Secondary | ICD-10-CM | POA: Diagnosis present

## 2019-06-26 DIAGNOSIS — F1510 Other stimulant abuse, uncomplicated: Secondary | ICD-10-CM | POA: Diagnosis present

## 2019-06-26 DIAGNOSIS — R110 Nausea: Secondary | ICD-10-CM | POA: Insufficient documentation

## 2019-06-26 DIAGNOSIS — F312 Bipolar disorder, current episode manic severe with psychotic features: Secondary | ICD-10-CM | POA: Diagnosis present

## 2019-06-26 DIAGNOSIS — F259 Schizoaffective disorder, unspecified: Secondary | ICD-10-CM | POA: Insufficient documentation

## 2019-06-26 LAB — COMPREHENSIVE METABOLIC PANEL
ALT: 20 U/L (ref 0–44)
ALT: 20 U/L (ref 0–44)
AST: 20 U/L (ref 15–41)
AST: 20 U/L (ref 15–41)
Albumin: 3.3 g/dL — ABNORMAL LOW (ref 3.5–5.0)
Albumin: 3.6 g/dL (ref 3.5–5.0)
Alkaline Phosphatase: 40 U/L (ref 38–126)
Alkaline Phosphatase: 42 U/L (ref 38–126)
Anion gap: 8 (ref 5–15)
Anion gap: 7 (ref 5–15)
BUN: 8 mg/dL (ref 6–20)
BUN: 7 mg/dL (ref 6–20)
CO2: 26 mmol/L (ref 22–32)
CO2: 27 mmol/L (ref 22–32)
Calcium: 8.5 mg/dL — ABNORMAL LOW (ref 8.9–10.3)
Calcium: 8.7 mg/dL — ABNORMAL LOW (ref 8.9–10.3)
Chloride: 107 mmol/L (ref 98–111)
Chloride: 105 mmol/L (ref 98–111)
Creatinine, Ser: 0.7 mg/dL (ref 0.44–1.00)
Creatinine, Ser: 0.69 mg/dL (ref 0.44–1.00)
GFR calc Af Amer: >60 mL/min (ref >60)
GFR calc Af Amer: >60 mL/min (ref >60)
GFR calc non Af Amer: >60 mL/min (ref >60)
GFR calc non Af Amer: >60 mL/min (ref >60)
Glucose, Bld: 92 mg/dL (ref 70–99)
Glucose, Bld: 87 mg/dL (ref 70–99)
Potassium: 3.7 mmol/L (ref 3.5–5.1)
Potassium: 3.8 mmol/L (ref 3.5–5.1)
Sodium: 141 mmol/L (ref 135–145)
Sodium: 139 mmol/L (ref 135–145)
Total Bilirubin: 0.7 mg/dL (ref 0.3–1.2)
Total Bilirubin: 0.5 mg/dL (ref 0.3–1.2)
Total Protein: 6 g/dL — ABNORMAL LOW (ref 6.5–8.1)
Total Protein: 6.7 g/dL (ref 6.5–8.1)

## 2019-06-26 LAB — URINE DRUG SCREEN, QUALITATIVE (ARMC ONLY)
Amphetamines, Ur Screen: NOT DETECTED
Barbiturates, Ur Screen: NOT DETECTED
Benzodiazepine, Ur Scrn: NOT DETECTED
Cannabinoid 50 Ng, Ur ~~LOC~~: POSITIVE — AB
Cocaine Metabolite,Ur ~~LOC~~: NOT DETECTED
MDMA (Ecstasy)Ur Screen: NOT DETECTED
Methadone Scn, Ur: NOT DETECTED
Opiate, Ur Screen: NOT DETECTED
Phencyclidine (PCP) Ur S: NOT DETECTED
Tricyclic, Ur Screen: NOT DETECTED

## 2019-06-26 LAB — ACETAMINOPHEN LEVEL
Acetaminophen (Tylenol), Serum: <10 ug/mL — ABNORMAL LOW (ref 10–30)
Acetaminophen (Tylenol), Serum: <10 ug/mL — ABNORMAL LOW (ref 10–30)

## 2019-06-26 LAB — CBC
HCT: 36.9 % (ref 36.0–46.0)
HCT: 37.1 % (ref 36.0–46.0)
Hemoglobin: 11.9 g/dL — ABNORMAL LOW (ref 12.0–15.0)
Hemoglobin: 12.1 g/dL (ref 12.0–15.0)
MCH: 32.1 pg (ref 26.0–34.0)
MCH: 32.2 pg (ref 26.0–34.0)
MCHC: 32.2 g/dL (ref 30.0–36.0)
MCHC: 32.6 g/dL (ref 30.0–36.0)
MCV: 99.5 fL (ref 80.0–100.0)
MCV: 98.7 fL (ref 80.0–100.0)
Platelets: 258 10*3/uL (ref 150–400)
Platelets: 241 10*3/uL (ref 150–400)
RBC: 3.71 MIL/uL — ABNORMAL LOW (ref 3.87–5.11)
RBC: 3.76 MIL/uL — ABNORMAL LOW (ref 3.87–5.11)
RDW: 13 % (ref 11.5–15.5)
RDW: 13 % (ref 11.5–15.5)
WBC: 6.6 10*3/uL (ref 4.0–10.5)
WBC: 4.6 10*3/uL (ref 4.0–10.5)
nRBC: 0 % (ref 0.0–0.2)
nRBC: 0 % (ref 0.0–0.2)

## 2019-06-26 LAB — RESPIRATORY PANEL BY RT PCR (FLU A&B, COVID)
Influenza A by PCR: NEGATIVE
Influenza A by PCR: NEGATIVE
Influenza B by PCR: NEGATIVE
Influenza B by PCR: NEGATIVE
SARS Coronavirus 2 by RT PCR: NEGATIVE
SARS Coronavirus 2 by RT PCR: NEGATIVE

## 2019-06-26 LAB — POC URINE PREG, ED: Preg Test, Ur: NEGATIVE

## 2019-06-26 LAB — SALICYLATE LEVEL
Salicylate Lvl: <7 mg/dL — ABNORMAL LOW (ref 7.0–30.0)
Salicylate Lvl: <7 mg/dL — ABNORMAL LOW (ref 7.0–30.0)

## 2019-06-26 LAB — ETHANOL
Alcohol, Ethyl (B): <10 mg/dL (ref <10)
Alcohol, Ethyl (B): <10 mg/dL (ref <10)

## 2019-06-26 LAB — LIPASE, BLOOD: Lipase: 28 U/L (ref 11–51)

## 2019-06-26 MED ORDER — MECLIZINE HCL 12.5 MG PO TABS: 12.5000 mg | ORAL_TABLET | Freq: Three times a day (TID) | ORAL | 0 refills | Status: AC | PRN

## 2019-06-26 MED ORDER — MECLIZINE HCL 25 MG PO TABS
12.5000 mg | ORAL_TABLET | Freq: Once | ORAL | Status: AC
  Administered 2019-06-26: 04:00:00 12.5 mg via ORAL
  Filled 2019-06-26: qty 1

## 2019-06-26 MED ORDER — IBUPROFEN 600 MG PO TABS
600.0000 mg | ORAL_TABLET | Freq: Once | ORAL | Status: AC
  Administered 2019-06-26: 600 mg via ORAL
  Filled 2019-06-26: qty 1

## 2019-06-26 NOTE — BH Assessment (Signed)
Assessment Note  Amy [name] is an 24 y.o. female presenting voluntarily to [facility] ED stating "she just does not feel good." Patient is well known to [facility] and surrounding EDs due to her history of mental illness and substance use. Upon this counselor's exam patient has difficulty staying awake. She states "I don't feel good, I'm weak, and I'm having panic attacks." Patient reports she is currently trying to find a place to stay. When asked about SI she states "No, I mean yes." Patient states she does not have intent or plan. She denies HI/AVH. Patient denies any substance use. Sample for UDS not yet collected at time of assessment. When asked if she felt she needed to go to behavioral health or needs assistance from psychiatry she states "No."   Diagnosis:   Past Medical History:  Past Medical History:  Diagnosis Date  . Anxiety   . Asthma   . Bipolar disorder (HCC)   . Cannabis abuse   . Depression   . 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 Schizo affective schizophrenia (HCC)     History reviewed. No pertinent surgical history.  Family History:  Family History  Problem Relation Age of Onset  . Mental illness Neg Hx     Social History:  reports that she has been smoking cigarettes. She has been smoking about 0.50 packs per day. She has never used smokeless tobacco. She reports previous alcohol use. She reports current drug use. Drug: Marijuana.  Additional Social History:  Alcohol / Drug Use Pain Medications: See MARs Prescriptions: See MARs Over the Counter: See MARs History of alcohol / drug use?: Yes Substance #1 Name of Substance 1: Cannabis 1 - Age of First Use: unknown 1 - Amount (size/oz): 1-2 blunts 1 - Frequency: unknown 1 - Duration: ongoing 1 - Last Use / Amount: last week Substance #2 Name of Substance 2: meth 2 - Age of First Use: unknown 2 - Amount (size/oz): unknown 2 - Frequency: unknown 2 - Duration: ongoing 2 - Last Use / Amount: last week  CIWA:  CIWA-Ar BP: (!) 101/50 Pulse Rate: 71 COWS:    Allergies:  Allergies  Allergen Reactions  . Penicillins Rash    Has patient had a PCN reaction causing immediate rash, facial/tongue/throat swelling, SOB or lightheadedness with hypotension: Yes Has patient had a PCN reaction causing severe rash involving mucus membranes or skin necrosis: No Has patient had a PCN reaction that required hospitalization: No Has patient had a PCN reaction occurring within the last 10 years: Yes If all of the above answers are "NO", then may   . Cogentin [Benztropine] Itching  . Divalproex Sodium Other (See Comments)    Creates feelings of paranoia, some suicidal feelings, and makes her feel that "people are coming after" her Creates feelings of paranoia, some suicidal feelings, and makes her feel that "people are coming after" her  . Risperidone Other (See Comments)    "Makes me cough"  . Valproic Acid Other (See Comments)    Creates paranoia/Per [facility] Creates paranoia/Per [facility]    Home Medications: (Not in a hospital admission)   OB/GYN Status:  Patient's last menstrual period was 06/18/2019 (approximate).  General Assessment Data Location of Assessment: [facility] ED TTS Assessment: In system Is this a Tele or Face-to-Face Assessment?: Tele Assessment Is this an Initial Assessment or a Re-assessment for this encounter?: Initial Assessment Patient Accompanied by:: [name] Language Other than English: No Living Arrangements: Other (Comment) What gender do you  identify as?: Female Marital status: Single Maiden name: Lizak Pregnancy Status: Unknown Living Arrangements: Other (Comment) Can pt return to current living arrangement?: Yes Admission Status: Voluntary Is patient capable of signing voluntary admission?: Yes Referral Source: Self/Family/Friend Insurance type: none     Crisis Care Plan Living Arrangements: Other (Comment) Legal Guardian: (self) Name of Psychiatrist:  none Name of Therapist: none  Education Status Is patient currently in school?: No Is the patient employed, unemployed or receiving disability?: Unemployed  Risk to self with the past 6 months Suicidal Ideation: Yes-Currently Present Has patient been a risk to self within the past 6 months prior to admission? : No Suicidal Intent: No Has patient had any suicidal intent within the past 6 months prior to admission? : Yes Is patient at risk for suicide?: No Suicidal Plan?: No-Not Currently/Within Last 6 Months Has patient had any suicidal plan within the past 6 months prior to admission? : No Access to Means: No What has been your use of drugs/alcohol within the last 12 months?: cannabis and meth Previous Attempts/Gestures: No How many times?: 1 Other Self Harm Risks: drug use Triggers for Past Attempts: None known Intentional Self Injurious Behavior: None Family Suicide History: No Recent stressful life event(s): Conflict (Comment) Persecutory voices/beliefs?: No Depression: No Depression Symptoms: Feeling angry/irritable Substance abuse history and/or treatment for substance abuse?: Yes Suicide prevention information given to non-admitted patients: Not applicable  Risk to Others within the past 6 months Homicidal Ideation: No Does patient have any lifetime risk of violence toward others beyond the six months prior to admission? : No Thoughts of Harm to Others: No Current Homicidal Intent: No Current Homicidal Plan: No Access to Homicidal Means: No Identified Victim: none History of harm to others?: No Assessment of Violence: None Noted Violent Behavior Description: NA Does patient have access to weapons?: No Criminal Charges Pending?: No Describe Pending Criminal Charges: none Does patient have a court date: No Is patient on probation?: No  Psychosis Hallucinations: None noted Delusions: None noted  Mental Status Report Appearance/Hygiene: In scrubs Eye Contact:  Poor Motor Activity: Freedom of movement Speech: Logical/coherent Level of Consciousness: Quiet/awake, Drowsy Mood: Irritable Affect: Appropriate to circumstance Anxiety Level: Minimal Panic attack frequency: (not currently but reports frequent) Most recent panic attack: 06/25/19 Thought Processes: Coherent, Relevant Judgement: Impaired Orientation: Person, Place, Appropriate for developmental age Obsessive Compulsive Thoughts/Behaviors: None  Cognitive Functioning Concentration: Poor Memory: Recent Intact, Remote Intact Is patient IDD: No Insight: Poor Impulse Control: Fair Appetite: Good Have you had any weight changes? : No Change Sleep: No Change Total Hours of Sleep: 7 Vegetative Symptoms: None  ADLScreening [facility]) Patient's cognitive ability adequate to safely complete daily activities?: Yes Patient able to express need for assistance with ADLs?: Yes Independently performs ADLs?: Yes (appropriate for developmental age)  Prior Inpatient Therapy Prior Inpatient Therapy: Yes Prior Therapy Dates: multiple Prior Therapy Facilty/Provider(s): [facility] Reason for Treatment: MH  Prior Outpatient Therapy Prior Outpatient Therapy: No Prior Therapy Dates: unknown Prior Therapy Facilty/Provider(s): [facility] Reason for Treatment: Medication management Does patient have an ACCT team?: No Does patient have Intensive In-House Services?  : No Does patient have [facility] services? : No Does patient have P4CC services?: No  ADL Screening (condition at time of admission) Patient's cognitive ability adequate to safely complete daily activities?: Yes Is the patient deaf or have difficulty hearing?: No Does the patient have difficulty seeing, even when wearing glasses/contacts?: No Does the patient have difficulty concentrating, remembering, or making decisions?: No  Patient able to express need for assistance with ADLs?: Yes Does the patient have difficulty dressing or  bathing?: No Independently performs ADLs?: Yes (appropriate for developmental age) Does the patient have difficulty walking or climbing stairs?: No Weakness of Legs: None Weakness of Arms/Hands: None  Home Assistive Devices/Equipment Home Assistive Devices/Equipment: None  Therapy Consults (therapy consults require a physician order) PT Evaluation Needed: No OT Evalulation Needed: No SLP Evaluation Needed: No Abuse/Neglect Assessment (Assessment to be complete while patient is alone) Physical Abuse: Denies Verbal Abuse: Denies Sexual Abuse: Denies Exploitation of patient/patient's resources: Denies Self-Neglect: Denies Values / Beliefs Cultural Requests During Hospitalization: None Spiritual Requests During Hospitalization: None Consults Spiritual Care Consult Needed: No Transition of Care Team Consult Needed: No Advance Directives (For Healthcare) Does Patient Have a Medical Advance Directive?: No Would patient like information on creating a medical advance directive?: No - Patient declined          Disposition:  Disposition Initial Assessment Completed for this Encounter: Yes  On Site Evaluation by:   Reviewed with Physician:    Orvis Brill 06/26/2019 9:29 AM

## 2019-06-26 NOTE — ED Notes (Signed)
Unknown as to why patient was not dressed out and belongings taken when she was brought back at 0630. Patient has been dressed out, wanded, and belongings taken at this time. EDP at bedside.

## 2019-06-26 NOTE — ED Notes (Signed)
Patient made aware ua sample is needed. Unable to provide at this time.

## 2019-06-26 NOTE — ED Notes (Signed)
Gave pt turkey tray and drink.AS 

## 2019-06-26 NOTE — Discharge Instructions (Signed)
For your behavioral health needs you are advised to follow up with [facility].  New patients are seen at their walk-in clinic.  Walk-in hours are Monday - Friday from 8:30 am - 12:00 pm, and from 1:00 pm - 2:30 pm.  Walk-in patients are seen on a first come, first served basis, so try to arrive as early as possible for the best chance of being seen the same day:       [facility]      [address]      [place], [place] [address]      [phone]  For your shelter needs, you are advised to contact [facility] at your earliest opportunity:       [facility]      [phone]  For other supportive services, contact the [facility]:       [facility]      [address]      [place], [place] [address]      [phone]  

## 2019-06-26 NOTE — ED Provider Notes (Signed)
[facility]-EMERGENCY DEPT Provider Note   CSN: [id number] Arrival date & time: 06/26/19  9323     History Chief Complaint  Patient presents with  . Anxiety  . Suicidal    Amy [name] is a 24 y.o. female.  24 yo F with a chief complaints of suicidal ideation.  Patient has a history of mental illness.  Patient is compliant with her medications.  Tells me a lot is going on in her life.  Is unwilling to discuss much further.  She also tells me that she feels weak.  She would not describe this any further.  States she denies cough denies fever denies abdominal pain vomiting or diarrhea.  The history is provided by the patient.  Illness Severity:  Moderate Onset quality:  Gradual Duration:  2 days Timing:  Constant Progression:  Worsening Chronicity:  New Associated symptoms: no chest pain, no congestion, no fever, no headaches, no myalgias, no nausea, no rhinorrhea, no shortness of breath, no vomiting and no wheezing        Past Medical History:  Diagnosis Date  . Anxiety   . Asthma   . Bipolar disorder (HCC)   . Cannabis abuse   . Depression   . 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 Schizo affective schizophrenia [facility])     Patient Active Problem List   Diagnosis Date Noted  . Schizophrenia (HCC) 10/28/2018  . Schizoaffective psychosis (HCC) 05/18/2018  . Cannabis abuse 05/04/2018  . 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 Substance induced mood disorder (HCC) 11/14/2017  . 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 Substance abuse (HCC)   . Schizoaffective disorder, bipolar type (HCC) 04/22/2017  . Cocaine abuse with cocaine-induced mood disorder (HCC) 12/16/2016  . Bipolar disorder, curr episode mixed, severe, with psychotic features (HCC) 10/25/2016  . Cocaine use disorder, mild, abuse (HCC) 10/25/2016  . Cannabis use disorder, severe, dependence (HCC) 10/25/2016  . Insomnia   . Anxiety state   . Cocaine abuse  (HCC) 07/04/2015  . Cannabis abuse, continuous 07/02/2015  . Non compliance with medical treatment 06/18/2015  . Bipolar I disorder, current or most recent episode manic, with psychotic features (HCC) 03/02/2015    History reviewed. No pertinent surgical history.   OB History    Gravida  1   Para      Term      Preterm      AB      Living        SAB      TAB      Ectopic      Multiple      Live Births              Family History  Problem Relation Age of Onset  . Mental illness Neg Hx     Social History   Tobacco Use  . Smoking status: Current Some Day Smoker    Packs/day: 0.50    Types: Cigarettes  . Smokeless tobacco: Never Used  Substance Use Topics  . Alcohol use: Not Currently  . Drug use: Yes    Types: Marijuana    Home Medications Prior to Admission medications   Medication Sig Start Date End Date Taking? Authorizing Provider  meclizine (ANTIVERT) 12.5 MG tablet Take 1 tablet (12.5 mg total) by mouth 3 (three) times daily as needed for dizziness. 06/26/19   Mesner, Barbara Cower, MD  amantadine (SYMMETREL) 100 MG capsule Take 1 capsule (100 mg total) by  mouth 2 (two) times daily. Patient not taking: Reported on 06/19/2019 05/24/19 06/26/19  Emmaline Kluver, FNP  ARIPiprazole (ABILIFY) 5 MG tablet Take 1 tablet (5 mg total) by mouth daily. Patient not taking: Reported on 06/19/2019 05/25/19 06/26/19  Emmaline Kluver, FNP  carbamazepine (TEGRETOL) 100 MG chewable tablet Chew 1 tablet (100 mg total) by mouth 2 (two) times daily. Patient not taking: Reported on 06/19/2019 05/24/19 06/26/19  Emmaline Kluver, FNP  fluPHENAZine (PROLIXIN) 10 MG tablet Take 2 tablets (20 mg total) by mouth at bedtime. Patient not taking: Reported on 06/19/2019 05/24/19 06/26/19  Emmaline Kluver, FNP    Allergies    Penicillins, Cogentin [benztropine], Divalproex sodium, Risperidone, and Valproic acid  Review of Systems   Review of Systems  Constitutional: Negative for chills and fever.  HENT: Negative  for congestion and rhinorrhea.   Eyes: Negative for redness and visual disturbance.  Respiratory: Negative for shortness of breath and wheezing.   Cardiovascular: Negative for chest pain and palpitations.  Gastrointestinal: Negative for nausea and vomiting.  Genitourinary: Negative for dysuria and urgency.  Musculoskeletal: Negative for arthralgias and myalgias.  Skin: Negative for pallor and wound.  Neurological: Positive for weakness. Negative for dizziness and headaches.    Physical Exam Updated Vital Signs BP (!) 101/50   Pulse 71   Temp 98.5 F (36.9 C) (Oral)   Resp 15   Ht 6\' 1"  (1.854 m)   Wt 72.6 kg   LMP 06/18/2019 (Approximate)   SpO2 97%   BMI 21.11 kg/m   Physical Exam Vitals and nursing note reviewed.  Constitutional:      General: She is not in acute distress.    Appearance: She is well-developed. She is not diaphoretic.  HENT:     Head: Normocephalic and atraumatic.  Eyes:     Pupils: Pupils are equal, round, and reactive to light.  Cardiovascular:     Rate and Rhythm: Normal rate and regular rhythm.     Heart sounds: No murmur. No friction rub. No gallop.   Pulmonary:     Effort: Pulmonary effort is normal.     Breath sounds: No wheezing or rales.  Abdominal:     General: There is no distension.     Palpations: Abdomen is soft.     Tenderness: There is no abdominal tenderness.  Musculoskeletal:        General: No tenderness.     Cervical back: Normal range of motion and neck supple.  Skin:    General: Skin is warm and dry.  Neurological:     Mental Status: She is alert and oriented to person, place, and time.  Psychiatric:        Mood and Affect: Affect is blunt.        Behavior: Behavior normal.     ED Results / Procedures / Treatments   Labs (all labs ordered are listed, but only abnormal results are displayed) Labs Reviewed  SALICYLATE LEVEL - Abnormal; Notable for the following components:      Result Value   Salicylate Lvl <9.3 (*)     All other components within normal limits  ACETAMINOPHEN LEVEL - Abnormal; Notable for the following components:   Acetaminophen (Tylenol), Serum <10 (*)    All other components within normal limits  RESPIRATORY PANEL BY RT PCR (FLU A&B, COVID)  ETHANOL  RAPID URINE DRUG SCREEN, HOSP PERFORMED    EKG None  Radiology No results found.  Procedures Procedures (including critical care  time)  Medications Ordered in ED Medications - No data to display  ED Course  I have reviewed the triage vital signs and the nursing notes.  Pertinent labs & imaging results that were available during my care of the patient were reviewed by me and considered in my medical decision making (see chart for details).    MDM Rules/Calculators/A&P                      24 yo F with a chief complaints of suicidal ideation.  Patient had lab work done less than 24 hours ago.  Do not feel that it needs to be repeated.  She did have an alcohol or Tylenol and salicylate levels.  I feel she is likely medically clear.  TTS evaluation.    Here unremarkable.  Alcohol salicylate and Tylenol levels are negative.  Rapid Covid negative.  TTS evaluated and felt she could be discharged home safely.  Outpatient follow-up.  10:56 AM:  I have discussed the diagnosis/risks/treatment options with the patient and believe the pt to be eligible for discharge home to follow-up with PCP, Psych. We also discussed returning to the ED immediately if new or worsening sx occur. We discussed the sx which are most concerning (e.g., sudden worsening pain, fever, inability to tolerate by mouth) that necessitate immediate return. Medications administered to the patient during their visit and any new prescriptions provided to the patient are listed below.  Medications given during this visit Medications - No data to display   The patient appears reasonably screen and/or stabilized for discharge and I doubt any other medical condition or  other [facility] requiring further screening, evaluation, or treatment in the ED at this time prior to discharge.   Final Clinical Impression(s) / ED Diagnoses Final diagnoses:  Suicidal ideation    Rx / DC Orders ED Discharge Orders    None       Melene Plan, DO 06/26/19 1056

## 2019-06-26 NOTE — ED Notes (Signed)
TTS and NP talking to patient. 

## 2019-06-26 NOTE — ED Triage Notes (Signed)
Pt arriving stating she "just doesn't feel good." Pt reports increased anxiety and suicidal ideations without plan.

## 2019-06-26 NOTE — ED Notes (Signed)
RN went to discharge patient and patient stated "I usually see at least 2 more people before I get told to leave". Patient screaming and demanding to see a provider. Dr. Jonn Shingles stopped in room and told patient she is up for discharge. Patient given back all 4 bags of belongings and escorted out of ED by security.

## 2019-06-26 NOTE — ED Notes (Signed)
Pt not in the lobby 

## 2019-06-26 NOTE — ED Provider Notes (Signed)
[facility] Emergency Department Provider Note    First MD Initiated Contact with Patient 06/26/19 1856     (approximate)  I have reviewed the triage vital signs and the nursing notes.   HISTORY  Chief Complaint Psychiatric Evaluation    HPI Amy [name] is a 24 y.o. female below listed past medical history presents the ER due to concern that "metal bodies are preventing her from putting trauma back in office.  ".  States she is also having suicidal ideations.  Very difficult to follow her train of thought.  Review of the record she is been seen to other facilities twice in the past 24 hours.  Denies any nausea or vomiting no pain.    Past Medical History:  Diagnosis Date  . Anxiety   . Asthma   . Bipolar disorder ([place])   . Cannabis abuse   . Depression   . Methamphetamine abuse ([place])   . Schizo affective schizophrenia ([place])    Family History  Problem Relation Age of Onset  . Mental illness Neg Hx    History reviewed. No pertinent surgical history. Patient Active Problem List   Diagnosis Date Noted  . Schizophrenia ([place]) 10/28/2018  . Schizoaffective psychosis ([place]) 05/18/2018  . Cannabis abuse 05/04/2018  . 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 Substance induced mood disorder ([place]) 11/14/2017  . 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 Substance abuse ([place])   . Schizoaffective disorder, bipolar type ([place]) 04/22/2017  . Cocaine abuse with cocaine-induced mood disorder ([place]) 12/16/2016  . Bipolar disorder, curr episode mixed, severe, with psychotic features ([place]) 10/25/2016  . Cocaine use disorder, mild, abuse ([place]) 10/25/2016  . Cannabis use disorder, severe, dependence ([place]) 10/25/2016  . Insomnia   . Anxiety state   . Cocaine abuse ([place]) 07/04/2015  . Cannabis abuse, continuous 07/02/2015  . Non compliance with medical treatment 06/18/2015  . Bipolar I disorder, current or most recent episode manic,  with psychotic features ([place]) 03/02/2015      Prior to Admission medications   Medication Sig Start Date End Date Taking? Authorizing Provider  meclizine (ANTIVERT) 12.5 MG tablet Take 1 tablet (12.5 mg total) by mouth 3 (three) times daily as needed for dizziness. 06/26/19   Mesner, Corene Cornea, MD  amantadine (SYMMETREL) 100 MG capsule Take 1 capsule (100 mg total) by mouth 2 (two) times daily. Patient not taking: Reported on 06/19/2019 05/24/19 06/26/19  Emmaline Kluver, FNP  ARIPiprazole (ABILIFY) 5 MG tablet Take 1 tablet (5 mg total) by mouth daily. Patient not taking: Reported on 06/19/2019 05/25/19 06/26/19  Emmaline Kluver, FNP  carbamazepine (TEGRETOL) 100 MG chewable tablet Chew 1 tablet (100 mg total) by mouth 2 (two) times daily. Patient not taking: Reported on 06/19/2019 05/24/19 06/26/19  Emmaline Kluver, FNP  fluPHENAZine (PROLIXIN) 10 MG tablet Take 2 tablets (20 mg total) by mouth at bedtime. Patient not taking: Reported on 06/19/2019 05/24/19 06/26/19  Emmaline Kluver, FNP    Allergies Penicillins, Cogentin [benztropine], Divalproex sodium, Risperidone, and Valproic acid    Social History Social History   Tobacco Use  . Smoking status: Current Some Day Smoker    Packs/day: 0.50    Types: Cigarettes  . Smokeless tobacco: Never Used  Substance Use Topics  . Alcohol use: Not Currently  . Drug use: Yes    Types: Marijuana    Review of Systems Patient denies headaches, rhinorrhea, blurry vision, numbness, shortness of breath, chest pain,  edema, cough, abdominal pain, nausea, vomiting, diarrhea, dysuria, fevers, rashes or hallucinations unless otherwise stated above in HPI. ____________________________________________   PHYSICAL EXAM:  VITAL SIGNS: Vitals:   06/26/19 1757  BP: 112/72  Pulse: 71  Resp: 16  Temp: 99.1 F (37.3 C)  SpO2: 100%    Constitutional: Alert and oriented.  Eyes: Conjunctivae are normal.  Head: Atraumatic. Nose: No congestion/rhinnorhea. Mouth/Throat: Mucous  membranes are moist.   Neck: No stridor. Painless ROM.  Cardiovascular: Normal rate, regular rhythm. Grossly normal heart sounds.  Good peripheral circulation. Respiratory: Normal respiratory effort.  No retractions. Lungs CTAB. Gastrointestinal: Soft and nontender. No distention. No abdominal bruits. No CVA tenderness. Genitourinary:  Musculoskeletal: No lower extremity tenderness nor edema.  No joint effusions. Neurologic:  Normal speech and language. No gross focal neurologic deficits are appreciated. No facial droop Skin:  Skin is warm, dry and intact. No rash noted. Psychiatric: manic appearing, with flight of ideas  ____________________________________________   LABS (all labs ordered are listed, but only abnormal results are displayed)  Results for orders placed or performed during the hospital encounter of 06/26/19 (from the past 24 hour(s))  Urine Drug Screen, Qualitative     Status: Abnormal   Collection Time: 06/26/19  6:00 PM  Result Value Ref Range   Tricyclic, Ur Screen NONE DETECTED NONE DETECTED   Amphetamines, Ur Screen NONE DETECTED NONE DETECTED   MDMA (Ecstasy)Ur Screen NONE DETECTED NONE DETECTED   Cocaine Metabolite,Ur [place] NONE DETECTED NONE DETECTED   Opiate, Ur Screen NONE DETECTED NONE DETECTED   Phencyclidine (PCP) Ur S NONE DETECTED NONE DETECTED   Cannabinoid 50 Ng, Ur [place] POSITIVE (A) NONE DETECTED   Barbiturates, Ur Screen NONE DETECTED NONE DETECTED   Benzodiazepine, Ur Scrn NONE DETECTED NONE DETECTED   Methadone Scn, Ur NONE DETECTED NONE DETECTED  Comprehensive metabolic panel     Status: Abnormal   Collection Time: 06/26/19  6:05 PM  Result Value Ref Range   Sodium 139 135 - 145 mmol/L   Potassium 3.8 3.5 - 5.1 mmol/L   Chloride 105 98 - 111 mmol/L   CO2 27 22 - 32 mmol/L   Glucose, Bld 87 70 - 99 mg/dL   BUN 7 6 - 20 mg/dL   Creatinine, Ser 1.01 0.44 - 1.00 mg/dL   Calcium 8.7 (L) 8.9 - 10.3 mg/dL   Total Protein 6.7 6.5 - 8.1 g/dL   Albumin  3.6 3.5 - 5.0 g/dL   AST 20 15 - 41 U/L   ALT 20 0 - 44 U/L   Alkaline Phosphatase 42 38 - 126 U/L   Total Bilirubin 0.5 0.3 - 1.2 mg/dL   GFR calc non Af Amer >60 >60 mL/min   GFR calc Af Amer >60 >60 mL/min   Anion gap 7 5 - 15  Salicylate level     Status: Abnormal   Collection Time: 06/26/19  6:05 PM  Result Value Ref Range   Salicylate Lvl <7.0 (L) 7.0 - 30.0 mg/dL  Acetaminophen level     Status: Abnormal   Collection Time: 06/26/19  6:05 PM  Result Value Ref Range   Acetaminophen (Tylenol), Serum <10 (L) 10 - 30 ug/mL  cbc     Status: Abnormal   Collection Time: 06/26/19  6:05 PM  Result Value Ref Range   WBC 4.6 4.0 - 10.5 K/uL   RBC 3.76 (L) 3.87 - 5.11 MIL/uL   Hemoglobin 12.1 12.0 - 15.0 g/dL   HCT 75.1 02.5 - 85.2 %  MCV 98.7 80.0 - 100.0 fL   MCH 32.2 26.0 - 34.0 pg   MCHC 32.6 30.0 - 36.0 g/dL   RDW 21.1 94.1 - 74.0 %   Platelets 241 150 - 400 K/uL   nRBC 0.0 0.0 - 0.2 %   ____________________________________________ ____________________________________________  RADIOLOGY   ____________________________________________   PROCEDURES  Procedure(s) performed:  Procedures    Critical Care performed: no ____________________________________________   INITIAL IMPRESSION / ASSESSMENT AND PLAN / ED COURSE  Pertinent labs & imaging results that were available during my care of the patient were reviewed by me and considered in my medical decision making (see chart for details).   DDX: Psychosis, delirium, medication effect, noncompliance, polysubstance abuse, Si, Hi, depression   Schwanda C Schwenn is a 24 y.o. who presents to the ED with for evaluation of hallucination.  Patient has psych history as list above.  Laboratory testing was ordered to evaluation for underlying electrolyte derangement or signs of underlying organic pathology to explain today's presentation.  Based on history and physical and laboratory evaluation, it appears that the patient's  presentation is 2/2 underlying psychiatric disorder and will require further evaluation and management by inpatient psychiatry.  Currently does not meet criteria for IVC. Disposition pending psychiatric evaluation.      The patient was evaluated in Emergency Department today for the symptoms described in the history of present illness. He/she was evaluated in the context of the global COVID-19 pandemic, which necessitated consideration that the patient might be at risk for infection with the SARS-CoV-2 virus that causes COVID-19. Institutional protocols and algorithms that pertain to the evaluation of patients at risk for COVID-19 are in a state of rapid change based on information released by regulatory bodies including the CDC and federal and state organizations. These policies and algorithms were followed during the patient's care in the ED.  As part of my medical decision making, I reviewed the following data within the electronic MEDICAL RECORD NUMBER Nursing notes reviewed and incorporated, Labs reviewed, notes from prior ED visits and [place] Controlled Substance Database   ____________________________________________   FINAL CLINICAL IMPRESSION(S) / ED DIAGNOSES  Final diagnoses:  Hallucinations      NEW MEDICATIONS STARTED DURING THIS VISIT:  New Prescriptions   No medications on file     Note:  This document was prepared using Dragon voice recognition software and may include unintentional dictation errors.    Willy Eddy, MD 06/26/19 [facility]

## 2019-06-26 NOTE — ED Triage Notes (Addendum)
Pt presents to ED via POV for psychiatric evaluation. Pt states "I'm having thoughts about killing myself". Pt states "I don't feel good, not like covid, I just feel sad and depressed, just found out all the new information in this world". Pt noted to be manic in triage, noted to be somewhat paranoid, states someone robotic "or not real" is following her. When asked about plan pt states "I mean it's not like that, you know not like no columbine or nothing".   Pt seen at [facility] ED and [facility] ED earlier today, seen at [facility] ED for same.

## 2019-06-26 NOTE — ED Provider Notes (Signed)
Emergency Department Provider Note   I have reviewed the triage vital signs and the nursing notes.   HISTORY  Chief Complaint Nausea   HPI Amy [name] is a 24 y.o. female who presents to the emergency department for nausea and weakness. No fever, abdominal pain, drug use, alcohol use or sick contacts. No respiratory symptoms. Is worried she might have COVID. No urinary symptoms. No diarrhea or constipation.  also thinks she might be pregnant.   No other associated or modifying symptoms.    Past Medical History:  Diagnosis Date  . Anxiety   . Asthma   . Bipolar disorder ([place])   . Cannabis abuse   . Depression   . Methamphetamine abuse ([place])   . Schizo affective schizophrenia [facility])     Patient Active Problem List   Diagnosis Date Noted  . Schizophrenia ([place]) 10/28/2018  . Schizoaffective psychosis ([place]) 05/18/2018  . Cannabis abuse 05/04/2018  . 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 Substance induced mood disorder ([place]) 11/14/2017  . 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 Substance abuse ([place])   . Schizoaffective disorder, bipolar type ([place]) 04/22/2017  . Cocaine abuse with cocaine-induced mood disorder ([place]) 12/16/2016  . Bipolar disorder, curr episode mixed, severe, with psychotic features ([place]) 10/25/2016  . Cocaine use disorder, mild, abuse ([place]) 10/25/2016  . Cannabis use disorder, severe, dependence ([place]) 10/25/2016  . Insomnia   . Anxiety state   . Cocaine abuse ([place]) 07/04/2015  . Cannabis abuse, continuous 07/02/2015  . Non compliance with medical treatment 06/18/2015  . Bipolar I disorder, current or most recent episode manic, with psychotic features ([place]) 03/02/2015    No past surgical history on file.  Current Outpatient Rx  . Order #: [id number] Class: Print    Allergies Penicillins, Cogentin [benztropine], Divalproex sodium, Risperidone, and Valproic acid  Family History  Problem Relation Age of Onset    . Mental illness Neg Hx     Social History Social History   Tobacco Use  . Smoking status: Current Some Day Smoker    Packs/day: 0.50    Types: Cigarettes  . Smokeless tobacco: Never Used  Substance Use Topics  . Alcohol use: Not Currently  . Drug use: Yes    Types: Marijuana    Review of Systems  All other systems negative except as documented in the HPI. All pertinent positives and negatives as reviewed in the HPI. ____________________________________________   PHYSICAL EXAM:  VITAL SIGNS: ED Triage Vitals  Enc Vitals Group     BP 06/26/19 0031 95/62     Pulse Rate 06/26/19 0031 63     Resp 06/26/19 0031 20     Temp 06/26/19 0031 98.6 F (37 C)     Temp Source 06/26/19 0031 Oral     SpO2 06/26/19 0031 100 %    Constitutional: Alert and oriented. Well appearing and in no acute distress. Eyes: Conjunctivae are normal. PERRL. EOMI. Head: Atraumatic. Nose: No congestion/rhinnorhea. Mouth/Throat: Mucous membranes are moist.  Oropharynx non-erythematous. Neck: No stridor.  No meningeal signs.   Cardiovascular: Normal rate, regular rhythm. Good peripheral circulation. Grossly normal heart sounds.   Respiratory: Normal respiratory effort.  No retractions. Lungs CTAB. Gastrointestinal: Soft and nontender. No distention.  Musculoskeletal: No lower extremity tenderness nor edema. No gross deformities of extremities. Neurologic:  Normal speech and language. No gross focal neurologic deficits are appreciated.  Skin:  Skin is warm, dry and intact. No rash noted.  ____________________________________________  LABS (all labs ordered are listed, but only abnormal results are displayed)  Labs Reviewed  COMPREHENSIVE METABOLIC PANEL - Abnormal; Notable for the following components:      Result Value   Calcium 8.5 (*)    Total Protein 6.0 (*)    Albumin 3.3 (*)    All other components within normal limits  CBC - Abnormal; Notable for the following components:   RBC  3.71 (*)    Hemoglobin 11.9 (*)    All other components within normal limits  LIPASE, BLOOD  URINALYSIS, ROUTINE W REFLEX MICROSCOPIC  POC URINE PREG, ED   ____________________________________________  EKG   EKG Interpretation  Date/Time:    Ventricular Rate:    PR Interval:    QRS Duration:   QT Interval:    QTC Calculation:   R Axis:     Text Interpretation:         ____________________________________________  RADIOLOGY  No results found.  ____________________________________________   PROCEDURES  Procedure(s) performed:   Procedures   ____________________________________________   INITIAL IMPRESSION / ASSESSMENT AND PLAN / ED COURSE  Nausea improved prior to medication to the point where she can eat a sandwich even after she asked for.  Discussed with her the possibility of getting Covid test and she did not want one.  Patient discharged.  After patient discharged she told the triage nurse that she went to check back in because she thought she was having panic attack.  I reevaluated her and she stated that she needed to see psychiatry immediately because she was manic and was having a panic attack.  She had no other new symptoms.  She is not homicidal or suicidal.  She is very calm in her demeanor.  She does have some mild delusions but nothing that is incapacitating.  She states that we "look at her crooked eyed"  And didn't take care of her needs. She demanded to speak to a psychiatrist.  I tried to explain to her that we consulted psychiatry for acute psychosis or when patients are a danger to themselves or others because of psychiatric condition.  She feels that she should always be evaluated when she feels anxious.  Patient was discharged once again.  Pertinent labs & imaging results that were available during my care of the patient were reviewed by me and considered in my medical decision making (see chart for details).  A medical screening exam was  performed and I feel the patient has had an appropriate workup for their chief complaint at this time and likelihood of emergent condition existing is low. They have been counseled on decision, discharge, follow up and which symptoms necessitate immediate return to the emergency department. They or their family verbally stated understanding and agreement with plan and discharged in stable condition.   ____________________________________________  FINAL CLINICAL IMPRESSION(S) / ED DIAGNOSES  Final diagnoses:  Nausea    MEDICATIONS GIVEN DURING THIS VISIT:  Medications  meclizine (ANTIVERT) tablet 12.5 mg (12.5 mg Oral Given 06/26/19 0402)    NEW OUTPATIENT MEDICATIONS STARTED DURING THIS VISIT:  Discharge Medication List as of 06/26/2019  4:52 AM    START taking these medications   Details  meclizine (ANTIVERT) 12.5 MG tablet Take 1 tablet (12.5 mg total) by mouth 3 (three) times daily as needed for dizziness., Starting Thu 06/26/2019, Print        Note:  This note was prepared with assistance of Dragon voice recognition software. Occasional wrong-word or sound-a-like substitutions may have  occurred due to the inherent limitations of voice recognition software.   Stratton Villwock, Barbara Cower, MD 06/26/19 [phone]

## 2019-06-26 NOTE — Patient Outreach (Signed)
CPSS will leave information for the patient for substance use recovery resources with the patient's nurse. Some of these substance use recovery resources include residential/outpatient substance use treatment center list, [facility] [place] vacancy list/flier detailing [place] [facility] services, and a [facility] online/in-person NA meeting list. CPSS will also include written instructions on how to follow up with [facility]PSS if needed for further help with getting connected to substance use recovery resources. Per TTS assessment, patient denied any current active substance use. Pet TTS assessment, patient last used cannabis and methamphetamines last week. UDS was not completed. EtOH is negative. CPSS has met with the patient twice in the past month on 06/19/19 and 05/30/19 where the patient did not want help with getting connected to substance use recovery resources.

## 2019-06-26 NOTE — ED Notes (Addendum)
1 gray necklace, 1 yellow necklace, 1 yellow bracelet with multi-colored stones, 1 yellow ring with clear stones, 1 scrunchie, 1 blue bra, 1 addidas jacket, 1 pair blue jean capria, 1 pair black rubber boots, 1 navy jacket, 1 black purse.   Pt dressed out by this RN and Shawna Orleans, EDT in burgundy scrubs.

## 2019-06-26 NOTE — BH Assessment (Signed)
[facility] Assessment Progress Note  Per Berneice Heinrich, FNP, this pt does not require psychiatric hospitalization at this time.  Pt is to be discharged from [facility] with recommendation to follow up with [facility] [place].  This has been included in pt's discharge instructions, along with information regarding area supportive services for the homeless.  Pt's nurse has been notified.  Doylene Canning, MA Triage Specialist [phone]

## 2019-06-26 NOTE — ED Triage Notes (Signed)
Patient arrived to ED c/o N/V and states that she is [redacted] weeks pregnant. POC pregancy test is negative.

## 2019-06-26 NOTE — [facility] Suicide Risk Assessment (Cosign Needed)
Suicide Risk Assessment  Discharge Assessment   [facility] Discharge Suicide Risk Assessment   Principal Problem: Substance induced mood disorder (HCC) Discharge Diagnoses: Principal Problem:   Substance induced mood disorder (HCC)   Total Time spent with patient: 30 minutes  Musculoskeletal: Strength & Muscle Tone: within normal limits Gait & Station: normal Patient leans: [name]  Psychiatric Specialty Exam:   Blood pressure (!) 101/50, pulse 71, temperature 98.5 F (36.9 C), temperature source Oral, resp. rate 15, height 6\' 1"  (1.854 m), weight 72.6 kg, last menstrual period 06/18/2019, SpO2 97 %, unknown if currently breastfeeding.Body mass index is 21.11 kg/m.  General Appearance: Casual  Eye Contact::  Good  Speech:  Clear and Coherent and Normal Rate409  Volume:  Normal  Mood:  Anxious  Affect:  Appropriate and Congruent  Thought Process:  Coherent, Goal Directed and Descriptions of Associations: Intact  Orientation:  Full (Time, Place, and Person)  Thought Content:  WDL and Logical  Suicidal Thoughts:  No  Homicidal Thoughts:  No  Memory:  Immediate;   Good Recent;   Good Remote;   Good  Judgement:  Fair  Insight:  Fair  Psychomotor Activity:  Normal  Concentration:  Good  Recall:  Good  Fund of Knowledge:Good  Language: Good  Akathisia:  No  Handed:  Right  AIMS (if indicated):     Assets:  Communication Skills Desire for Improvement Financial Resources/Insurance Housing Physical Health Resilience Social Support  Sleep:     Cognition: WNL  ADL's:  Intact   Mental Status Per Nursing Assessment::   On Admission:    Patient assessed by nurse practitioner.  Patient states "I came to the ER because I feel weak and tired." Patient denies suicidal and homicidal ideations.  Patient denies auditory and visual hallucinations. Patient denies symptoms of paranoia. Patient reports "I cannot go home to my mothers house because her boyfriend has a 50-B order out on me."  Patient requests housing resources.  Case discussed with Dr [phone], discharge after peer support with outpatient housing resources.  Demographic Factors:  Adolescent or young adult  Loss Factors: NA  Historical Factors: NA  Risk Reduction Factors:   Living with another person, especially a relative, Positive social support, Positive therapeutic relationship and Positive coping skills or problem solving skills  Continued Clinical Symptoms:  Alcohol/Substance Abuse/Dependencies  Cognitive Features That Contribute To Risk:  None    Suicide Risk:  Minimal: No identifiable suicidal ideation.  Patients presenting with no risk factors but with morbid ruminations; may be classified as minimal risk based on the severity of the depressive symptoms    Plan Of Care/Follow-up recommendations:  Other:  Follow up with substance use resources  Lucianne Muss, FNP 06/26/2019, 10:10 AM

## 2019-06-27 LAB — POCT PREGNANCY, URINE: Preg Test, Ur: NEGATIVE

## 2019-06-27 NOTE — Consult Note (Signed)
[place] Face-to-Face Psychiatry Consult   Reason for Consult: Psychiatric Evaluation Referring Physician: Dr. 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 female patient Presented to [facility] ED via POV voluntary.  The patient was seen at [facility] and [facility] in [place], where she did not meet psychiatric inpatient admission criteria. The patient had to be escorted off campus by security because she became angry, irritable, and upset after being told she did not meet the psychiatric inpatient admission criteria.  Per the ED triage nursing note, the patient presents with nausea and vomiting complaints and states that she is [redacted] weeks pregnant.  The patient pregnancy test results were negative.  It was reported that the patient also presented with increased anxiety and voicing suicidal ideations without a plan.   The patient was seen face-to-face by this provider; chart reviewed and consulted with Dr. Roxan Hockey on 06/26/2019 due to the patient's  care. It was discussed with the EDP that the patient would be under observation overnight and reassess in the a.m. to determine if she meets criteria for psychiatric inpatient admission or be discharged back home.  She is alert and oriented x 3, resting calmly but uncooperative, and mood-congruent with affect.  The patient does not appear to be responding to internal or external stimuli. Neither is the patient presenting with any delusional thinking. The patient denies auditory or visual hallucinations. The patient admits to suicidal ideation but without a plan.  She denies homicidal or self-harm ideations. The patient is not presenting with any psychotic or paranoid behaviors. During an encounter with the patient, she was able to answer questions appropriately.  Plan: The patient will be under observation overnight and reassess in the a.m. to determine if she meets criteria for psychiatric inpatient admission or she could be discharged back home  HPI: Per Dr. Roxan Hockey; Amy [name] is a 24 y.o. female below listed past medical history presents the ER due to concern that "metal bodies are preventing her from putting trauma back in office.  ".  States she is also having suicidal ideations.  Very difficult to follow her train of thought.  Review of the record she is been seen to other facilities twice in the past 24 hours.  Denies any nausea or vomiting no pain.  Past Psychiatric History:  Anxiety Bipolar disorder (HCC) Cannabis abuse Depression Methamphetamine abuse (HCC) Schizo affective schizophrenia (HCC)  Risk to Self: Suicidal Ideation: Yes-Currently Present Suicidal Intent: No Is patient at risk for suicide?: No Suicidal Plan?: No-Not Currently/Within Last 6 Months Access to Means: No What  has been your use of drugs/alcohol within the last 12 months?: Cannabis and Meth How many times?: 1 Other Self Harm Risks: Substance Abuse Triggers for Past Attempts: None known Intentional Self  Injurious Behavior: None Risk to Others: Homicidal Ideation: No Thoughts of Harm to Others: No Current Homicidal Intent: No Current Homicidal Plan: No Access to Homicidal Means: No History of harm to others?: No Assessment of Violence: None Noted Does patient have access to weapons?: No Criminal Charges Pending?: No Does patient have a court date: No Prior Inpatient Therapy: Prior Inpatient Therapy: Yes Prior Therapy Dates: multiple Prior Therapy Facilty/Provider(s): [facility] Reason for Treatment: MH Prior Outpatient Therapy: Prior Outpatient Therapy: No Prior Therapy Dates: unknown Prior Therapy Facilty/Provider(s): [facility] Reason for Treatment: Medication management Does patient have an ACCT team?: No Does patient have Intensive In-House Services?  : No Does patient have [facility] services? : No Does patient have P4CC services?: No  Past Medical History:  Past Medical History:  Diagnosis Date  . Anxiety   . Asthma   . Bipolar disorder (HCC)   . Cannabis abuse   . Depression   . 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 Schizo affective schizophrenia (HCC)    History reviewed. No pertinent surgical history. Family History:  Family History  Problem Relation Age of Onset  . Mental illness Neg Hx    Family Psychiatric  History:  Social History:  Social History   Substance and Sexual Activity  Alcohol Use Not Currently     Social History   Substance and Sexual Activity  Drug Use Yes  . Types: Marijuana    Social History   Socioeconomic History  . Marital status: Single    Spouse name: Not on file  . Number of children: Not on file  . Years of education: 7112  . Highest education level: 12th grade  Occupational History  . Occupation: Unemployed  Tobacco Use  . Smoking status: Current Some Day Smoker    Packs/day: 0.50    Types: Cigarettes  . Smokeless tobacco: Never Used  Substance and Sexual Activity  . Alcohol use: Not Currently  . Drug use: Yes    Types: Marijuana   . Sexual activity: Not Currently    Birth control/protection: None  Other Topics Concern  . Not on file  Social History Narrative   Pt is unemployed.  She stated that she lives in [place].  Due to altered mental status, difficult to obtain history.  Per previous history, Pt lives with her parents.   Social Determinants of Health   Financial Resource Strain:   . Difficulty of Paying Living Expenses: Not on file  Food Insecurity:   . Worried About Programme researcher, broadcasting/film/videounning Out of Food in the Last Year: Not on file  . Ran Out of Food in the Last Year: Not on file  Transportation Needs:   . Lack of Transportation (Medical): Not on file  . Lack of Transportation (Non-Medical): Not on file  Physical Activity:   . Days of Exercise per Week: Not on file  . Minutes of Exercise per Session: Not on file  Stress:   . Feeling of Stress : Not on file  Social Connections:   . Frequency of Communication with Friends and Family: Not on file  . Frequency of Social Gatherings with Friends and Family: Not on file  . Attends Religious Services: Not on file  . Active Member of Clubs or Organizations: Not on file  . Attends BankerClub or Organization Meetings: Not on  file  . Marital Status: Not on file   Additional Social History:    Allergies:   Allergies  Allergen Reactions  . Penicillins Rash    Has patient had a PCN reaction causing immediate rash, facial/tongue/throat swelling, SOB or lightheadedness with hypotension: Yes Has patient had a PCN reaction causing severe rash involving mucus membranes or skin necrosis: No Has patient had a PCN reaction that required hospitalization: No Has patient had a PCN reaction occurring within the last 10 years: Yes If all of the above answers are "NO", then may   . Cogentin [Benztropine] Itching  . Divalproex Sodium Other (See Comments)    Creates feelings of paranoia, some suicidal feelings, and makes her feel that "people are coming after" her Creates feelings of  paranoia, some suicidal feelings, and makes her feel that "people are coming after" her  . Risperidone Other (See Comments)    "Makes me cough"  . Valproic Acid Other (See Comments)    Creates paranoia/Per [facility] Creates paranoia/Per [facility]    Labs:  Results for orders placed or performed during the hospital encounter of 06/26/19 (from the past 48 hour(s))  Urine Drug Screen, Qualitative     Status: Abnormal   Collection Time: 06/26/19  6:00 PM  Result Value Ref Range   Tricyclic, Ur Screen NONE DETECTED NONE DETECTED   Amphetamines, Ur Screen NONE DETECTED NONE DETECTED   MDMA (Ecstasy)Ur Screen NONE DETECTED NONE DETECTED   Cocaine Metabolite,Ur [place] NONE DETECTED NONE DETECTED   Opiate, Ur Screen NONE DETECTED NONE DETECTED   Phencyclidine (PCP) Ur S NONE DETECTED NONE DETECTED   Cannabinoid 50 Ng, Ur [place] POSITIVE (A) NONE DETECTED   Barbiturates, Ur Screen NONE DETECTED NONE DETECTED   Benzodiazepine, Ur Scrn NONE DETECTED NONE DETECTED   Methadone Scn, Ur NONE DETECTED NONE DETECTED    Comment: (NOTE) Tricyclics + metabolites, urine    Cutoff 1000 ng/mL Amphetamines + metabolites, urine  Cutoff 1000 ng/mL MDMA (Ecstasy), urine              Cutoff 500 ng/mL Cocaine Metabolite, urine          Cutoff 300 ng/mL Opiate + metabolites, urine        Cutoff 300 ng/mL Phencyclidine (PCP), urine         Cutoff 25 ng/mL Cannabinoid, urine                 Cutoff 50 ng/mL Barbiturates + metabolites, urine  Cutoff 200 ng/mL Benzodiazepine, urine              Cutoff 200 ng/mL Methadone, urine                   Cutoff 300 ng/mL The urine drug screen provides only a preliminary, unconfirmed analytical test result and should not be used for non-medical purposes. Clinical consideration and professional judgment should be applied to any positive drug screen result due to possible interfering substances. A more specific alternate chemical method must be used in order to obtain a  confirmed analytical result. Gas chromatography / mass spectrometry (GC/MS) is the preferred confirmat ory method. Performed at [facility], [address] [place], [place] [address]   Comprehensive metabolic panel     Status: Abnormal   Collection Time: 06/26/19  6:05 PM  Result Value Ref Range   Sodium 139 135 - 145 mmol/L   Potassium 3.8 3.5 - 5.1 mmol/L   Chloride 105 98 -  111 mmol/L   CO2 27 22 - 32 mmol/L   Glucose, Bld 87 70 - 99 mg/dL   BUN 7 6 - 20 mg/dL   Creatinine, Ser 5.53 0.44 - 1.00 mg/dL   Calcium 8.7 (L) 8.9 - 10.3 mg/dL   Total Protein 6.7 6.5 - 8.1 g/dL   Albumin 3.6 3.5 - 5.0 g/dL   AST 20 15 - 41 U/L   ALT 20 0 - 44 U/L   Alkaline Phosphatase 42 38 - 126 U/L   Total Bilirubin 0.5 0.3 - 1.2 mg/dL   GFR calc non Af Amer >60 >60 mL/min   GFR calc Af Amer >60 >60 mL/min   Anion gap 7 5 - 15    Comment: Performed at [facility], [address], [place], [place] [address]  Ethanol     Status: None   Collection Time: 06/26/19  6:05 PM  Result Value Ref Range   Alcohol, Ethyl (B) <10 <10 mg/dL    Comment: (NOTE) Lowest detectable limit for serum alcohol is 10 mg/dL. For medical purposes only. Performed at [facility], [address] [place], [place] [address]   Salicylate level     Status: Abnormal   Collection Time: 06/26/19  6:05 PM  Result Value Ref Range   Salicylate Lvl <7.0 (L) 7.0 - 30.0 mg/dL    Comment: Performed at [facility], [address] [place], [place] [address]  Acetaminophen level     Status: Abnormal   Collection Time: 06/26/19  6:05 PM  Result Value Ref Range   Acetaminophen (Tylenol), Serum <10 (L) 10 - 30 ug/mL    Comment: (NOTE) Therapeutic concentrations vary significantly. A range of 10-30 ug/mL  may be an effective concentration for many patients. However, some  are best treated at concentrations outside of this range. Acetaminophen concentrations >150 ug/mL at 4 hours after  ingestion  and >50 ug/mL at 12 hours after ingestion are often associated with  toxic reactions. Performed at [facility], [address] [place], [place] [address]   cbc     Status: Abnormal   Collection Time: 06/26/19  6:05 PM  Result Value Ref Range   WBC 4.6 4.0 - 10.5 K/uL   RBC 3.76 (L) 3.87 - 5.11 MIL/uL   Hemoglobin 12.1 12.0 - 15.0 g/dL   HCT 12.1 97.5 - 88.3 %   MCV 98.7 80.0 - 100.0 fL   MCH 32.2 26.0 - 34.0 pg   MCHC 32.6 30.0 - 36.0 g/dL   RDW 25.4 98.2 - 64.1 %   Platelets 241 150 - 400 K/uL   nRBC 0.0 0.0 - 0.2 %    Comment: Performed at [facility], [address] [place], [place] [address]  Respiratory Panel by RT PCR (Flu A&B, Covid) - Nasopharyngeal Swab     Status: None   Collection Time: 06/26/19  7:29 PM   Specimen: Nasopharyngeal Swab  Result Value Ref Range   SARS Coronavirus 2 by RT PCR NEGATIVE NEGATIVE    Comment: (NOTE) SARS-CoV-2 target nucleic acids are NOT DETECTED. The SARS-CoV-2 RNA is generally detectable in upper respiratoy specimens during the acute phase of infection. The lowest concentration of SARS-CoV-2 viral copies this assay can detect is 131 copies/mL. A negative result does not preclude SARS-Cov-2 infection and should not be used as the sole basis for treatment or other patient management decisions. A negative result may occur with  improper specimen collection/handling, submission of specimen other than nasopharyngeal  swab, presence of viral mutation(s) within the areas targeted by this assay, and inadequate number of viral copies (<131 copies/mL). A negative result must be combined with clinical observations, patient history, and epidemiological information. The expected result is Negative. Fact Sheet for Patients:  PinkCheek.be Fact Sheet for Healthcare Providers:  GravelBags.it This test is not yet ap proved or cleared by the [place] FDA and   has been authorized for detection and/or diagnosis of SARS-CoV-2 by FDA under an Emergency Use Authorization (EUA). This EUA will remain  in effect (meaning this test can be used) for the duration of the COVID-19 declaration under Section 564(b)(1) of the Act, 21 U.S.C. section 360bbb-3(b)(1), unless the authorization is terminated or revoked sooner.    Influenza A by PCR NEGATIVE NEGATIVE   Influenza B by PCR NEGATIVE NEGATIVE    Comment: (NOTE) The Xpert Xpress SARS-CoV-2/FLU/RSV assay is intended as an aid in  the diagnosis of influenza from Nasopharyngeal swab specimens and  should not be used as a sole basis for treatment. Nasal washings and  aspirates are unacceptable for Xpert Xpress SARS-CoV-2/FLU/RSV  testing. Fact Sheet for Patients: PinkCheek.be Fact Sheet for Healthcare Providers: GravelBags.it This test is not yet approved or cleared by the [place] FDA and  has been authorized for detection and/or diagnosis of SARS-CoV-2 by  FDA under an Emergency Use Authorization (EUA). This EUA will remain  in effect (meaning this test can be used) for the duration of the  Covid-19 declaration under Section 564(b)(1) of the Act, 21  U.S.C. section 360bbb-3(b)(1), unless the authorization is  terminated or revoked. Performed at [facility], [address] [place], [place] [address]     No current facility-administered medications for this encounter.   Current Outpatient Medications  Medication Sig Dispense Refill  . meclizine (ANTIVERT) 12.5 MG tablet Take 1 tablet (12.5 mg total) by mouth 3 (three) times daily as needed for dizziness. 30 tablet 0    Musculoskeletal: Strength & Muscle Tone: within normal limits Gait & Station: normal Patient leans: Right and [name]  Psychiatric Specialty Exam: Physical Exam  Nursing note and vitals reviewed. Constitutional: She is oriented to person, place, and time.  She appears well-developed.  Cardiovascular: Normal rate.  Respiratory: Effort normal.  Musculoskeletal:        General: Normal range of motion.     Cervical back: Normal range of motion and neck supple.  Neurological: She is alert and oriented to person, place, and time.  Psychiatric: Her behavior is normal.    Review of Systems  Psychiatric/Behavioral: Positive for suicidal ideas. The patient is nervous/anxious.   All other systems reviewed and are negative.   Blood pressure 112/72, pulse 71, temperature 99.1 F (37.3 C), temperature source Oral, resp. rate 16, height 6\' 1"  (1.854 m), weight 72.6 kg, last menstrual period 06/18/2019, SpO2 100 %, unknown if currently breastfeeding.Body mass index is 21.11 kg/m.  General Appearance: Casual  Eye Contact:  Minimal  Speech:  Clear and Coherent  Volume:  Decreased  Mood:  Depressed  Affect:  Congruent  Thought Process:  Coherent  Orientation:  Full (Time, Place, and Person)  Thought Content:  Logical  Suicidal Thoughts:  Yes.  without intent/plan  Homicidal Thoughts:  No  Memory:  Immediate;   Good Recent;   Good Remote;   Good  Judgement:  Fair  Insight:  Lacking  Psychomotor Activity:  Normal  Concentration:  Concentration: Good and Attention Span: Good  Recall:  Good  Fund  of Knowledge:  Fair  Language:  Good  Akathisia:  Negative  Handed:  Right  AIMS (if indicated):     Assets:  Communication Skills Desire for Improvement Housing Social Support  ADL's:  Intact  Cognition:  WNL  Sleep:   Good     Treatment Plan Summary: Daily contact with patient to assess and evaluate symptoms and progress in treatment and Plan The patient will remain under observation overnight and reassess in the a.m. to determine if she meets criteria for psychiatric inpatient admission or could be discharged back to the community.  Disposition: Supportive therapy provided about ongoing stressors. The patient will remain under observation  overnight and reassess in the a.m. to determine if she meets criteria for psychiatric inpatient admission or could be discharged back to the community.  Gillermo Murdoch, NP 06/27/2019 2:39 AM

## 2019-06-27 NOTE — ED Provider Notes (Signed)
-----------------------------------------   5:19 AM on 06/27/2019 -----------------------------------------   Blood pressure 112/72, pulse 71, temperature 99.1 F (37.3 C), temperature source Oral, resp. rate 16, height 1.854 m (6\' 1" ), weight 72.6 kg, last menstrual period 06/18/2019, SpO2 100 %, unknown if currently breastfeeding.  The patient will be reassessed by psychiatry in the morning to determine definitive disposition.  Awaiting disposition plan from Behavioral Medicine and/or Social Work team(s).   06/20/2019, MD 06/27/19 [phone]

## 2019-06-27 NOTE — BH Assessment (Signed)
Assessment Note  Amy [name] is an 24 y.o. female presenting to [facility] ED. Patient had been to both [facility] ED and [facility] ED over the course of 24 hours now. Patient arrived to [facility] ED at 1:02am on 06/26/19, per triage note patient arrived to ED c/o N/V and states that she is [redacted] weeks pregnant. POC pregancy test is negative. Patient was seen by the EDP for nausea and weakness. Patient arrived to [facility] ED at 6:38am on 06/26/19, per triage note Pt arriving stating she "just doesn't feel good." Pt reports increased anxiety and suicidal ideations without plan. Patient was seen and assessed and asked if she felt like she needed to go to behavioral health or assistance from psychiatry and patient reported "no." Patient did not meet inpatient hospitalization criteria and was cleared for discharge. Patient was then discharged from [facility] ED with recommendation to follow up with [facility] [place], patient was discharged at 1:21pm. Patient arrived to [facility] ED at 06/26/19 at 5:55pm, per triage note Pt presents to ED via POV for psychiatric evaluation. Pt states "I'm having thoughts about killing myself". Pt states "I don't feel good, not like covid, I just feel sad and depressed, just found out all the new information in this world". Pt noted to be manic in triage, noted to be somewhat paranoid, states someone robotic "or not real" is following her. When asked about plan pt states "I mean it's not like that, you know not like no [place] or nothing".Pt seen at [facility] ED and [facility] ED earlier today, seen at [facility] ED for same. During assessment patient appeared tired and drowsy and did not want to participate much in her assessment, when asked why she was presenting to ED she reported "I'm having suicidal thoughts." When asked if patient had a plan she reported "no not yet." Patient denied HI/AH/VH. Patient reported that she is not currently seeing a psychiatrist and reported that she was recently hospitalized by [facility] "1 week ago I felt  depressed."   Per Psyc NP patient will be observed overnight and reassessed in the morning.   Diagnosis: Bipolar Disorder by history   Past Medical History:  Past Medical History:  Diagnosis Date  . Anxiety   . Asthma   . Bipolar disorder ([place])   . Cannabis abuse   . Depression   . Methamphetamine abuse ([place])   . Schizo affective schizophrenia ([place])     History reviewed. No pertinent surgical history.  Family History:  Family History  Problem Relation Age of Onset  . Mental illness Neg Hx     Social History:  reports that she has been smoking cigarettes. She has been smoking about 0.50 packs per day. She has never used smokeless tobacco. She reports previous alcohol use. She reports current drug use. Drug: Marijuana.  Additional Social History:  Alcohol / Drug Use Pain Medications: See MAR Prescriptions: See MAR Over the Counter: See MAR History of alcohol / drug use?: Yes Substance #1 Name of Substance 1: Marijuana  CIWA: CIWA-Ar BP: 112/72 Pulse Rate: 71 COWS:    Allergies:  Allergies  Allergen Reactions  . Penicillins Rash    Has patient had a PCN reaction causing immediate rash, facial/tongue/throat swelling, SOB or lightheadedness with hypotension: Yes Has patient had a PCN reaction causing severe rash involving mucus membranes or skin necrosis: No Has patient had a PCN reaction that required hospitalization: No Has patient had a PCN reaction occurring within the last 10 years: Yes  If all of the above answers are "NO", then may   . Cogentin [Benztropine] Itching  . Divalproex Sodium Other (See Comments)    Creates feelings of paranoia, some suicidal feelings, and makes her feel that "people are coming after" her Creates feelings of paranoia, some suicidal feelings, and makes her feel that "people are coming after" her  . Risperidone Other (See Comments)    "Makes me cough"  . Valproic Acid Other (See Comments)    Creates paranoia/Per [facility] Creates paranoia/Per [facility]    Home Medications: (Not in a hospital admission)   OB/GYN Status:  Patient's last menstrual period was 06/18/2019 (approximate).  General Assessment Data Location of Assessment: [facility] ED TTS Assessment: In system Is this a Tele or Face-to-Face Assessment?: Face-to-Face Is this an Initial Assessment or a Re-assessment for this encounter?: Initial Assessment Patient Accompanied by:: [name] Language Other than English: No Living Arrangements: Other (Comment) What gender do you identify as?: Female Marital status: Single Maiden name: Weimann Pregnancy Status: Unknown Living Arrangements: Other (Comment) Can pt return to current living arrangement?: Yes Admission Status: Voluntary Is patient capable of signing voluntary admission?: Yes Referral Source: Self/Family/Friend Insurance type: None  Medical Screening Exam [facility] Walk-in ONLY) Medical Exam completed: Yes  Crisis Care Plan Living Arrangements: Other (Comment) Legal Guardian: Other:(Self) Name of Psychiatrist: none Name of Therapist: none  Education Status Is patient currently in school?: No Is the patient employed, unemployed or receiving disability?: Unemployed  Risk to self with the past 6 months Suicidal Ideation: Yes-Currently Present Has patient been a risk to self within the past 6 months prior to admission? : No Suicidal Intent: No Has patient had any suicidal intent within the past 6 months prior to admission? : Yes Is patient at risk for suicide?: No Suicidal Plan?: No-Not Currently/Within Last 6 Months Has patient had any suicidal plan within the past 6 months prior to admission? : No Access to Means: No What has been your use of drugs/alcohol within the last 12 months?: Cannabis and Meth Previous Attempts/Gestures: Yes How many times?: 1 Other Self Harm Risks: Substance Abuse Triggers for Past Attempts: None known Intentional Self Injurious Behavior:  None Family Suicide History: No Recent stressful life event(s): Conflict (Comment) Persecutory voices/beliefs?: No Depression: Yes Depression Symptoms: Isolating, Loss of interest in usual pleasures Substance abuse history and/or treatment for substance abuse?: Yes Suicide prevention information given to non-admitted patients: Not applicable  Risk to Others within the past 6 months Homicidal Ideation: No Does patient have any lifetime risk of violence toward others beyond the six months prior to admission? : No Thoughts of Harm to Others: No Current Homicidal Intent: No Current Homicidal Plan: No Access to Homicidal Means: No History of harm to others?: No Assessment of Violence: None Noted Does patient have access to weapons?: No Criminal Charges Pending?: No Does patient have a court date: No Is patient on probation?: No  Psychosis Hallucinations: None noted Delusions: None noted  Mental Status Report Appearance/Hygiene: In scrubs Eye Contact: Poor Motor Activity: Freedom of movement Speech: Logical/coherent Level of Consciousness: Quiet/awake, Drowsy Mood: Irritable Affect: Appropriate to circumstance Anxiety Level: Minimal Thought Processes: Coherent, Relevant Judgement: Unimpaired Orientation: Person, Place, Time, Situation, Appropriate for developmental age Obsessive Compulsive Thoughts/Behaviors: None  Cognitive Functioning Concentration: Fair Memory: Recent Intact, Remote Intact Is patient IDD: No Insight: Poor Impulse Control: Fair Appetite: Good Have you had any weight changes? : No Change Sleep: No Change Total Hours of Sleep: 7  Vegetative Symptoms: None  ADLScreening [facility] Assessment Services) Patient's cognitive ability adequate to safely complete daily activities?: Yes Patient able to express need for assistance with ADLs?: Yes Independently performs ADLs?: Yes (appropriate for developmental age)  Prior Inpatient Therapy Prior Inpatient Therapy:  Yes Prior Therapy Dates: multiple Prior Therapy Facilty/Provider(s): [facility] Reason for Treatment: MH  Prior Outpatient Therapy Prior Outpatient Therapy: No Prior Therapy Dates: unknown Prior Therapy Facilty/Provider(s): [facility] Reason for Treatment: Medication management Does patient have an ACCT team?: No Does patient have Intensive In-House Services?  : No Does patient have [facility] services? : No Does patient have P4CC services?: No  ADL Screening (condition at time of admission) Patient's cognitive ability adequate to safely complete daily activities?: Yes Is the patient deaf or have difficulty hearing?: No Does the patient have difficulty seeing, even when wearing glasses/contacts?: No Does the patient have difficulty concentrating, remembering, or making decisions?: No Patient able to express need for assistance with ADLs?: Yes Does the patient have difficulty dressing or bathing?: No Independently performs ADLs?: Yes (appropriate for developmental age) Does the patient have difficulty walking or climbing stairs?: No Weakness of Legs: None Weakness of Arms/Hands: None  Home Assistive Devices/Equipment Home Assistive Devices/Equipment: None  Therapy Consults (therapy consults require a physician order) PT Evaluation Needed: No OT Evalulation Needed: No SLP Evaluation Needed: No Abuse/Neglect Assessment (Assessment to be complete while patient is alone) Abuse/Neglect Assessment Can Be Completed: Yes Physical Abuse: Denies Verbal Abuse: Denies Sexual Abuse: Denies Exploitation of patient/patient's resources: Denies Self-Neglect: Denies Values / Beliefs Cultural Requests During Hospitalization: None Spiritual Requests During Hospitalization: None Consults Spiritual Care Consult Needed: No Transition of Care Team Consult Needed: No Advance Directives (For Healthcare) Does Patient Have a Medical Advance Directive?: No Would patient like information on creating a medical  advance directive?: No - Patient declined          Disposition: Per Psyc NP patient will be observed overnight and reassessed in the morning. Disposition Initial Assessment Completed for this Encounter: Yes Disposition of Patient: (Observe overnight and reassess in the morning)  On Site Evaluation by:   Reviewed with Physician:    Benay Pike MS LCASA 06/27/2019 1:20 AM

## 2019-06-27 NOTE — ED Provider Notes (Signed)
-----------------------------------------   10:45 AM on 06/27/2019 -----------------------------------------  Per Dr. Cindi Carbon, the patient has been cleared for discharge.  Return precautions provided.   Dionne Bucy, MD 06/27/19 1046

## 2019-06-27 NOTE — Consult Note (Signed)
[facility] Face-to-Face Psychiatry Consult   Reason for Consult: Psychiatric Evaluation Referring Physician: Dr. 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  She reports feeling much better from last night.  She explains her presentation is due to being upset regarding her living status.  She is currently homeless and traveling between [place] and [place].  She reports having family in the area but due to interpersonal difficulties is unable to live with them at this time.  She denies any psychiatric symptoms or serious psychiatric diagnoses but does except resources for outpatient care.  Patient last night had presented with suicidal ideation, she was asked again today but adamantly denied feeling suicidal.  Patient is future oriented and is looking forward to finding a place to live and reestablishing relationships with her family.  Disposition: Discharge   NP note as follows "  Total Time spent with patient: 30 minutes  Subjective: "I want to hurt myself." Amy [name] is a 24 y.o. female patient Presented to [facility] ED via POV  voluntary.  The patient was seen at [facility] in [place], where she did not meet psychiatric inpatient admission criteria. The patient had to be escorted off campus by security because she became angry, irritable, and upset after being told she did not meet the psychiatric inpatient admission criteria.  Per the ED triage nursing note, the patient presents with nausea and vomiting complaints and states that she is [redacted] weeks pregnant.  The patient pregnancy test results were negative.  It was reported that the patient also presented with increased anxiety and voicing suicidal ideations without a plan.   The patient was seen face-to-face by this provider; chart reviewed and consulted with Dr. Roxan Hockey on 06/26/2019 due to the patient's care. It was discussed with the EDP that the patient would be under observation overnight and reassess in the a.m. to determine if she meets criteria for psychiatric inpatient admission or be discharged back home.  She is alert and oriented x 3, resting calmly but uncooperative, and mood-congruent with affect.  The patient does not appear to be responding to internal or external stimuli. Neither is the patient presenting with any delusional thinking. The patient denies auditory or visual hallucinations. The patient admits to suicidal ideation but without a plan.  She denies homicidal or self-harm ideations. The patient is not presenting with any psychotic or paranoid behaviors. During an encounter with the patient, she was able to answer questions appropriately.  Plan: The patient will be under observation overnight and reassess in the a.m. to determine if she meets criteria for psychiatric inpatient admission or she could be discharged back  home  HPI: Per Dr. Roxan Hockey; Amy [name] is a 24 y.o. female below listed past medical history presents the ER due to concern that "metal bodies are preventing her from putting trauma back  in office.  ".  States she is also having suicidal ideations.  Very difficult to follow her train of thought.  Review of the record she is been seen to other facilities twice in the past 24 hours.  Denies any nausea or vomiting no pain.  Past Psychiatric History:  Anxiety Bipolar disorder (HCC) Cannabis abuse Depression Methamphetamine abuse (HCC) Schizo affective schizophrenia (HCC)  Risk to Self: Suicidal Ideation: Yes-Currently Present Suicidal Intent: No Is patient at risk for suicide?: No Suicidal Plan?: No-Not Currently/Within Last 6 Months Access to Means: No What has been your use of drugs/alcohol within the last 12 months?: Cannabis and Meth How many times?: 1 Other Self Harm Risks: Substance Abuse Triggers for Past Attempts: None known Intentional Self Injurious Behavior: None Risk to Others: Homicidal Ideation: No Thoughts of Harm to Others: No Current Homicidal Intent: No Current Homicidal Plan: No Access to Homicidal Means: No History of harm to others?: No Assessment of Violence: None Noted Does patient have access to weapons?: No Criminal Charges Pending?: No Does patient have a court date: No Prior Inpatient Therapy: Prior Inpatient Therapy: Yes Prior Therapy Dates: multiple Prior Therapy Facilty/Provider(s): [facility] Reason for Treatment: MH Prior Outpatient Therapy: Prior Outpatient Therapy: No Prior Therapy Dates: unknown Prior Therapy Facilty/Provider(s): [facility] Reason for Treatment: Medication management Does patient have an ACCT team?: No Does patient have Intensive In-House Services?  : No Does patient have [facility] services? : No Does patient have P4CC services?: No  Past Medical History:  Past Medical History:  Diagnosis Date  . Anxiety   . Asthma   . Bipolar disorder (HCC)   . Cannabis abuse   . Depression   . 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 Schizo affective schizophrenia (HCC)    History reviewed. No pertinent surgical history. Family  History:  Family History  Problem Relation Age of Onset  . Mental illness Neg Hx    Family Psychiatric  History:  Social History:  Social History   Substance and Sexual Activity  Alcohol Use Not Currently     Social History   Substance and Sexual Activity  Drug Use Yes  . Types: Marijuana    Social History   Socioeconomic History  . Marital status: Single    Spouse name: Not on file  . Number of children: Not on file  . Years of education: 82  . Highest education level: 12th grade  Occupational History  . Occupation: Unemployed  Tobacco Use  . Smoking status: Current Some Day Smoker    Packs/day: 0.50    Types: Cigarettes  . Smokeless tobacco: Never Used  Substance and Sexual Activity  . Alcohol use: Not Currently  . Drug use: Yes    Types: Marijuana  . Sexual activity: Not Currently    Birth control/protection: None  Other Topics Concern  . Not on file  Social History Narrative   Pt is unemployed.  She stated that she lives in [place].  Due to altered mental status, difficult to obtain history.  Per previous history, Pt lives with her parents.   Social Determinants of Health   Financial Resource Strain:   . Difficulty of Paying Living Expenses: Not on file  Food Insecurity:   . Worried About Programme researcher, broadcasting/film/video in the Last Year:  Not on file  . Ran Out of Food in the Last Year: Not on file  Transportation Needs:   . Lack of Transportation (Medical): Not on file  . Lack of Transportation (Non-Medical): Not on file  Physical Activity:   . Days of Exercise per Week: Not on file  . Minutes of Exercise per Session: Not on file  Stress:   . Feeling of Stress : Not on file  Social Connections:   . Frequency of Communication with Friends and Family: Not on file  . Frequency of Social Gatherings with Friends and Family: Not on file  . Attends Religious Services: Not on file  . Active Member of Clubs or Organizations: Not on file  . Attends Tax inspector Meetings: Not on file  . Marital Status: Not on file   Additional Social History:    Allergies:   Allergies  Allergen Reactions  . Penicillins Rash    Has patient had a PCN reaction causing immediate rash, facial/tongue/throat swelling, SOB or lightheadedness with hypotension: Yes Has patient had a PCN reaction causing severe rash involving mucus membranes or skin necrosis: No Has patient had a PCN reaction that required hospitalization: No Has patient had a PCN reaction occurring within the last 10 years: Yes If all of the above answers are "NO", then may   . Cogentin [Benztropine] Itching  . Divalproex Sodium Other (See Comments)    Creates feelings of paranoia, some suicidal feelings, and makes her feel that "people are coming after" her Creates feelings of paranoia, some suicidal feelings, and makes her feel that "people are coming after" her  . Risperidone Other (See Comments)    "Makes me cough"  . Valproic Acid Other (See Comments)    Creates paranoia/Per [facility] Creates paranoia/Per [facility]    Labs:  Results for orders placed or performed during the hospital encounter of 06/26/19 (from the past 48 hour(s))  Urine Drug Screen, Qualitative     Status: Abnormal   Collection Time: 06/26/19  6:00 PM  Result Value Ref Range   Tricyclic, Ur Screen NONE DETECTED NONE DETECTED   Amphetamines, Ur Screen NONE DETECTED NONE DETECTED   MDMA (Ecstasy)Ur Screen NONE DETECTED NONE DETECTED   Cocaine Metabolite,Ur [place] NONE DETECTED NONE DETECTED   Opiate, Ur Screen NONE DETECTED NONE DETECTED   Phencyclidine (PCP) Ur S NONE DETECTED NONE DETECTED   Cannabinoid 50 Ng, Ur [place] POSITIVE (A) NONE DETECTED   Barbiturates, Ur Screen NONE DETECTED NONE DETECTED   Benzodiazepine, Ur Scrn NONE DETECTED NONE DETECTED   Methadone Scn, Ur NONE DETECTED NONE DETECTED    Comment: (NOTE) Tricyclics + metabolites, urine    Cutoff 1000 ng/mL Amphetamines + metabolites,  urine  Cutoff 1000 ng/mL MDMA (Ecstasy), urine              Cutoff 500 ng/mL Cocaine Metabolite, urine          Cutoff 300 ng/mL Opiate + metabolites, urine        Cutoff 300 ng/mL Phencyclidine (PCP), urine         Cutoff 25 ng/mL Cannabinoid, urine                 Cutoff 50 ng/mL Barbiturates + metabolites, urine  Cutoff 200 ng/mL Benzodiazepine, urine              Cutoff 200 ng/mL Methadone, urine  Cutoff 300 ng/mL The urine drug screen provides only a preliminary, unconfirmed analytical test result and should not be used for non-medical purposes. Clinical consideration and professional judgment should be applied to any positive drug screen result due to possible interfering substances. A more specific alternate chemical method must be used in order to obtain a confirmed analytical result. Gas chromatography / mass spectrometry (GC/MS) is the preferred confirmat ory method. Performed at [facility], [address] [place], [place] [address]   Comprehensive metabolic panel     Status: Abnormal   Collection Time: 06/26/19  6:05 PM  Result Value Ref Range   Sodium 139 135 - 145 mmol/L   Potassium 3.8 3.5 - 5.1 mmol/L   Chloride 105 98 - 111 mmol/L   CO2 27 22 - 32 mmol/L   Glucose, Bld 87 70 - 99 mg/dL   BUN 7 6 - 20 mg/dL   Creatinine, Ser 8.290.69 0.44 - 1.00 mg/dL   Calcium 8.7 (L) 8.9 - 10.3 mg/dL   Total Protein 6.7 6.5 - 8.1 g/dL   Albumin 3.6 3.5 - 5.0 g/dL   AST 20 15 - 41 U/L   ALT 20 0 - 44 U/L   Alkaline Phosphatase 42 38 - 126 U/L   Total Bilirubin 0.5 0.3 - 1.2 mg/dL   GFR calc non Af Amer >60 >60 mL/min   GFR calc Af Amer >60 >60 mL/min   Anion gap 7 5 - 15    Comment: Performed at [facility], [address] [place], [place] [address]  Ethanol     Status: None   Collection Time: 06/26/19  6:05 PM  Result Value Ref Range   Alcohol, Ethyl (B) <10 <10 mg/dL    Comment: (NOTE) Lowest detectable limit for serum alcohol is 10  mg/dL. For medical purposes only. Performed at [facility], [address] [place], [place] [address]   Salicylate level     Status: Abnormal   Collection Time: 06/26/19  6:05 PM  Result Value Ref Range   Salicylate Lvl <7.0 (L) 7.0 - 30.0 mg/dL    Comment: Performed at [facility], [address] [place], [place] [address]  Acetaminophen level     Status: Abnormal   Collection Time: 06/26/19  6:05 PM  Result Value Ref Range   Acetaminophen (Tylenol), Serum <10 (L) 10 - 30 ug/mL    Comment: (NOTE) Therapeutic concentrations vary significantly. A range of 10-30 ug/mL  may be an effective concentration for many patients. However, some  are best treated at concentrations outside of this range. Acetaminophen concentrations >150 ug/mL at 4 hours after ingestion  and >50 ug/mL at 12 hours after ingestion are often associated with  toxic reactions. Performed at [facility], [address] [place], [place] [address]   cbc     Status: Abnormal   Collection Time: 06/26/19  6:05 PM  Result Value Ref Range   WBC 4.6 4.0 - 10.5 K/uL   RBC 3.76 (L) 3.87 - 5.11 MIL/uL   Hemoglobin 12.1 12.0 - 15.0 g/dL   HCT 41.337.1 24.436.0 - 01.046.0 %   MCV 98.7 80.0 - 100.0 fL   MCH 32.2 26.0 - 34.0 pg   MCHC 32.6 30.0 - 36.0 g/dL   RDW 27.213.0 53.611.5 - 64.415.5 %   Platelets 241 150 - 400 K/uL   nRBC 0.0 0.0 - 0.2 %    Comment: Performed at [facility], [address] [place], [place]  [address]  Pregnancy, urine POC     Status: None   Collection Time: 06/26/19  6:07 PM  Result Value Ref Range   Preg Test, Ur NEGATIVE NEGATIVE    Comment:        THE SENSITIVITY OF THIS METHODOLOGY IS >24 mIU/mL   Respiratory Panel by RT PCR (Flu A&B, Covid) - Nasopharyngeal Swab     Status: None   Collection Time: 06/26/19  7:29 PM   Specimen: Nasopharyngeal Swab  Result Value Ref Range   SARS Coronavirus 2 by RT PCR NEGATIVE NEGATIVE    Comment: (NOTE) SARS-CoV-2 target nucleic  acids are NOT DETECTED. The SARS-CoV-2 RNA is generally detectable in upper respiratoy specimens during the acute phase of infection. The lowest concentration of SARS-CoV-2 viral copies this assay can detect is 131 copies/mL. A negative result does not preclude SARS-Cov-2 infection and should not be used as the sole basis for treatment or other patient management decisions. A negative result may occur with  improper specimen collection/handling, submission of specimen other than nasopharyngeal swab, presence of viral mutation(s) within the areas targeted by this assay, and inadequate number of viral copies (<131 copies/mL). A negative result must be combined with clinical observations, patient history, and epidemiological information. The expected result is Negative. Fact Sheet for Patients:  PinkCheek.be Fact Sheet for Healthcare Providers:  GravelBags.it This test is not yet ap proved or cleared by the [place] FDA and  has been authorized for detection and/or diagnosis of SARS-CoV-2 by FDA under an Emergency Use Authorization (EUA). This EUA will remain  in effect (meaning this test can be used) for the duration of the COVID-19 declaration under Section 564(b)(1) of the Act, 21 U.S.C. section 360bbb-3(b)(1), unless the authorization is terminated or revoked sooner.    Influenza A by PCR NEGATIVE NEGATIVE   Influenza B by PCR NEGATIVE NEGATIVE    Comment: (NOTE) The Xpert Xpress SARS-CoV-2/FLU/RSV assay is intended as an aid in  the diagnosis of influenza from Nasopharyngeal swab specimens and  should not be used as a sole basis for treatment. Nasal washings and  aspirates are unacceptable for Xpert Xpress SARS-CoV-2/FLU/RSV  testing. Fact Sheet for Patients: PinkCheek.be Fact Sheet for Healthcare Providers: GravelBags.it This test is not yet approved or  cleared by the [place] FDA and  has been authorized for detection and/or diagnosis of SARS-CoV-2 by  FDA under an Emergency Use Authorization (EUA). This EUA will remain  in effect (meaning this test can be used) for the duration of the  Covid-19 declaration under Section 564(b)(1) of the Act, 21  U.S.C. section 360bbb-3(b)(1), unless the authorization is  terminated or revoked. Performed at [facility], [address] [place], [place] [address]     No current facility-administered medications for this encounter.   Current Outpatient Medications  Medication Sig Dispense Refill  . meclizine (ANTIVERT) 12.5 MG tablet Take 1 tablet (12.5 mg total) by mouth 3 (three) times daily as needed for dizziness. 30 tablet 0    Musculoskeletal: Strength & Muscle Tone: within normal limits Gait & Station: normal Patient leans: Right and [name]  Psychiatric Specialty Exam: Physical Exam  Nursing note and vitals reviewed. Constitutional: She is oriented to person, place, and time. She appears well-developed.  Cardiovascular: Normal rate.  Respiratory: Effort normal.  Musculoskeletal:        General: Normal range of motion.     Cervical back: Normal range of motion and neck supple.  Neurological: She is alert and oriented  to person, place, and time.  Psychiatric: Her behavior is normal.    Review of Systems  Psychiatric/Behavioral: Positive for suicidal ideas. The patient is nervous/anxious.   All other systems reviewed and are negative.   Blood pressure 112/72, pulse 71, temperature 99.1 F (37.3 C), temperature source Oral, resp. rate 16, height 6\' 1"  (1.854 m), weight 72.6 kg, last menstrual period 06/18/2019, SpO2 100 %, unknown if currently breastfeeding.Body mass index is 21.11 kg/m.  General Appearance: Casual  Eye Contact:  Minimal  Speech:  Clear and Coherent  Volume:  Decreased  Mood:  Depressed  Affect:  Congruent  Thought Process:  Coherent  Orientation:   Full (Time, Place, and Person)  Thought Content:  Logical  Suicidal Thoughts:  Yes.  without intent/plan  Homicidal Thoughts:  No  Memory:  Immediate;   Good Recent;   Good Remote;   Good  Judgement:  Fair  Insight:  Lacking  Psychomotor Activity:  Normal  Concentration:  Concentration: Good and Attention Span: Good  Recall:  Good  Fund of Knowledge:  Fair  Language:  Good  Akathisia:  Negative  Handed:  Right  AIMS (if indicated):     Assets:  Communication Skills Desire for Improvement Housing Social Support  ADL's:  Intact  Cognition:  WNL  Sleep:   Good   "  Treatment Plan Summary: This is a 24 year old patient with a history of mental illness who had multiple ER visits in the past several days due to social stressors including homelessness.  Patient currently denying any suicidal or homicidal ideations and although has a history of psychotic symptoms is not currently showing any psychosis.  She is requesting to be discharged, IVC will be rescinded.  Patient will be given outpatient resources.  Diagnosis: Adjustment disorder   Disposition: No evidence of imminent risk to self or others at present.   Patient does not meet criteria for psychiatric inpatient admission. Supportive therapy provided about ongoing stressors. Discussed crisis plan, support from social network, calling 911, coming to the Emergency Department, and calling Suicide Hotline.  Clement Sayres, MD 06/27/2019 11:34 AM

## 2019-06-27 NOTE — Discharge Instructions (Addendum)
Follow-up with your regular doctor and psychiatrist.  Return to the ER for any new or worsening symptoms that concern you. 

## 2019-07-03 ENCOUNTER — Emergency Department (HOSPITAL_COMMUNITY)
Admission: EM | Admit: 2019-07-03 | Discharge: 2019-07-04 | Disposition: A | Discharge status: Home / Self Care | Payer: Self-pay | Attending: Emergency Medicine | Admitting: Emergency Medicine

## 2019-07-03 ENCOUNTER — Encounter (HOSPITAL_COMMUNITY): Payer: Self-pay | Admitting: Licensed Clinical Social Worker

## 2019-07-03 DIAGNOSIS — F259 Schizoaffective disorder, unspecified: Secondary | ICD-10-CM | POA: Insufficient documentation

## 2019-07-03 DIAGNOSIS — Z79899 Other long term (current) drug therapy: Secondary | ICD-10-CM | POA: Insufficient documentation

## 2019-07-03 DIAGNOSIS — Z20822 Contact with and (suspected) exposure to covid-19: Secondary | ICD-10-CM | POA: Insufficient documentation

## 2019-07-03 DIAGNOSIS — F1910 Other psychoactive substance abuse, uncomplicated: Secondary | ICD-10-CM

## 2019-07-03 DIAGNOSIS — F1210 Cannabis abuse, uncomplicated: Secondary | ICD-10-CM | POA: Insufficient documentation

## 2019-07-03 DIAGNOSIS — R4689 Other symptoms and signs involving appearance and behavior: Secondary | ICD-10-CM

## 2019-07-03 DIAGNOSIS — R45851 Suicidal ideations: Secondary | ICD-10-CM | POA: Insufficient documentation

## 2019-07-03 DIAGNOSIS — F1914 Other psychoactive substance abuse with psychoactive substance-induced mood disorder: Secondary | ICD-10-CM | POA: Insufficient documentation

## 2019-07-03 DIAGNOSIS — F1410 Cocaine abuse, uncomplicated: Secondary | ICD-10-CM | POA: Insufficient documentation

## 2019-07-03 DIAGNOSIS — F319 Bipolar disorder, unspecified: Secondary | ICD-10-CM | POA: Insufficient documentation

## 2019-07-03 DIAGNOSIS — F419 Anxiety disorder, unspecified: Secondary | ICD-10-CM | POA: Insufficient documentation

## 2019-07-03 DIAGNOSIS — F1994 Other psychoactive substance use, unspecified with psychoactive substance-induced mood disorder: Secondary | ICD-10-CM | POA: Diagnosis present

## 2019-07-03 DIAGNOSIS — F1510 Other stimulant abuse, uncomplicated: Secondary | ICD-10-CM | POA: Insufficient documentation

## 2019-07-03 LAB — CBC
HCT: 39.3 % (ref 36.0–46.0)
Hemoglobin: 12.9 g/dL (ref 12.0–15.0)
MCH: 32.2 pg (ref 26.0–34.0)
MCHC: 32.8 g/dL (ref 30.0–36.0)
MCV: 98 fL (ref 80.0–100.0)
Platelets: 233 10*3/uL (ref 150–400)
RBC: 4.01 MIL/uL (ref 3.87–5.11)
RDW: 12.4 % (ref 11.5–15.5)
WBC: 10.6 10*3/uL — ABNORMAL HIGH (ref 4.0–10.5)
nRBC: 0 % (ref 0.0–0.2)

## 2019-07-03 LAB — COMPREHENSIVE METABOLIC PANEL
ALT: 45 U/L — ABNORMAL HIGH (ref 0–44)
AST: 70 U/L — ABNORMAL HIGH (ref 15–41)
Albumin: 3.7 g/dL (ref 3.5–5.0)
Alkaline Phosphatase: 53 U/L (ref 38–126)
Anion gap: 14 (ref 5–15)
BUN: 11 mg/dL (ref 6–20)
CO2: 23 mmol/L (ref 22–32)
Calcium: 8.8 mg/dL — ABNORMAL LOW (ref 8.9–10.3)
Chloride: 102 mmol/L (ref 98–111)
Creatinine, Ser: 1.21 mg/dL — ABNORMAL HIGH (ref 0.44–1.00)
GFR calc Af Amer: >60 mL/min (ref >60)
GFR calc non Af Amer: >60 mL/min (ref >60)
Glucose, Bld: 66 mg/dL — ABNORMAL LOW (ref 70–99)
Potassium: 3.6 mmol/L (ref 3.5–5.1)
Sodium: 139 mmol/L (ref 135–145)
Total Bilirubin: 1.2 mg/dL (ref 0.3–1.2)
Total Protein: 7.4 g/dL (ref 6.5–8.1)

## 2019-07-03 LAB — ETHANOL: Alcohol, Ethyl (B): <10 mg/dL (ref <10)

## 2019-07-03 LAB — ACETAMINOPHEN LEVEL: Acetaminophen (Tylenol), Serum: <10 ug/mL — ABNORMAL LOW (ref 10–30)

## 2019-07-03 LAB — I-STAT BETA HCG BLOOD, ED (MC, WL, AP ONLY): I-stat hCG, quantitative: <5 m[IU]/mL (ref <5)

## 2019-07-03 LAB — SALICYLATE LEVEL: Salicylate Lvl: <7 mg/dL — ABNORMAL LOW (ref 7.0–30.0)

## 2019-07-03 LAB — RESPIRATORY PANEL BY RT PCR (FLU A&B, COVID)
Influenza A by PCR: NEGATIVE
Influenza B by PCR: NEGATIVE
SARS Coronavirus 2 by RT PCR: NEGATIVE

## 2019-07-03 MED ORDER — DROPERIDOL 2.5 MG/ML IJ SOLN
5.0000 mg | Freq: Once | INTRAMUSCULAR | Status: AC
  Administered 2019-07-03: 14:00:00 5 mg via INTRAMUSCULAR
  Filled 2019-07-03: qty 2

## 2019-07-03 MED ORDER — SODIUM CHLORIDE 0.9 % IV BOLUS
1000.0000 mL | Freq: Once | INTRAVENOUS | Status: AC
  Administered 2019-07-03: 1000 mL via INTRAVENOUS

## 2019-07-03 MED ORDER — DIPHENHYDRAMINE HCL 50 MG/ML IJ SOLN
50.0000 mg | Freq: Once | INTRAMUSCULAR | Status: AC
  Administered 2019-07-03: 50 mg via INTRAMUSCULAR
  Filled 2019-07-03: qty 1

## 2019-07-03 NOTE — ED Provider Notes (Addendum)
[facility] EMERGENCY DEPARTMENT Provider Note   CSN: [id number] Arrival date & time: 07/03/19  1342     History Chief Complaint  Patient presents with  . Psychiatric Evaluation    Amy [name] is a 24 y.o. female presenting for psychiatric evaluation.  Level 5 caveat due to psychiatric disorder.  History obtained from [employer].  Per GPT, they were called to a hotel for patient being aggressive and yelling.  Upon arrival, patient was speaking incoherently.  She was aggressive.  No other history was able to be obtained on scene.   Additional history obtained from chart review.  Patient with a history of polysubstance abuse including cocaine and amphetamines, schizoaffective disorder, and bipolar.  She has had multiple visits to the ED in the past 2 months for a variety of reasons, mostly including homelessness, mania/agitation, polysubstance abuse, SI, and hallucinations.  HPI     Past Medical History:  Diagnosis Date  . Anxiety   . Asthma   . Bipolar disorder (HCC)   . Cannabis abuse   . Depression   . 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 Schizo affective schizophrenia [facility])     Patient Active Problem List   Diagnosis Date Noted  . Schizophrenia (HCC) 10/28/2018  . Schizoaffective psychosis (HCC) 05/18/2018  . Cannabis abuse 05/04/2018  . 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 Substance induced mood disorder (HCC) 11/14/2017  . 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 Substance abuse (HCC)   . Schizoaffective disorder, bipolar type (HCC) 04/22/2017  . Cocaine abuse with cocaine-induced mood disorder (HCC) 12/16/2016  . Bipolar disorder, curr episode mixed, severe, with psychotic features (HCC) 10/25/2016  . Cocaine use disorder, mild, abuse (HCC) 10/25/2016  . Cannabis use disorder, severe, dependence (HCC) 10/25/2016  . Insomnia   . Anxiety state   . Cocaine abuse (HCC) 07/04/2015  . Cannabis abuse, continuous  07/02/2015  . Non compliance with medical treatment 06/18/2015  . Bipolar I disorder, current or most recent episode manic, with psychotic features (HCC) 03/02/2015    No past surgical history on file.   OB History    Gravida  1   Para      Term      Preterm      AB      Living        SAB      TAB      Ectopic      Multiple      Live Births              Family History  Problem Relation Age of Onset  . Mental illness Neg Hx     Social History   Tobacco Use  . Smoking status: Current Some Day Smoker    Packs/day: 0.50    Types: Cigarettes  . Smokeless tobacco: Never Used  Substance Use Topics  . Alcohol use: Not Currently  . Drug use: Yes    Types: Marijuana    Home Medications Prior to Admission medications   Medication Sig Start Date End Date Taking? Authorizing Provider  meclizine (ANTIVERT) 12.5 MG tablet Take 1 tablet (12.5 mg total) by mouth 3 (three) times daily as needed for dizziness. 06/26/19   Mesner, Barbara Cower, MD  amantadine (SYMMETREL) 100 MG capsule Take 1 capsule (100 mg total) by mouth 2 (two) times daily. Patient not taking: Reported on 06/19/2019 05/24/19 06/26/19  [name], FNP  ARIPiprazole (ABILIFY) 5 MG tablet Take 1  tablet (5 mg total) by mouth daily. Patient not taking: Reported on 06/19/2019 05/25/19 06/26/19  [name], FNP  carbamazepine (TEGRETOL) 100 MG chewable tablet Chew 1 tablet (100 mg total) by mouth 2 (two) times daily. Patient not taking: Reported on 06/19/2019 05/24/19 06/26/19  [name], FNP  fluPHENAZine (PROLIXIN) 10 MG tablet Take 2 tablets (20 mg total) by mouth at bedtime. Patient not taking: Reported on 06/19/2019 05/24/19 06/26/19  [name], FNP    Allergies    Penicillins, Cogentin [benztropine], Divalproex sodium, Risperidone, and Valproic acid  Review of Systems   Review of Systems  Unable to perform ROS: Psychiatric disorder  Psychiatric/Behavioral: Positive for agitation. The patient is hyperactive.      Physical Exam Updated Vital Signs Pulse (!) 117   Resp (!) 22   LMP 06/18/2019 (Approximate)   SpO2 97%   Physical Exam Vitals and nursing note reviewed.  Constitutional:      Appearance: She is well-developed.     Comments: Pt agitated  HENT:     Head: Normocephalic and atraumatic.  Eyes:     Extraocular Movements: Extraocular movements intact.  Cardiovascular:     Rate and Rhythm: Tachycardia present.  Pulmonary:     Effort: No respiratory distress.  Abdominal:     General: There is no distension.  Musculoskeletal:        General: Normal range of motion.     Cervical back: Normal range of motion and neck supple.     Comments: Ambulatory.  Moving all extremities without difficulty.  Skin:    General: Skin is warm and dry.     Capillary Refill: Capillary refill takes less than 2 seconds.  Neurological:     Comments: Agitated and aggressive.  Yelling incoherently.  Psychiatric:        Speech: Speech is rapid and pressured and tangential.        Behavior: Behavior is agitated and aggressive.     ED Results / Procedures / Treatments   Labs (all labs ordered are listed, but only abnormal results are displayed) Labs Reviewed  CBC - Abnormal; Notable for the following components:      Result Value   WBC 10.6 (*)    All other components within normal limits  COMPREHENSIVE METABOLIC PANEL - Abnormal; Notable for the following components:   Glucose, Bld 66 (*)    Creatinine, Ser 1.21 (*)    Calcium 8.8 (*)    AST 70 (*)    ALT 45 (*)    All other components within normal limits  ACETAMINOPHEN LEVEL - Abnormal; Notable for the following components:   Acetaminophen (Tylenol), Serum <10 (*)    All other components within normal limits  SALICYLATE LEVEL - Abnormal; Notable for the following components:   Salicylate Lvl <7.0 (*)    All other components within normal limits  ETHANOL  URINALYSIS, ROUTINE W REFLEX MICROSCOPIC  RAPID URINE DRUG SCREEN, HOSP PERFORMED   I-STAT BETA HCG BLOOD, ED ([facility], [facility], [facility] ONLY)    EKG EKG Interpretation  Date/Time:  Thursday July 03 2019 15:22:38 EST Ventricular Rate:  79 PR Interval:    QRS Duration: 100 QT Interval:  381 QTC Calculation: 437 R Axis:   42 Text Interpretation: Sinus rhythm RSR' in V1 or V2, since last tracing no significant change Confirmed by Eber Hong ([id number]) on 07/03/2019 3:27:36 PM   Radiology No results found.  Procedures .Critical Care Performed by: Alveria Apley, PA-C Authorized by: Lisbeth Renshaw  Martha Soltys, PA-C   Critical care provider statement:    Critical care time (minutes):  40   Critical care time was exclusive of:  Separately billable procedures and treating other patients and teaching time   Critical care was necessary to treat or prevent imminent or life-threatening deterioration of the following conditions: pyschosis.   Critical care was time spent personally by me on the following activities:  Blood draw for specimens, development of treatment plan with patient or surrogate, evaluation of patient's response to treatment, examination of patient, obtaining history from patient or surrogate, ordering and performing treatments and interventions, ordering and review of laboratory studies, ordering and review of radiographic studies, re-evaluation of patient's condition, pulse oximetry and review of old charts   I assumed direction of critical care for this patient from another provider in my specialty: no   Comments:     Pt presenting to the ED psychotic and aggressive. requiring IM sedation for pt and staff safety.    (including critical care time)  Medications Ordered in ED Medications  diphenhydrAMINE (BENADRYL) injection 50 mg (50 mg Intramuscular Given 07/03/19 1352)  droperidol (INAPSINE) 2.5 MG/ML injection 5 mg (5 mg Intramuscular Given 07/03/19 1352)    ED Course  I have reviewed the triage vital signs and the nursing notes.  Pertinent labs & imaging results  that were available during my care of the patient were reviewed by me and considered in my medical decision making (see chart for details).    MDM Rules/Calculators/A&P                      Patient presenting for evaluation of psychiatric disorder.  On initial evaluation, patient is yelling incoherently and acting aggressively.  Handcuffed with [employer].  Concern for patient and staff safety, patient given IM Benadryl and droperidol. IVC paperwork obtained for pt safety and due to pt's inability to make decision for herself in this mental state.  Symptoms likely due to polysubstance abuse and/or mental health crisis.  Case discussed with attending, Dr. Sabra Heck evaluated the patient.  On reevaluation about 15 minutes after medication, patient is calming slgihtly, but continues to yell occasionally.  On reevaluation, patient is calm and sleeping.  She will wake up and state her name, and promptly fall back asleep.  Initially patient was tachycardic, this improved this patient became less agitated.  Clear lung sounds in all fields.  No abdominal tenderness. Will obtain medical clearance labs and ekg. Once cleared, pt to be assessed by TTS.   Labs interpreted by me.  Overall reassuring.  Mild elevation in creatinine and LFTs, likely due to dehydration and drug use.  Will give fluids. EKG unchanged from previous.  Patient is medically cleared at this time for TTS evaluation.  Final Clinical Impression(s) / ED Diagnoses Final diagnoses:  Aggressive behavior    Rx / DC Orders ED Discharge Orders    None       Franchot Heidelberg, PA-C 07/03/19 [place], Auria Mckinlay, PA-C 07/03/19 1605    Noemi Chapel, MD 07/08/19 [phone]

## 2019-07-03 NOTE — ED Notes (Signed)
Per sitter, pt states that she has been having thoughts about wanting to harm others

## 2019-07-03 NOTE — ED Notes (Signed)
Pt lying in bed crying but calm, still not cooperative enough to safely obtain blood samples and other tests at this time. No longer wearing hand cuffs.

## 2019-07-03 NOTE — ED Triage Notes (Signed)
Pt picked up by [employer] outside [facility], manic, incoherent, and belligerent. Unable to gather any other information.

## 2019-07-03 NOTE — ED Notes (Signed)
Pt is changed into burgundy scrubs and is on list to be wanded by security.

## 2019-07-03 NOTE — ED Notes (Signed)
After admin of medication, provided water per pt request. Pt is now lying down in bed, awake but no longer yelling and hostile.

## 2019-07-03 NOTE — ED Notes (Signed)
Pt resting calmly in bed.  

## 2019-07-03 NOTE — ED Provider Notes (Signed)
Care assumed from [facility]. See note for full HPI.  In summation, 24 year old female arrived to ED via [employer].  [employer] called out for patient being aggressive and yelling.  Upon arrival patient was incoherent and aggressive.  Per chart review patient with history of polysubstance abuse including cocaine, amphetamines.  Patient with history of schizoaffective disorder and bipolar.  She has had multiple visits to the emergency department for the last 2 months.  Patient given medications for aggression.  An IVC.    She is required multiple medications for sedation for patient and staff safety.  Per previous provider patient actively psychotic.  Labs obtained by previous provider show mild elevation in creatinine LFTs likely due to dehydration and drug use.  IV fluids were given.  Patient was medically cleared for TTS evaluation by previous provider.    Plan to follow-up on TTS evaluation. Physical Exam  BP (!) 105/59   Pulse 71   Temp 98.9 F (37.2 C) (Oral)   Resp 18   LMP 06/18/2019 (Approximate)   SpO2 98%   Physical Exam Vitals and nursing note reviewed.  Constitutional:      General: She is not in acute distress.    Appearance: She is well-developed. She is not ill-appearing, toxic-appearing or diaphoretic.  HENT:     Head: Atraumatic.  Eyes:     Pupils: Pupils are equal, round, and reactive to light.  Cardiovascular:     Rate and Rhythm: Normal rate and regular rhythm.  Pulmonary:     Effort: Pulmonary effort is normal. No respiratory distress.  Abdominal:     General: There is no distension.     Palpations: Abdomen is soft.  Musculoskeletal:        General: Normal range of motion.     Cervical back: Normal range of motion and neck supple.  Skin:    General: Skin is warm and dry.  Neurological:     Mental Status: She is alert.  Psychiatric:     Comments: Patient refused to answer questions on exam      ED Course/Procedures     Procedures Labs Reviewed  CBC -  Abnormal; Notable for the following components:      Result Value   WBC 10.6 (*)    All other components within normal limits  COMPREHENSIVE METABOLIC PANEL - Abnormal; Notable for the following components:   Glucose, Bld 66 (*)    Creatinine, Ser 1.21 (*)    Calcium 8.8 (*)    AST 70 (*)    ALT 45 (*)    All other components within normal limits  ACETAMINOPHEN LEVEL - Abnormal; Notable for the following components:   Acetaminophen (Tylenol), Serum <10 (*)    All other components within normal limits  SALICYLATE LEVEL - Abnormal; Notable for the following components:   Salicylate Lvl <7.0 (*)    All other components within normal limits  RESPIRATORY PANEL BY RT PCR (FLU A&B, COVID)  ETHANOL  URINALYSIS, ROUTINE W REFLEX MICROSCOPIC  RAPID URINE DRUG SCREEN, HOSP PERFORMED  I-STAT BETA HCG BLOOD, ED ([facility], [facility], [facility] ONLY)   MDM  24 year old sent in for evaluation of acute psychosis.  Patient requiring multiple medications for sedation for patient and staff safety.  Patient medically cleared for TTS.  Plan to follow-up on TTS evaluation.  Patient IVC at this time.  Went to assess patient, sleeping however arousable to voice.  She refuses to answer questions however according to sitter patient finished speaking  with [place].  She moves all 4 extremities without difficulty.  Awaiting disposition per TTS.  Psychiatry with recommendation for observation overnight at [facility]. They will place bed assignment when bed available. COVID negative in ED. Patient continues to rest comfortably in the ED without any new agitation.      Daya Dutt A, PA-C 07/03/19 2225    Fredia Sorrow, MD 07/03/19 2249

## 2019-07-03 NOTE — ED Notes (Signed)
Ordered diet tray 

## 2019-07-03 NOTE — BH Assessment (Signed)
Assessment Note  Amy [name] is an 24 y.o. female with a history of anxiety, Bipolar Disorder, Cannabis abuse, Depression, Methamphetamine abuse, and Schizoaffective. Patient presents to [facility] with [facility]. They were reportedly called to a hotel for a patient being aggressive and yelling Upon arrival, patient was speaking incoherently. Upon assessing patient she appear drowsy and agitated. She stated that she brought to [facility] to get treated for her Bipolar. Patient does not have an outpatient provider nor does she take any psychotropic medications. She states, "I just came to sleep and get some rest". Patient growing increasingly irritated during the assessment and attempted to lay down several times. When awakened to complete the assessment she was rocking back and forth. She denies SI. However, reports several prior attempt of trying to stabb herself. The refused to explain what precipitated the prior attempts. She denied a history of self mutilating behaviors. She denied HI. She reports auditory hallucinations but would not provide any specific details. She reports visual hallucinations of "my friends step father". She reports no sleep in several days. Also, loss of appetite. She reports extreme weight loss but doesn't know how much in what period of time. She is currently homeless and unable to name any supports. She is currently homeless and identifies this as her stressor. Patient asked about substance use and she denies. During the time of the TTS assessment she had not drug screening results in Epic to view. Counselor asked patient about her history of use and she denied despite and extensive history noted in Epic.Patient has had multiple visits to the ED in the past 2 months for a variety of reasons, mostly including homelessness, mania, agitation, polysubstance abuse, SI, and hallucinations.   Diagnosis: Schizoaffective Disorder, Bipolar Disorder, Anxiety Disorder, Cannabis Abuse, Methamphetamine Use.    Past Medical History:  Past Medical History:  Diagnosis Date  . Anxiety   . Asthma   . Bipolar disorder ([place])   . Cannabis abuse   . Depression   . Methamphetamine abuse ([place])   . Schizo affective schizophrenia ([place])     No past surgical history on file.  Family History:  Family History  Problem Relation Age of Onset  . Mental illness Neg Hx     Social History:  reports that she has been smoking cigarettes. She has been smoking about 0.50 packs per day. She has never used smokeless tobacco. She reports previous alcohol use. She reports current drug use. Drug: Marijuana.  Additional Social History:  Alcohol / Drug Use Pain Medications: SEE MAR Prescriptions: SEE MAR Over the Counter: SEE MAR History of alcohol / drug use?: Yes Substance #1 Name of Substance 1: Cocaine, per history in EPIC; patient denied during TTS assessment. Substance #2 Name of Substance 2: THC, per history in EPIC; patient denied during TTS assessment. Substance #3 Name of Substance 3: Amphetamines, per history in EPIC; patient denied during TTS assessment.  CIWA: CIWA-Ar BP: 109/60 Pulse Rate: 63 COWS:    Allergies:  Allergies  Allergen Reactions  . Penicillins Rash    Has patient had a PCN reaction causing immediate rash, facial/tongue/throat swelling, SOB or lightheadedness with hypotension: Yes Has patient had a PCN reaction causing severe rash involving mucus membranes or skin necrosis: No Has patient had a PCN reaction that required hospitalization: No Has patient had a PCN reaction occurring within the last 10 years: Yes If all of the above answers are "NO", then may   . Cogentin [Benztropine] Itching  . Divalproex Sodium Other (  See Comments)    Creates feelings of paranoia, some suicidal feelings, and makes her feel that "people are coming after" her Creates feelings of paranoia, some suicidal feelings, and makes her feel that "people are coming after" her  . Risperidone Other (See  Comments)    "Makes me cough"  . Valproic Acid Other (See Comments)    Creates paranoia/Per [facility] Creates paranoia/Per [facility]    Home Medications: (Not in a hospital admission)   OB/GYN Status:  Patient's last menstrual period was 06/18/2019 (approximate).  General Assessment Data Location of Assessment: [facility] ED TTS Assessment: In system Is this a Tele or Face-to-Face Assessment?: Face-to-Face Is this an Initial Assessment or a Re-assessment for this encounter?: Initial Assessment Patient Accompanied by:: [name] Language Other than English: No Living Arrangements: Other (Comment) What gender do you identify as?: Female Marital status: Single Maiden name: Network engineer) Pregnancy Status: Unknown Living Arrangements: Other (Comment) Can pt return to current living arrangement?: Yes Admission Status: Involuntary Petitioner: Police Is patient capable of signing voluntary admission?: Yes Referral Source: Self/Family/Friend Insurance type: (None Reported)  Medical Screening Exam ([facility] Walk-in ONLY) Medical Exam completed: Yes  Crisis Care Plan Living Arrangements: Other (Comment) Legal Guardian: (no legal guardian ) Name of Psychiatrist: none Name of Therapist: none  Education Status Is patient currently in school?: No Is the patient employed, unemployed or receiving disability?: Unemployed  Risk to self with the past 6 months Suicidal Ideation: No Suicidal Intent: No Has patient had any suicidal intent within the past 6 months prior to admission? : No Is patient at risk for suicide?: No Suicidal Plan?: No Access to Means: No What has been your use of drugs/alcohol within the last 12 months?: (pt has a hx of THC, cocaine, & amphetamines; but denies) Previous Attempts/Gestures: Yes How many times?: (1) Other Self Harm Risks: (Substance Abuse) Triggers for Past Attempts: None known Intentional Self Injurious Behavior: None Family Suicide History: No Recent  stressful life event(s): Other (Comment)(conflict) Persecutory voices/beliefs?: No Depression: Yes Depression Symptoms: Loss of interest in usual pleasures, Isolating Substance abuse history and/or treatment for substance abuse?: Yes Suicide prevention information given to non-admitted patients: Not applicable  Risk to Others within the past 6 months Homicidal Ideation: No Thoughts of Harm to Others: No Current Homicidal Intent: No Current Homicidal Plan: No Access to Homicidal Means: No Identified Victim: (n/a) History of harm to others?: No Assessment of Violence: None Noted Violent Behavior Description: (n/a) Does patient have access to weapons?: No Criminal Charges Pending?: No Describe Pending Criminal Charges: (none reported) Does patient have a court date: No Is patient on probation?: Unknown  Psychosis Hallucinations: None noted Delusions: Erotomanic  Mental Status Report Appearance/Hygiene: In scrubs Eye Contact: Poor Motor Activity: Agitation Speech: Logical/coherent Level of Consciousness: Quiet/awake, Drowsy Mood: Irritable Affect: Irritable, Preoccupied Anxiety Level: Minimal Panic attack frequency: (not currently but reports frequently) Most recent panic attack: (07/03/2019) Thought Processes: Coherent, Relevant Judgement: Unimpaired Orientation: Person, Place, Time, Situation, Appropriate for developmental age Obsessive Compulsive Thoughts/Behaviors: None  Cognitive Functioning Concentration: Fair Memory: Recent Intact, Remote Intact Is patient IDD: No Insight: Poor Impulse Control: Fair Appetite: Good Have you had any weight changes? : No Change Sleep: Decreased Total Hours of Sleep: (7) Vegetative Symptoms: None  ADLScreening [facility] Assessment Services) Patient's cognitive ability adequate to safely complete daily activities?: Yes Patient able to express need for assistance with ADLs?: Yes Independently performs ADLs?: Yes (appropriate for  developmental age)  Prior Inpatient Therapy Prior Inpatient  Therapy: Yes Prior Therapy Dates: multiple Prior Therapy Facilty/Provider(s): [facility] Reason for Treatment: MH  Prior Outpatient Therapy Prior Outpatient Therapy: No Prior Therapy Dates: unknown Prior Therapy Facilty/Provider(s): [facility] Reason for Treatment: Medication management Does patient have an ACCT team?: No Does patient have Intensive In-House Services?  : No Does patient have [facility] services? : No Does patient have P4CC services?: No  ADL Screening (condition at time of admission) Patient's cognitive ability adequate to safely complete daily activities?: Yes Is the patient deaf or have difficulty hearing?: No Does the patient have difficulty seeing, even when wearing glasses/contacts?: No Does the patient have difficulty concentrating, remembering, or making decisions?: No Patient able to express need for assistance with ADLs?: Yes Does the patient have difficulty dressing or bathing?: No Independently performs ADLs?: Yes (appropriate for developmental age) Does the patient have difficulty walking or climbing stairs?: No Weakness of Legs: None Weakness of Arms/Hands: None  Home Assistive Devices/Equipment Home Assistive Devices/Equipment: None  Therapy Consults (therapy consults require a physician order) PT Evaluation Needed: No OT Evalulation Needed: No SLP Evaluation Needed: No Abuse/Neglect Assessment (Assessment to be complete while patient is alone) Physical Abuse: Denies Verbal Abuse: Denies Sexual Abuse: Denies Exploitation of patient/patient's resources: Denies Self-Neglect: Denies Values / Beliefs Cultural Requests During Hospitalization: None Spiritual Requests During Hospitalization: None Consults Spiritual Care Consult Needed: No Transition of Care Team Consult Needed: No Advance Directives (For Healthcare) Does Patient Have a Medical Advance Directive?: No Nutrition Screen- [facility]  Adult/WL/AP Patient's home diet: Regular Has the patient recently lost weight without trying?: No Has the patient been eating poorly because of a decreased appetite?: No Malnutrition Screening Tool Score: 0        Disposition: Per Dareen Piano, FNP, patient meets criteria for observation. AC notified and will assign a bed. [facility] beds are available.  Disposition Initial Assessment Completed for this Encounter: Yes  On Site Evaluation by:   Reviewed with Physician:    Melynda Ripple 07/03/2019 6:40 PM

## 2019-07-03 NOTE — ED Notes (Addendum)
Per previous RN, pt has been wanded, IV removed, is in purple scrubs, sitter at bedside, belongings in locker, paperwork faxed & appropriate copies made.

## 2019-07-03 NOTE — ED Notes (Signed)
Pt made aware of continued need for urine sample

## 2019-07-03 NOTE — ED Provider Notes (Signed)
This patient is a 24 y/o female with multiple mental health problems including substance abuse disorders as well as bipolar and schizophrenia spectrum disordres - she presents manic by [employer] found incoherent and rambling with pressured speech and flights of ideas outside a hotel.  She refuses to answer questions, she is rambling, yelling, screaming things that do not make any sense, appears mildly diaphoretic, appears to be moving all 4 extremities.  This patient is critically ill with an acute psychiatric disorder likely brought on by polysubstance abuse and underlying psychiatric history.  She is requiring medication for her severe agitation, will do Benadryl and droperidol.  May need an antipsychotic if that does not help.  IVC papers will be drawn up as the patient is clearly not able to make medical decisions in this condition and is a danger to herself and others.  Medical screening examination/treatment/procedure(s) were conducted as a shared visit with non-physician practitioner(s) and myself.  I personally evaluated the patient during the encounter.  Clinical Impression:   Final diagnoses:  Aggressive behavior  Polysubstance abuse (HCC)       Eber Hong, MD 07/08/19 [phone]

## 2019-07-04 LAB — URINALYSIS, ROUTINE W REFLEX MICROSCOPIC
Bilirubin Urine: NEGATIVE
Glucose, UA: NEGATIVE mg/dL
Hgb urine dipstick: NEGATIVE
Ketones, ur: 20 mg/dL — AB
Leukocytes,Ua: NEGATIVE
Nitrite: NEGATIVE
Protein, ur: 100 mg/dL — AB
Specific Gravity, Urine: 1.025 (ref 1.005–1.030)
pH: 5 (ref 5.0–8.0)

## 2019-07-04 LAB — RAPID URINE DRUG SCREEN, HOSP PERFORMED
Amphetamines: POSITIVE — AB
Barbiturates: NOT DETECTED
Benzodiazepines: NOT DETECTED
Cocaine: POSITIVE — AB
Opiates: NOT DETECTED
Tetrahydrocannabinol: POSITIVE — AB

## 2019-07-04 NOTE — ED Notes (Signed)
Pt given graham crackers and water.

## 2019-07-04 NOTE — ED Notes (Signed)
Pt.s breakfast has arrived. Pt stated that she did not want to eat. Told pt that her breakfast tray will be on her bedside table if she does decide to eat.

## 2019-07-04 NOTE — ED Notes (Signed)
Pt. Flushed several things into the toliet, including plastic and paper towels. As soon as we got into the room, pt pressed the emergency button on the wall. Asked her both times that she is not allowed to do that. Only response given back was "I know". Waiting on discharge papers, will continue to monitor.

## 2019-07-04 NOTE — ED Notes (Signed)
Pt's IVC rescinded-- able to contract for safety with counselor-- cab voucher given, d/c'd ambulatory to lobby to wait for cab

## 2019-07-04 NOTE — ED Notes (Signed)
Sitter Amy [name] asked to be relieved for lunch break. This NT advised that though not technically qualified to be a sitter, able to relieve Amy [name]. Will continue to monitor pt closely until sitter returns

## 2019-07-04 NOTE — ED Notes (Signed)
Psych here at bedside-- pt is awake now, will give urine sample

## 2019-07-04 NOTE — Discharge Instructions (Addendum)
Stop using drugs. There is information about substance abuse programs both inpatient and outpatient in the paperwork.

## 2019-07-04 NOTE — Consult Note (Signed)
[facility]   Reason for Consult: Psychiatric Evaluation Referring Physician: Dr. Roxan Hockeyobinson  Patient Identification: Amy [name] MRN:  [id number] Principal Diagnosis: Substance induced mood disorder (HCC) Diagnosis:  Principal Problem:   Substance induced mood disorder (HCC)   Total Time spent with patient: 30 minutes  Subjective: "I want to hurt myself." Amy [name] is a 24 y.o. female  Amy [name] is an 24 y.o. female with a history of anxiety, Bipolar Disorder, Cannabis abuse, Depression, Methamphetamine abuse, and Schizoaffective. [facility]. They were reportedly called to a hotel for a patient being aggressive and yelling Upon arrival, patient was speaking incoherently. Upon assessing patient she appear drowsy and agitated. She stated [facility] her B[facility]. Patient does not have an outpatient provider nor does she take any psychotropic medications. She states, "I just came to sleep and get some rest". Patient growing increasingly irritated during the assessment and attempted to lay down several times. When awakened to complete the assessment she was rocking back and forth. She denies SI. However, reports several prior attempt of trying to stabb herself. The refused to explain what precipitated the prior attempts. She denied a history of self mutilating behaviors. She denied HI. She reports auditory hallucinations but would not provide any specific details. She reports visual hallucinations of "my friends step father". She reports no sleep in several days. Also, loss of appetite. She reports extreme weight loss but doesn't know how much in what period of time. She is currently homeless and unable to name any supports. She is currently homeless and identifies this as her stressor. Patient asked about substance use and she denies. During the time of the TTS assessment she had not drug screening results in  Epic to view. Counselor asked patient about her history of use and she denied despite and extensive history noted in Epic.Patient has had multiple visits to the ED in the past 2 months for a variety of reasons, mostly including homelessness, mania, agitation, polysubstance abuse, SI, and hallucinations.   Patient re-assessed this morning. She states she is feeling much better today and is requesting discharged. She states her boyfriend is the reason why she is here and she just needs to leave him alone because he is toxic to her. She reprots staying with a friend who lives on [address] and declined services. She denies any si/hi/avh at this time.   Past Psychiatric History:  Anxiety Bipolar disorder (HCC) Cannabis abuse Depression Methamphetamine abuse (HCC) Schizo affective schizophrenia (HCC)  Risk to Self: Suicidal Ideation: No Suicidal Intent: No Is patient at risk for suicide?: No Suicidal Plan?: No Access to Means: No What has been your use of drugs/alcohol within the last 12 months?: (pt has a hx of THC, cocaine, & amphetamines; but denies) How many times?: (1) Other Self Harm Risks: (Substance Abuse) Triggers for Past Attempts: None known Intentional Self Injurious Behavior: None Risk to Others: Homicidal Ideation: No Thoughts of Harm to Others: No Current Homicidal Intent: No Current Homicidal Plan: No Access to Homicidal Means: No Identified Victim: (n/a) History of harm to others?: No Assessment of Violence: None Noted Violent Behavior Description: (n/a) Does patient have access to weapons?: No Criminal Charges Pending?: No Describe Pending Criminal Charges: (none reported) Does patient have a court date: No Prior Inpatient Therapy: Prior Inpatient Therapy: Yes Prior Therapy Dates: multiple Prior Therapy Facilty/Provider(s): [facility] Reason for Treatment: MH Prior Outpatient Therapy: Prior  Outpatient Therapy: No Prior Therapy Dates: unknown Prior Therapy  Facilty/Provider(s): [facility] Reason for Treatment: Medication management Does patient have an ACCT team?: No Does patient have Intensive In-House Services?  : No Does patient have [facility] services? : No Does patient have P4CC services?: No  Past Medical History:  Past Medical History:  Diagnosis Date  . Anxiety   . Asthma   . Bipolar disorder (HCC)   . Cannabis abuse   . Depression   . 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 Schizo affective schizophrenia (HCC)    No past surgical history on file. Family History:  Family History  Problem Relation Age of Onset  . Mental illness Neg Hx    Family Psychiatric  History:  Social History:  Social History   Substance and Sexual Activity  Alcohol Use Not Currently     Social History   Substance and Sexual Activity  Drug Use Yes  . Types: Marijuana    Social History   Socioeconomic History  . Marital status: Single    Spouse name: Not on file  . Number of children: Not on file  . Years of education: 86  . Highest education level: 12th grade  Occupational History  . Occupation: Unemployed  Tobacco Use  . Smoking status: Current Some Day Smoker    Packs/day: 0.50    Types: Cigarettes  . Smokeless tobacco: Never Used  Substance and Sexual Activity  . Alcohol use: Not Currently  . Drug use: Yes    Types: Marijuana  . Sexual activity: Not Currently    Birth control/protection: None  Other Topics Concern  . Not on file  Social History Narrative   Pt is unemployed.  She stated that she lives in [place].  Due to altered mental status, difficult to obtain history.  Per previous history, Pt lives with her parents.   Social Determinants of Health   Financial Resource Strain:   . Difficulty of Paying Living Expenses: Not on file  Food Insecurity:   . Worried About Programme researcher, broadcasting/film/video in the Last Year: Not on file  . Ran Out of Food in the Last Year: Not on file  Transportation Needs:   . Lack of Transportation  (Medical): Not on file  . Lack of Transportation (Non-Medical): Not on file  Physical Activity:   . Days of Exercise per Week: Not on file  . Minutes of Exercise per Session: Not on file  Stress:   . Feeling of Stress : Not on file  Social Connections:   . Frequency of Communication with Friends and Family: Not on file  . Frequency of Social Gatherings with Friends and Family: Not on file  . Attends Religious Services: Not on file  . Active Member of Clubs or Organizations: Not on file  . Attends Banker Meetings: Not on file  . Marital Status: Not on file   Additional Social History:    Allergies:   Allergies  Allergen Reactions  . Penicillins Rash    Has patient had a PCN reaction causing immediate rash, facial/tongue/throat swelling, SOB or lightheadedness with hypotension: Yes Has patient had a PCN reaction causing severe rash involving mucus membranes or skin necrosis: No Has patient had a PCN reaction that required hospitalization: No Has patient had a PCN reaction occurring within the last 10 years: Yes If all of the above answers are "NO", then may   . Cogentin [Benztropine] Itching  . Divalproex Sodium Other (See Comments)  Creates feelings of paranoia, some suicidal feelings, and makes her feel that "people are coming after" her Creates feelings of paranoia, some suicidal feelings, and makes her feel that "people are coming after" her  . Risperidone Other (See Comments)    "Makes me cough"  . Valproic Acid Other (See Comments)    Creates paranoia/Per [facility] Creates paranoia/Per [facility]    Labs:  Results for orders placed or performed during the hospital encounter of 07/03/19 (from the past 48 hour(s))  CBC     Status: Abnormal   Collection Time: 07/03/19  3:04 PM  Result Value Ref Range   WBC 10.6 (H) 4.0 - 10.5 K/uL   RBC 4.01 3.87 - 5.11 MIL/uL   Hemoglobin 12.9 12.0 - 15.0 g/dL   HCT 05.3 97.6 - 73.4 %   MCV 98.0 80.0 - 100.0  fL   MCH 32.2 26.0 - 34.0 pg   MCHC 32.8 30.0 - 36.0 g/dL   RDW 19.3 79.0 - 24.0 %   Platelets 233 150 - 400 K/uL   nRBC 0.0 0.0 - 0.2 %    Comment: Performed at [facility], [address] [place], [place] [address]  Comprehensive metabolic panel     Status: Abnormal   Collection Time: 07/03/19  3:04 PM  Result Value Ref Range   Sodium 139 135 - 145 mmol/L   Potassium 3.6 3.5 - 5.1 mmol/L   Chloride 102 98 - 111 mmol/L   CO2 23 22 - 32 mmol/L   Glucose, Bld 66 (L) 70 - 99 mg/dL   BUN 11 6 - 20 mg/dL   Creatinine, Ser 2.99 (H) 0.44 - 1.00 mg/dL   Calcium 8.8 (L) 8.9 - 10.3 mg/dL   Total Protein 7.4 6.5 - 8.1 g/dL   Albumin 3.7 3.5 - 5.0 g/dL   AST 70 (H) 15 - 41 U/L   ALT 45 (H) 0 - 44 U/L   Alkaline Phosphatase 53 38 - 126 U/L   Total Bilirubin 1.2 0.3 - 1.2 mg/dL   GFR calc non Af Amer >60 >60 mL/min   GFR calc Af Amer >60 >60 mL/min   Anion gap 14 5 - 15    Comment: Performed at [facility], [address], [place], [place] [address]  Acetaminophen level     Status: Abnormal   Collection Time: 07/03/19  3:04 PM  Result Value Ref Range   Acetaminophen (Tylenol), Serum <10 (L) 10 - 30 ug/mL    Comment: (NOTE) Therapeutic concentrations vary significantly. A range of 10-30 ug/mL  may be an effective concentration for many patients. However, some  are best treated at concentrations outside of this range. Acetaminophen concentrations >150 ug/mL at 4 hours after ingestion  and >50 ug/mL at 12 hours after ingestion are often associated with  toxic reactions. Performed at [facility], [address] [place], [place] [address]   Salicylate level     Status: Abnormal   Collection Time: 07/03/19  3:04 PM  Result Value Ref Range   Salicylate Lvl <7.0 (L) 7.0 - 30.0 mg/dL    Comment: Performed at [facility], [address], [place], [place] [address]  Ethanol     Status: None   Collection Time: 07/03/19  3:04 PM  Result Value Ref Range   Alcohol,  Ethyl (B) <10 <10 mg/dL    Comment: (NOTE) Lowest detectable limit for serum alcohol is 10 mg/dL. For medical purposes  only. Performed at [facility], [address], [place], [place] [address]   I-Stat Beta hCG blood, ED ([facility], [facility], [facility] only)     Status: None   Collection Time: 07/03/19  3:07 PM  Result Value Ref Range   I-stat hCG, quantitative <5.0 <5 mIU/mL   Comment 3            Comment:   GEST. AGE      CONC.  (mIU/mL)   <=1 WEEK        5 - 50     2 WEEKS       50 - 500     3 WEEKS       100 - 10,000     4 WEEKS     1,000 - 30,000        FEMALE AND NON-PREGNANT FEMALE:     LESS THAN 5 mIU/mL   Respiratory Panel by RT PCR (Flu A&B, Covid) - Nasopharyngeal Swab     Status: None   Collection Time: 07/03/19  4:43 PM   Specimen: Nasopharyngeal Swab  Result Value Ref Range   SARS Coronavirus 2 by RT PCR NEGATIVE NEGATIVE    Comment: (NOTE) SARS-CoV-2 target nucleic acids are NOT DETECTED. The SARS-CoV-2 RNA is generally detectable in upper respiratoy specimens during the acute phase of infection. The lowest concentration of SARS-CoV-2 viral copies this assay can detect is 131 copies/mL. A negative result does not preclude SARS-Cov-2 infection and should not be used as the sole basis for treatment or other patient management decisions. A negative result may occur with  improper specimen collection/handling, submission of specimen other than nasopharyngeal swab, presence of viral mutation(s) within the areas targeted by this assay, and inadequate number of viral copies (<131 copies/mL). A negative result must be combined with clinical observations, patient history, and epidemiological information. The expected result is Negative. Fact Sheet for Patients:  https://www.moore.com/ Fact Sheet for Healthcare Providers:  https://www.young.biz/ This test is not yet ap proved or cleared by the [place] FDA and  has been authorized for  detection and/or diagnosis of SARS-CoV-2 by FDA under an Emergency Use Authorization (EUA). This EUA will remain  in effect (meaning this test can be used) for the duration of the COVID-19 declaration under Section 564(b)(1) of the Act, 21 U.S.C. section 360bbb-3(b)(1), unless the authorization is terminated or revoked sooner.    Influenza A by PCR NEGATIVE NEGATIVE   Influenza B by PCR NEGATIVE NEGATIVE    Comment: (NOTE) The Xpert Xpress SARS-CoV-2/FLU/RSV assay is intended as an aid in  the diagnosis of influenza from Nasopharyngeal swab specimens and  should not be used as a sole basis for treatment. Nasal washings and  aspirates are unacceptable for Xpert Xpress SARS-CoV-2/FLU/RSV  testing. Fact Sheet for Patients: https://www.moore.com/ Fact Sheet for Healthcare Providers: https://www.young.biz/ This test is not yet approved or cleared by the [place] FDA and  has been authorized for detection and/or diagnosis of SARS-CoV-2 by  FDA under an Emergency Use Authorization (EUA). This EUA will remain  in effect (meaning this test can be used) for the duration of the  Covid-19 declaration under Section 564(b)(1) of the Act, 21  U.S.C. section 360bbb-3(b)(1), unless the authorization is  terminated or revoked. Performed at [facility], [address], [place], [place] [address]   Rapid urine drug screen (hospital performed)     Status: Abnormal   Collection Time: 07/04/19  9:07 AM  Result Value Ref Range  Opiates NONE DETECTED NONE DETECTED   Cocaine POSITIVE (A) NONE DETECTED   Benzodiazepines NONE DETECTED NONE DETECTED   Amphetamines POSITIVE (A) NONE DETECTED   Tetrahydrocannabinol POSITIVE (A) NONE DETECTED   Barbiturates NONE DETECTED NONE DETECTED    Comment: (NOTE) DRUG SCREEN FOR MEDICAL PURPOSES ONLY.  IF CONFIRMATION IS NEEDED FOR ANY PURPOSE, NOTIFY LAB WITHIN 5 DAYS. LOWEST DETECTABLE LIMITS FOR URINE DRUG  SCREEN Drug Class                     Cutoff (ng/mL) Amphetamine and metabolites    1000 Barbiturate and metabolites    200 Benzodiazepine                 341 Tricyclics and metabolites     300 Opiates and metabolites        300 Cocaine and metabolites        300 THC                            50 Performed at [facility], [place] [address], [place], [place] [address]   Urinalysis, Routine w reflex microscopic     Status: Abnormal   Collection Time: 07/04/19  9:08 AM  Result Value Ref Range   Color, Urine AMBER (A) YELLOW    Comment: BIOCHEMICALS MAY BE AFFECTED BY COLOR   APPearance HAZY (A) CLEAR   Specific Gravity, Urine 1.025 1.005 - 1.030   pH 5.0 5.0 - 8.0   Glucose, UA NEGATIVE NEGATIVE mg/dL   Hgb urine dipstick NEGATIVE NEGATIVE   Bilirubin Urine NEGATIVE NEGATIVE   Ketones, ur 20 (A) NEGATIVE mg/dL   Protein, ur 100 (A) NEGATIVE mg/dL   Nitrite NEGATIVE NEGATIVE   Leukocytes,Ua NEGATIVE NEGATIVE   RBC / HPF 6-10 0 - 5 RBC/hpf   WBC, UA 11-20 0 - 5 WBC/hpf   Bacteria, UA MANY (A) NONE SEEN   Squamous Epithelial / LPF 0-5 0 - 5   Mucus PRESENT    Granular Casts, UA PRESENT     Comment: Performed at [facility], [address] [place], [place] [address]    No current facility-administered medications for this encounter.   Current Outpatient Medications  Medication Sig Dispense Refill  . meclizine (ANTIVERT) 12.5 MG tablet Take 1 tablet (12.5 mg total) by mouth 3 (three) times daily as needed for dizziness. 30 tablet 0    Musculoskeletal: Strength & Muscle Tone: within normal limits Gait & Station: normal Patient leans: Right and [name]  Psychiatric Specialty Exam: Physical Exam  Nursing note and vitals reviewed. Constitutional: She is oriented to person, place, and time. She appears well-developed.  Cardiovascular: Normal rate.  Respiratory: Effort normal.  Musculoskeletal:        General: Normal range of motion.     Cervical back: Normal range  of motion and neck supple.  Neurological: She is alert and oriented to person, place, and time.  Psychiatric: Her behavior is normal.    Review of Systems  Psychiatric/Behavioral: Positive for suicidal ideas. The patient is nervous/anxious.   All other systems reviewed and are negative.   Blood pressure (!) 92/52, pulse (!) 57, temperature 98 F (36.7 C), temperature source Oral, resp. rate 19, last menstrual period 06/18/2019, SpO2 97 %, unknown if currently breastfeeding.There is no height or weight on file to calculate BMI.  General Appearance: Casual  Eye Contact:  Fair  Speech:  Clear  and Coherent and Normal Rate  Volume:  Normal  Mood:  Euthymic  Affect:  Appropriate and Congruent  Thought Process:  Coherent, Linear and Descriptions of Associations: Intact  Orientation:  Full (Time, Place, and Person)  Thought Content:  Logical  Suicidal Thoughts:  No  Homicidal Thoughts:  No  Memory:  Immediate;   Fair Recent;   Fair Remote;   Fair  Judgement:  Intact  Insight:  Lacking  Psychomotor Activity:  Normal  Concentration:  Concentration: Fair and Attention Span: Fair  Recall:  [employer] of Knowledge:  Fair  Language:  Fair  Akathisia:  No  Handed:  Right  AIMS (if indicated):     Assets:  Communication Skills Desire for Improvement Housing Social Support  ADL's:  Intact  Cognition:  WNL  Sleep:   Good   "  Treatment Plan Summary: This is a 25 year old patient with a history of mental illness who had multiple ER visits in the past several days due to social stressors including homelessness.  Patient currently denying any suicidal or homicidal ideations and although has a history of psychotic symptoms is not currently showing any psychosis.  She is requesting to be discharged, IVC will be rescinded.     Disposition: No evidence of imminent risk to self or others at present.   Patient does not meet criteria for psychiatric inpatient admission. Supportive therapy  provided about ongoing stressors. Discussed crisis plan, support from social network, calling 911, coming to the Emergency Department, and calling Suicide Hotline.  Maryagnes Amos, FNP 07/04/2019 10:11 AM

## 2019-07-04 NOTE — ED Notes (Signed)
Pt. Spoke on the phone, using RN's phone. Phone given back to the front desk.

## 2019-07-04 NOTE — ED Notes (Signed)
Breakfast Ordered 

## 2019-07-04 NOTE — ED Notes (Signed)
Lunch ordered 

## 2019-07-04 NOTE — ED Notes (Signed)
Pt. Walked to the bathroom without any difficulty. Pt all of a sudden began asking if she could be discharged, because she has something to do that is very important. Pt caught the RN's attention as we were walking to their room, told RN. Will continue to monitor pt.
